# Patient Record
Sex: Female | Born: 1970 | Race: Black or African American | Hispanic: No | Marital: Married
Health system: Northeastern US, Academic
[De-identification: ages and names within clinical notes are randomized; demographics above are authoritative.]

## PROBLEM LIST (undated history)

## (undated) DIAGNOSIS — N3010 Interstitial cystitis (chronic) without hematuria: Secondary | ICD-10-CM

## (undated) DIAGNOSIS — E559 Vitamin D deficiency, unspecified: Secondary | ICD-10-CM

## (undated) DIAGNOSIS — R51 Headache: Secondary | ICD-10-CM

## (undated) DIAGNOSIS — R011 Cardiac murmur, unspecified: Secondary | ICD-10-CM

## (undated) DIAGNOSIS — D573 Sickle-cell trait: Secondary | ICD-10-CM

## (undated) DIAGNOSIS — I10 Essential (primary) hypertension: Secondary | ICD-10-CM

## (undated) HISTORY — PX: BARTHOLIN GLAND CYST EXCISION: SHX565

## (undated) HISTORY — PX: DILATION AND CURETTAGE OF UTERUS: SHX78

## (undated) HISTORY — PX: EYE SURGERY: SHX253

## (undated) HISTORY — PX: ABDOMINAL HYSTERECTOMY: SHX81

## (undated) HISTORY — PX: COLONOSCOPY: SHX174

## (undated) HISTORY — PX: CYSTECTOMY: SUR359

## (undated) HISTORY — PX: UPPER GI ENDOSCOPY: SHX6162

## (undated) HISTORY — PX: INGUINAL HERNIA REPAIR: SUR1180

## (undated) HISTORY — PX: ANKLE SURGERY: SHX546

## (undated) HISTORY — PX: LAPAROSCOPY: SHX197

---

## 1997-09-27 ENCOUNTER — Other Ambulatory Visit: Admission: RE | Admit: 1997-09-27 | Discharge: 1997-09-27 | Payer: Self-pay | Admitting: Obstetrics & Gynecology

## 1997-10-23 ENCOUNTER — Emergency Department (HOSPITAL_COMMUNITY): Admission: EM | Admit: 1997-10-23 | Discharge: 1997-10-23 | Payer: Self-pay

## 1997-11-09 ENCOUNTER — Ambulatory Visit: Admission: RE | Admit: 1997-11-09 | Discharge: 1997-11-09 | Payer: Self-pay

## 1998-01-13 ENCOUNTER — Emergency Department (HOSPITAL_COMMUNITY): Admission: EM | Admit: 1998-01-13 | Discharge: 1998-01-14 | Payer: Self-pay | Admitting: Emergency Medicine

## 1998-05-15 ENCOUNTER — Inpatient Hospital Stay (HOSPITAL_COMMUNITY): Admission: AD | Admit: 1998-05-15 | Discharge: 1998-05-15 | Payer: Self-pay | Admitting: Obstetrics

## 1998-06-06 ENCOUNTER — Ambulatory Visit (HOSPITAL_COMMUNITY): Admission: RE | Admit: 1998-06-06 | Discharge: 1998-06-06 | Payer: Self-pay | Admitting: *Deleted

## 1998-09-18 ENCOUNTER — Other Ambulatory Visit: Admission: RE | Admit: 1998-09-18 | Discharge: 1998-09-18 | Payer: Self-pay | Admitting: Obstetrics and Gynecology

## 1998-09-24 ENCOUNTER — Emergency Department (HOSPITAL_COMMUNITY): Admission: EM | Admit: 1998-09-24 | Discharge: 1998-09-24 | Payer: Self-pay | Admitting: Emergency Medicine

## 1998-10-19 ENCOUNTER — Emergency Department (HOSPITAL_COMMUNITY): Admission: EM | Admit: 1998-10-19 | Discharge: 1998-10-19 | Payer: Self-pay | Admitting: *Deleted

## 1998-11-27 ENCOUNTER — Encounter: Payer: Self-pay | Admitting: Urology

## 1998-11-27 ENCOUNTER — Ambulatory Visit (HOSPITAL_COMMUNITY): Admission: RE | Admit: 1998-11-27 | Discharge: 1998-11-27 | Payer: Self-pay | Admitting: Urology

## 1999-04-23 ENCOUNTER — Emergency Department (HOSPITAL_COMMUNITY): Admission: EM | Admit: 1999-04-23 | Discharge: 1999-04-23 | Payer: Self-pay | Admitting: Emergency Medicine

## 1999-04-23 ENCOUNTER — Encounter: Payer: Self-pay | Admitting: Emergency Medicine

## 1999-06-07 ENCOUNTER — Emergency Department (HOSPITAL_COMMUNITY): Admission: EM | Admit: 1999-06-07 | Discharge: 1999-06-07 | Payer: Self-pay | Admitting: Emergency Medicine

## 1999-07-04 ENCOUNTER — Emergency Department (HOSPITAL_COMMUNITY): Admission: EM | Admit: 1999-07-04 | Discharge: 1999-07-05 | Payer: Self-pay | Admitting: Emergency Medicine

## 1999-07-17 ENCOUNTER — Other Ambulatory Visit: Admission: RE | Admit: 1999-07-17 | Discharge: 1999-07-17 | Payer: Self-pay | Admitting: Obstetrics and Gynecology

## 1999-07-25 ENCOUNTER — Encounter: Payer: Self-pay | Admitting: Obstetrics and Gynecology

## 1999-07-25 ENCOUNTER — Ambulatory Visit (HOSPITAL_COMMUNITY): Admission: RE | Admit: 1999-07-25 | Discharge: 1999-07-25 | Payer: Self-pay | Admitting: Obstetrics and Gynecology

## 1999-11-30 ENCOUNTER — Emergency Department (HOSPITAL_COMMUNITY): Admission: EM | Admit: 1999-11-30 | Discharge: 1999-11-30 | Payer: Self-pay | Admitting: Emergency Medicine

## 1999-11-30 ENCOUNTER — Encounter: Payer: Self-pay | Admitting: Emergency Medicine

## 1999-12-09 ENCOUNTER — Ambulatory Visit (HOSPITAL_COMMUNITY): Admission: RE | Admit: 1999-12-09 | Discharge: 1999-12-09 | Payer: Self-pay | Admitting: Obstetrics and Gynecology

## 1999-12-09 ENCOUNTER — Encounter: Payer: Self-pay | Admitting: Obstetrics and Gynecology

## 1999-12-16 ENCOUNTER — Encounter: Payer: Self-pay | Admitting: Gastroenterology

## 1999-12-16 ENCOUNTER — Ambulatory Visit (HOSPITAL_COMMUNITY): Admission: RE | Admit: 1999-12-16 | Discharge: 1999-12-16 | Payer: Self-pay | Admitting: Gastroenterology

## 2000-05-18 ENCOUNTER — Encounter: Payer: Self-pay | Admitting: Obstetrics and Gynecology

## 2000-05-18 ENCOUNTER — Ambulatory Visit (HOSPITAL_COMMUNITY): Admission: RE | Admit: 2000-05-18 | Discharge: 2000-05-18 | Payer: Self-pay | Admitting: Obstetrics and Gynecology

## 2000-06-11 ENCOUNTER — Encounter: Payer: Self-pay | Admitting: Obstetrics and Gynecology

## 2000-06-11 ENCOUNTER — Ambulatory Visit (HOSPITAL_COMMUNITY): Admission: RE | Admit: 2000-06-11 | Discharge: 2000-06-11 | Payer: Self-pay | Admitting: Obstetrics and Gynecology

## 2000-08-05 ENCOUNTER — Other Ambulatory Visit: Admission: RE | Admit: 2000-08-05 | Discharge: 2000-08-05 | Payer: Self-pay | Admitting: Obstetrics and Gynecology

## 2000-12-05 ENCOUNTER — Emergency Department (HOSPITAL_COMMUNITY): Admission: EM | Admit: 2000-12-05 | Discharge: 2000-12-05 | Payer: Self-pay | Admitting: Emergency Medicine

## 2000-12-08 ENCOUNTER — Ambulatory Visit (HOSPITAL_BASED_OUTPATIENT_CLINIC_OR_DEPARTMENT_OTHER): Admission: RE | Admit: 2000-12-08 | Discharge: 2000-12-08 | Payer: Self-pay | Admitting: General Surgery

## 2000-12-08 ENCOUNTER — Encounter (INDEPENDENT_AMBULATORY_CARE_PROVIDER_SITE_OTHER): Payer: Self-pay | Admitting: *Deleted

## 2001-08-08 ENCOUNTER — Other Ambulatory Visit: Admission: RE | Admit: 2001-08-08 | Discharge: 2001-08-08 | Payer: Self-pay | Admitting: Obstetrics and Gynecology

## 2001-08-12 ENCOUNTER — Encounter: Payer: Self-pay | Admitting: Obstetrics and Gynecology

## 2001-08-12 ENCOUNTER — Ambulatory Visit (HOSPITAL_COMMUNITY): Admission: RE | Admit: 2001-08-12 | Discharge: 2001-08-12 | Payer: Self-pay | Admitting: Obstetrics and Gynecology

## 2001-10-02 ENCOUNTER — Inpatient Hospital Stay (HOSPITAL_COMMUNITY): Admission: AD | Admit: 2001-10-02 | Discharge: 2001-10-02 | Payer: Self-pay | Admitting: Obstetrics and Gynecology

## 2001-10-05 ENCOUNTER — Emergency Department (HOSPITAL_COMMUNITY): Admission: EM | Admit: 2001-10-05 | Discharge: 2001-10-06 | Payer: Self-pay | Admitting: Emergency Medicine

## 2001-11-28 ENCOUNTER — Inpatient Hospital Stay (HOSPITAL_COMMUNITY): Admission: AD | Admit: 2001-11-28 | Discharge: 2001-11-28 | Payer: Self-pay | Admitting: Family Medicine

## 2002-01-06 ENCOUNTER — Emergency Department (HOSPITAL_COMMUNITY): Admission: EM | Admit: 2002-01-06 | Discharge: 2002-01-07 | Payer: Self-pay | Admitting: Emergency Medicine

## 2002-03-27 ENCOUNTER — Inpatient Hospital Stay (HOSPITAL_COMMUNITY): Admission: AD | Admit: 2002-03-27 | Discharge: 2002-03-27 | Payer: Self-pay | Admitting: Obstetrics and Gynecology

## 2002-03-28 ENCOUNTER — Encounter: Payer: Self-pay | Admitting: Obstetrics and Gynecology

## 2002-08-15 ENCOUNTER — Other Ambulatory Visit: Admission: RE | Admit: 2002-08-15 | Discharge: 2002-08-15 | Payer: Self-pay | Admitting: Obstetrics and Gynecology

## 2002-09-07 ENCOUNTER — Inpatient Hospital Stay (HOSPITAL_COMMUNITY): Admission: AD | Admit: 2002-09-07 | Discharge: 2002-09-07 | Payer: Self-pay | Admitting: Obstetrics and Gynecology

## 2002-11-02 ENCOUNTER — Encounter: Payer: Self-pay | Admitting: Emergency Medicine

## 2002-11-02 ENCOUNTER — Emergency Department (HOSPITAL_COMMUNITY): Admission: EM | Admit: 2002-11-02 | Discharge: 2002-11-02 | Payer: Self-pay | Admitting: Emergency Medicine

## 2005-04-03 ENCOUNTER — Emergency Department (HOSPITAL_COMMUNITY): Admission: EM | Admit: 2005-04-03 | Discharge: 2005-04-03 | Payer: Self-pay | Admitting: Emergency Medicine

## 2005-04-30 ENCOUNTER — Emergency Department (HOSPITAL_COMMUNITY): Admission: EM | Admit: 2005-04-30 | Discharge: 2005-04-30 | Payer: Self-pay | Admitting: Emergency Medicine

## 2005-10-08 ENCOUNTER — Ambulatory Visit: Payer: Self-pay | Admitting: Obstetrics and Gynecology

## 2005-11-09 ENCOUNTER — Ambulatory Visit: Payer: Self-pay | Admitting: Obstetrics and Gynecology

## 2005-11-09 ENCOUNTER — Ambulatory Visit (HOSPITAL_COMMUNITY): Admission: RE | Admit: 2005-11-09 | Discharge: 2005-11-09 | Payer: Self-pay | Admitting: Obstetrics and Gynecology

## 2005-11-12 ENCOUNTER — Inpatient Hospital Stay (HOSPITAL_COMMUNITY): Admission: AD | Admit: 2005-11-12 | Discharge: 2005-11-12 | Payer: Self-pay | Admitting: Obstetrics and Gynecology

## 2005-11-26 ENCOUNTER — Ambulatory Visit: Payer: Self-pay | Admitting: Obstetrics and Gynecology

## 2005-12-30 ENCOUNTER — Emergency Department (HOSPITAL_COMMUNITY): Admission: EM | Admit: 2005-12-30 | Discharge: 2005-12-30 | Payer: Self-pay | Admitting: Emergency Medicine

## 2006-01-13 ENCOUNTER — Emergency Department (HOSPITAL_COMMUNITY): Admission: EM | Admit: 2006-01-13 | Discharge: 2006-01-13 | Payer: Self-pay | Admitting: Emergency Medicine

## 2006-01-14 ENCOUNTER — Emergency Department (HOSPITAL_COMMUNITY): Admission: EM | Admit: 2006-01-14 | Discharge: 2006-01-14 | Payer: Self-pay | Admitting: Emergency Medicine

## 2006-05-01 ENCOUNTER — Emergency Department (HOSPITAL_COMMUNITY): Admission: EM | Admit: 2006-05-01 | Discharge: 2006-05-01 | Payer: Self-pay | Admitting: Family Medicine

## 2006-08-09 ENCOUNTER — Emergency Department (HOSPITAL_COMMUNITY): Admission: EM | Admit: 2006-08-09 | Discharge: 2006-08-09 | Payer: Self-pay | Admitting: Emergency Medicine

## 2007-01-08 ENCOUNTER — Inpatient Hospital Stay (HOSPITAL_COMMUNITY): Admission: AD | Admit: 2007-01-08 | Discharge: 2007-01-08 | Payer: Self-pay | Admitting: Obstetrics & Gynecology

## 2007-02-07 ENCOUNTER — Ambulatory Visit (HOSPITAL_BASED_OUTPATIENT_CLINIC_OR_DEPARTMENT_OTHER): Admission: RE | Admit: 2007-02-07 | Discharge: 2007-02-07 | Payer: Self-pay | Admitting: Ophthalmology

## 2007-02-07 ENCOUNTER — Emergency Department (HOSPITAL_COMMUNITY): Admission: EM | Admit: 2007-02-07 | Discharge: 2007-02-07 | Payer: Self-pay | Admitting: Family Medicine

## 2007-03-26 ENCOUNTER — Emergency Department (HOSPITAL_COMMUNITY): Admission: EM | Admit: 2007-03-26 | Discharge: 2007-03-26 | Payer: Self-pay | Admitting: Emergency Medicine

## 2007-08-01 ENCOUNTER — Encounter: Admission: RE | Admit: 2007-08-01 | Discharge: 2007-08-01 | Payer: Self-pay | Admitting: Gastroenterology

## 2007-10-12 ENCOUNTER — Encounter (INDEPENDENT_AMBULATORY_CARE_PROVIDER_SITE_OTHER): Payer: Self-pay | Admitting: Obstetrics and Gynecology

## 2007-10-12 ENCOUNTER — Ambulatory Visit (HOSPITAL_COMMUNITY): Admission: RE | Admit: 2007-10-12 | Discharge: 2007-10-13 | Payer: Self-pay | Admitting: Obstetrics and Gynecology

## 2007-12-20 ENCOUNTER — Emergency Department (HOSPITAL_COMMUNITY): Admission: EM | Admit: 2007-12-20 | Discharge: 2007-12-20 | Payer: Self-pay | Admitting: Family Medicine

## 2008-06-15 ENCOUNTER — Emergency Department (HOSPITAL_COMMUNITY): Admission: EM | Admit: 2008-06-15 | Discharge: 2008-06-15 | Payer: Self-pay | Admitting: Emergency Medicine

## 2008-07-18 ENCOUNTER — Emergency Department (HOSPITAL_COMMUNITY): Admission: EM | Admit: 2008-07-18 | Discharge: 2008-07-18 | Payer: Self-pay | Admitting: Emergency Medicine

## 2008-08-27 ENCOUNTER — Inpatient Hospital Stay (HOSPITAL_COMMUNITY): Admission: AD | Admit: 2008-08-27 | Discharge: 2008-08-27 | Payer: Self-pay | Admitting: Obstetrics & Gynecology

## 2008-09-17 ENCOUNTER — Emergency Department (HOSPITAL_COMMUNITY): Admission: EM | Admit: 2008-09-17 | Discharge: 2008-09-17 | Payer: Self-pay | Admitting: Emergency Medicine

## 2009-01-11 ENCOUNTER — Emergency Department (HOSPITAL_COMMUNITY): Admission: EM | Admit: 2009-01-11 | Discharge: 2009-01-11 | Payer: Self-pay | Admitting: Emergency Medicine

## 2009-05-05 ENCOUNTER — Emergency Department (HOSPITAL_COMMUNITY): Admission: EM | Admit: 2009-05-05 | Discharge: 2009-05-05 | Payer: Self-pay | Admitting: Emergency Medicine

## 2009-08-26 ENCOUNTER — Emergency Department (HOSPITAL_COMMUNITY): Admission: EM | Admit: 2009-08-26 | Discharge: 2009-08-26 | Payer: Self-pay | Admitting: Emergency Medicine

## 2009-09-10 ENCOUNTER — Other Ambulatory Visit: Admission: RE | Admit: 2009-09-10 | Discharge: 2009-09-10 | Payer: Self-pay | Admitting: Family Medicine

## 2009-11-05 ENCOUNTER — Ambulatory Visit: Payer: Self-pay | Admitting: Obstetrics and Gynecology

## 2009-11-05 ENCOUNTER — Inpatient Hospital Stay (HOSPITAL_COMMUNITY): Admission: AD | Admit: 2009-11-05 | Discharge: 2009-11-05 | Payer: Self-pay | Admitting: Obstetrics & Gynecology

## 2009-12-11 ENCOUNTER — Ambulatory Visit: Payer: Self-pay | Admitting: Obstetrics & Gynecology

## 2010-01-07 ENCOUNTER — Emergency Department (HOSPITAL_COMMUNITY): Admission: EM | Admit: 2010-01-07 | Discharge: 2010-01-07 | Payer: Self-pay | Admitting: Emergency Medicine

## 2010-01-30 ENCOUNTER — Encounter (INDEPENDENT_AMBULATORY_CARE_PROVIDER_SITE_OTHER): Payer: Self-pay | Admitting: *Deleted

## 2010-01-30 ENCOUNTER — Ambulatory Visit: Payer: Self-pay | Admitting: Obstetrics & Gynecology

## 2010-01-30 LAB — CONVERTED CEMR LAB: HCV Ab: NEGATIVE

## 2010-01-31 ENCOUNTER — Encounter: Payer: Self-pay | Admitting: Obstetrics & Gynecology

## 2010-01-31 LAB — CONVERTED CEMR LAB
Clue Cells Wet Prep HPF POC: NONE SEEN
Trich, Wet Prep: NONE SEEN

## 2010-06-01 ENCOUNTER — Emergency Department (HOSPITAL_COMMUNITY)
Admission: EM | Admit: 2010-06-01 | Discharge: 2010-06-01 | Disposition: A | Discharge status: Home / Self Care | Payer: BC Managed Care – PPO | Attending: Emergency Medicine | Admitting: Emergency Medicine

## 2010-06-01 DIAGNOSIS — J069 Acute upper respiratory infection, unspecified: Secondary | ICD-10-CM | POA: Insufficient documentation

## 2010-06-01 DIAGNOSIS — J45909 Unspecified asthma, uncomplicated: Secondary | ICD-10-CM | POA: Insufficient documentation

## 2010-06-14 ENCOUNTER — Emergency Department (HOSPITAL_COMMUNITY)
Admission: EM | Admit: 2010-06-14 | Discharge: 2010-06-14 | Disposition: A | Discharge status: Home / Self Care | Payer: BC Managed Care – PPO | Attending: Emergency Medicine | Admitting: Emergency Medicine

## 2010-06-14 ENCOUNTER — Emergency Department (HOSPITAL_COMMUNITY): Payer: BC Managed Care – PPO

## 2010-06-14 DIAGNOSIS — R05 Cough: Secondary | ICD-10-CM | POA: Insufficient documentation

## 2010-06-14 DIAGNOSIS — J069 Acute upper respiratory infection, unspecified: Secondary | ICD-10-CM | POA: Insufficient documentation

## 2010-06-14 DIAGNOSIS — K589 Irritable bowel syndrome without diarrhea: Secondary | ICD-10-CM | POA: Insufficient documentation

## 2010-06-14 DIAGNOSIS — R059 Cough, unspecified: Secondary | ICD-10-CM | POA: Insufficient documentation

## 2010-06-14 DIAGNOSIS — R0602 Shortness of breath: Secondary | ICD-10-CM | POA: Insufficient documentation

## 2010-06-14 DIAGNOSIS — J45909 Unspecified asthma, uncomplicated: Secondary | ICD-10-CM | POA: Insufficient documentation

## 2010-06-14 DIAGNOSIS — R079 Chest pain, unspecified: Secondary | ICD-10-CM | POA: Insufficient documentation

## 2010-06-14 DIAGNOSIS — R002 Palpitations: Secondary | ICD-10-CM | POA: Insufficient documentation

## 2010-06-14 DIAGNOSIS — R0609 Other forms of dyspnea: Secondary | ICD-10-CM | POA: Insufficient documentation

## 2010-06-14 DIAGNOSIS — R0989 Other specified symptoms and signs involving the circulatory and respiratory systems: Secondary | ICD-10-CM | POA: Insufficient documentation

## 2010-06-14 DIAGNOSIS — J3489 Other specified disorders of nose and nasal sinuses: Secondary | ICD-10-CM | POA: Insufficient documentation

## 2010-07-03 LAB — POCT CARDIAC MARKERS: Troponin i, poc: <0.05 ng/mL (ref 0.00–0.09)

## 2010-07-03 LAB — URINALYSIS, ROUTINE W REFLEX MICROSCOPIC
Bilirubin Urine: NEGATIVE
Glucose, UA: NEGATIVE mg/dL
Ketones, ur: NEGATIVE mg/dL
Nitrite: NEGATIVE

## 2010-07-03 LAB — CBC
MCH: 30.3 pg (ref 26.0–34.0)
MCHC: 34.3 g/dL (ref 30.0–36.0)
Platelets: 240 10*3/uL (ref 150–400)
RBC: 4.85 MIL/uL (ref 3.87–5.11)
WBC: 9.9 10*3/uL (ref 4.0–10.5)

## 2010-07-03 LAB — DIFFERENTIAL
Basophils Absolute: 0.1 10*3/uL (ref 0.0–0.1)
Lymphocytes Relative: 34 % (ref 12–46)
Lymphs Abs: 3.4 10*3/uL (ref 0.7–4.0)
Monocytes Absolute: 0.8 10*3/uL (ref 0.1–1.0)
Neutrophils Relative %: 56 % (ref 43–77)

## 2010-07-03 LAB — BASIC METABOLIC PANEL
BUN: 11 mg/dL (ref 6–23)
Chloride: 106 mEq/L (ref 96–112)
GFR calc non Af Amer: >60 mL/min (ref >60)
Glucose, Bld: 93 mg/dL (ref 70–99)
Potassium: 4.1 mEq/L (ref 3.5–5.1)

## 2010-07-05 LAB — WET PREP, GENITAL

## 2010-07-05 LAB — GC/CHLAMYDIA PROBE AMP, GENITAL: Chlamydia, DNA Probe: NEGATIVE

## 2010-07-05 LAB — URINALYSIS, ROUTINE W REFLEX MICROSCOPIC
Bilirubin Urine: NEGATIVE
Ketones, ur: NEGATIVE mg/dL
Protein, ur: NEGATIVE mg/dL
pH: 5.5 (ref 5.0–8.0)

## 2010-07-29 LAB — URINALYSIS, ROUTINE W REFLEX MICROSCOPIC
Glucose, UA: NEGATIVE mg/dL
Ketones, ur: NEGATIVE mg/dL
Protein, ur: NEGATIVE mg/dL
Specific Gravity, Urine: 1.01 (ref 1.005–1.030)
Urobilinogen, UA: 0.2 mg/dL (ref 0.0–1.0)

## 2010-07-29 LAB — DIFFERENTIAL
Basophils Absolute: 0 10*3/uL (ref 0.0–0.1)
Basophils Relative: 1 % (ref 0–1)
Lymphocytes Relative: 38 % (ref 12–46)
Monocytes Absolute: 0.7 10*3/uL (ref 0.1–1.0)
Neutro Abs: 4.4 10*3/uL (ref 1.7–7.7)
Neutrophils Relative %: 52 % (ref 43–77)

## 2010-07-29 LAB — CBC
HCT: 36.7 % (ref 36.0–46.0)
Hemoglobin: 12.8 g/dL (ref 12.0–15.0)
MCV: 89.4 fL (ref 78.0–100.0)
Platelets: 241 10*3/uL (ref 150–400)
RDW: 13.9 % (ref 11.5–15.5)

## 2010-07-29 LAB — COMPREHENSIVE METABOLIC PANEL
Albumin: 3.7 g/dL (ref 3.5–5.2)
Alkaline Phosphatase: 88 U/L (ref 39–117)
BUN: 10 mg/dL (ref 6–23)
Chloride: 107 mEq/L (ref 96–112)
Creatinine, Ser: 0.7 mg/dL (ref 0.4–1.2)
Glucose, Bld: 101 mg/dL — ABNORMAL HIGH (ref 70–99)
Total Bilirubin: 0.7 mg/dL (ref 0.3–1.2)
Total Protein: 6.4 g/dL (ref 6.0–8.3)

## 2010-07-29 LAB — URINE CULTURE: Colony Count: 15000

## 2010-07-29 LAB — WET PREP, GENITAL: Clue Cells Wet Prep HPF POC: NONE SEEN

## 2010-07-29 LAB — GC/CHLAMYDIA PROBE AMP, GENITAL: GC Probe Amp, Genital: NEGATIVE

## 2010-09-02 NOTE — Op Note (Signed)
Dawn Atkinson, Dawn Atkinson               ACCOUNT NO.:  [id number]   MEDICAL RECORD NO.:  [id number]          PATIENT TYPE:  OIB   LOCATION:  9305                          FACILITY:  [facility]   PHYSICIAN:  Zenaida Niece, M.D.DATE OF BIRTH:  October 05, 1970   DATE OF PROCEDURE:  DATE OF DISCHARGE:                               OPERATIVE REPORT   PREOPERATIVE DIAGNOSES:  Recurrent pelvic pain.   POSTOPERATIVE DIAGNOSES:  Recurrent pelvic pain.   PROCEDURES:  Laparoscopic-assisted vaginal hysterectomy and cystoscopy.   SURGEON:  Zenaida Niece, MD   ASSISTANT:  Sherron Monday, MD   ANESTHESIA:  General endotracheal tube.   FINDINGS:  She had a normal upper abdomen and normal appendix.  She had  a small uterus that appeared normal.  She had normal tubes and ovaries  except for some filmy adhesions of the ovary to the pelvic sidewalls.   SPECIMENS:  Uterus sent to pathology.   ESTIMATED BLOOD LOSS:  100 mL.   COMPLICATIONS:  None.   PROCEDURE IN DETAIL:  The patient was taken to the operating room and  placed in the dorsal supine position.  General anesthesia was induced,  and she was placed in mobile stirrups.  Abdomen, perineum, and vagina  were then prepped and draped in the usual sterile fashion, bladder  drained with a red Robinson catheter, and a Hulka tenaculum applied to  the cervix for uterine manipulation.  Infraumbilical skin was then  infiltrated with 0.25% Marcaine, and a three-quarter centimeter  horizontal incision was made in her previous scar.  The Veress needle  was inserted into the peritoneal cavity and placement confirmed by the  water drop test and an opening pressure of 7 mmHg.  CO2 gas was  insufflated to a pressure of 14 mmHg, and the Veress needle was removed.  A 5-mm disposable bladeless trocar was then introduced with direct  visualization with the laparoscope.  Good placement was achieved.  5-mm  ports were then placed bilaterally also with direct  visualization.  Inspection revealed the above-mentioned findings.  Using the Harmonic  scalpel ACE first on the right side, the fallopian tube, utero-ovarian  ligament, round ligament, and broad ligaments were taken down with good  hemostasis.  The anterior leaf of the peritoneum was incised across the  anterior portion of the uterus to start pushing the bladder inferior.  A  similar procedure was then performed on the left side also using the  Harmonic scalpel.  Ovarian adhesions were then freed up bilaterally,  again using the Harmonic scalpel.  This achieved good ovarian mobility  bilaterally.  No further source for her pain was identified.  The uterus  was at this point freed to just above the uterine arteries.  Attention  was turned vaginally.  The laparoscopic instruments were removed, and  she was draped and legs were elevated in the stirrups.  The Hulka  tenaculum was removed.  A weighted speculum was inserted into the  vagina, and Deaver retractors used anteriorly and laterally.  The cervix  was grasped with Christella Hartigan tenaculums.  The cervicovaginal mucosa was  infiltrated with  a dilute solution of Pitressin and then incised  circumferentially with electrocautery.  Sharp dissection was then used  to start pushing the vagina further off the cervix.  The anterior  peritoneum was pushed off, identified, entered sharply, and the Deaver  retractor used to retract the bladder anterior.  Posterior cul-de-sac  was then easily entered and a bonnano speculum placed into the posterior  cul-de-sac.  Uterosacral ligaments were clamped, transected, and ligated  on each side with #1 chromic and tagged for later use.  Uterine arteries  were then clamped, transected, and ligated on each side, and a small  pedicle remaining on the patient's right side was taken down with  another clamp and the uterus was removed.  All pedicles were inspected.  Small amount of bleeding from the patient's right side was  controlled  with two figure-of-eight sutures of #1 chromic.  The remaining pedicles  were hemostatic.  Uterosacral ligaments were then plicated in the  midline with 2-0 silk, and the previously tagged uterosacral pedicles  were also tied in the midline.  The vagina was then closed in a vertical  fashion with running locking 2-0 Vicryl with adequate closure and  adequate hemostasis.   Attention was returned to cystoscopy.  The patient was given indigo  carmine IV.  A 70-degree cystoscope was inserted and 200 mL of sterile  solution was instilled.  The bladder appeared normal.  Both ureteral  orifices were identified, and blue-tinted urine was seen to efflux  freely from each side.  The bladder appeared normal.  The cystoscope was  removed, and a Foley catheter was placed.   Attention was turned back abdominally.  Both Dr. Ellyn Hack and myself and  the scrub tech changed gloves.  The abdomen was reinsufflated with CO2  gas and the legs were lowered.  The pelvis was inspected and copiously  irrigated.  All pedicles were found to be hemostatic.  The lateral ports  were then removed under direct visualization.  The umbilical port was  removed after all gas was allowed to deflate from the abdomen.  Skin  incisions were closed with interrupted subcuticular sutures of 4-0  Vicryl followed by Dermabond.  The patient was then taken down from  stirrups.  She was extubated in the operating room and taken to the  recovery room in stable condition after tolerating the procedure well.  Counts were correct x2, she was given Ancef 1 g IV prior to the  procedure and had PAS hose on throughout the procedure.      Zenaida Niece, M.D.  Electronically Signed     TDM/MEDQ  D:  10/12/2007  T:  10/13/2007  Job:  [id number]

## 2010-09-02 NOTE — H&P (Signed)
Dawn Atkinson, KERCE               ACCOUNT NO.:  [id number]   MEDICAL RECORD NO.:  [id number]          PATIENT TYPE:  AMB   LOCATION:  SDC                           [facility]ILITY:  [facility]   PHYSICIAN:  Zenaida Niece, M.D.DATE OF BIRTH:  11/08/1970   DATE OF ADMISSION:  10/12/2007  DATE OF DISCHARGE:                              HISTORY & PHYSICAL   CHIEF COMPLAINT:  Recurrent pelvic pain.   HISTORY OF PRESENT ILLNESS:  This is a 40 year old female, gravida 3,  para 1-0-2-1, whom I saw for an annual exam in November 2008.  At that  time, she had been on Depo-Provera and had no bleeding for approximately  1 year.  She was having monthly cramping.  She was sexually active, with  occasional discomfort.  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  We discussed that this was  difficult to explain the cramping while she was on Depo-Provera.  We  discussed all nonsurgical and surgical options.  I saw her back in April  2009.  At that time, she was stating she had pelvic pain again for  approximately 1 month on the left greater than the right, and it  radiates to her groin and legs.  She has had some spotting on the Depo-  Provera.  She also at that time had some urinary urgency as well as  discharge and itching for approximately 1 week.  She had had evaluation  by a primary physician and had a barium enema done for abdominal  bloating, and this was normal.  Exam at that time revealed slightly  tender bilateral lower quadrants, on the left greater than the right.  She had an Affirm done for her discharge, and this was positive for  bacterial vaginosis and yeast, which were both treated.  Again, all  options have been discussed with the patient, and she wants to proceed  with definitive surgical therapy and is being admitted for a  hysterectomy at this time.   PAST OB HISTORY:  One vaginal delivery at 8 months, complicated by  preterm labor and  first-trimester bleeding. Baby weighed 5 pounds.  One  spontaneous abortion, and one elective termination.   GYNECOLOGIC HISTORY:  She has a remote history of an abnormal Pap smear  with colposcopy and biopsy, with normal follow-up Pap smears.  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   PAST MEDICAL HISTORY:  1. History of interstitial cystitis.  2. Allergies, which cause asthma.  3. Hemoglobin AS.  4. Right kidney with papillary necrosis.   PAST SURGICAL HISTORY:  1. Significant for the above mentioned laparoscopy.  2. Cystoscopy.  3. Bilateral inguinal hernia repair.  4. Surgery for eye styes.   ALLERGIES:  1. ASPIRIN.  2. SULFA.  3. OXYCONTIN.  4. OXYCODONE.  5. MORPHINE   CURRENT MEDICATIONS:  1. Astelin.  2. Erythromycin eye drops.  3. She also took Diflucan and Flagyl for yeast and bacterial  vaginosis.   SOCIAL HISTORY:  She does smoke a pack of cigarettes a day, and denies  alcohol or drug use.   FAMILY HISTORY:  Sister possibly with ovarian cancer, and possibly a  maternal great aunt with colon cancer.   REVIEW OF SYSTEMS:  Some urinary urgency, and occasional constipation.  No chest pain or shortness of breath.   PHYSICAL EXAMINATION:  GENERAL:  This is a well-developed female, in no  acute distress.  VITAL SIGNS:  Weight is 140 pounds, blood pressure is normal.  NECK:  Supple, without lymphadenopathy or thyromegaly.  LUNGS:  Clear to auscultation.  HEART:  Regular rate and rhythm, without murmur.  ABDOMEN:  Soft, nondistended, without palpable masses.  Again, she is  slightly tender in both lower quadrants, on the left greater than the  right.  EXTREMITIES:  Have no edema and are nontender.  PELVIC:  External genitalia have no lesions.  On speculum exam, the  cervix is normal, and Pap smear performed in November 2008 is normal.  On bimanual  exam, there are no masses, although she is slightly tender  on both sides, again on the left greater than the right.  She has a  small retroverted to midplanar nontender uterus.   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  The patient has been on Depo-  Provera and is having recurrent symptoms.  All nonsurgical and surgical  options have been discussed with the patient.  The patient wishes to  proceed with definitive surgical therapy with hysterectomy.  All risks  of surgery have been discussed, and she understands.   PLAN:  Admit the patient on the day of surgery for laparoscopic-assisted  vaginal hysterectomy, possible bilateral salpingo-oophorectomy, and  cystoscopy.      Zenaida Niece, M.D.  Electronically Signed     TDM/MEDQ  D:  10/11/2007  T:  10/11/2007  Job:  [id number]

## 2010-09-05 NOTE — Op Note (Signed)
[name], [name]               ACCOUNT NO.:  [id number]   MEDICAL RECORD NO.:  [id number]          PATIENT TYPE:  AMB   LOCATION:  SDC                           [facility]   PHYSICIAN:  Phil D. Okey Dupre, M.D.     DATE OF BIRTH:  07-16-1970   DATE OF PROCEDURE:  11/09/2005  DATE OF DISCHARGE:                                 OPERATIVE REPORT   PROCEDURE:  Diagnostic laparoscopy.   PREOPERATIVE DIAGNOSIS:  Chronic pelvic pain, probable endometriosis.   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   SURGEON:  Dr. Okey Dupre.   ANESTHESIA:  General.   SPECIMENS TO PATHOLOGY:  None.   POSTOPERATIVE CONDITION:  Satisfactory.   BLOOD LOSS:  Minimal.   OPERATIVE FINDINGS:  Bimanual pelvic examination revealed the uterus of  normal size, shape, consistency with normal adnexa and a free cul-de-sac.  On the laparoscopic exam, there was marked vascularity on the perineum in  the lower uterine segment and a bulging there was soft in consistency and  probably a small leiomyomata uteri.  Behind the broad ligaments on either  side around the ovaries which by the way were normal, there were large  collections of torturous varicose veins and blanching material, probably  representing pelvic congestion syndrome.  The ovaries as well as the  appendix appeared completely normal, although the ovary on the left side was  involved in some filmy adhesions, probably secondary to her previous ovarian  cystectomy.  Other than that, there were no injury or pathological findings  within the peritoneal cavity.   PROCEDURE NOTE:  The procedure went as follows:  Under satisfactory general  anesthesia with the patient in dorsal semilithotomy position, the perineum,  vagina and abdomen were prepped and draped in usual sterile manner.  Bladder  was emptied.  The anterior lip of the cervix grasped with a single-tooth  tenaculum and an intrauterine  probe attached to tenaculum where it connected  to this anterior portion of cervix for uterine manipulation.  Veress needle  inserted in the peritoneal cavity at the lower pole of the umbilicus.  Approximately 3 liters carbon dioxide was slowly insufflated in the  peritoneal cavity.  Equal tympany occurred over the entire abdominal wall.  The Veress needle was removed.  A 1-cm transverse incision made just below  the umbilicus.  The laparoscope and trocar inserted into the peritoneal  cavity.  Trocar removed.  The sleeve laparoscope inserted.  The findings  were as aforementioned.  Scope was then removed.  As much CO2 as possible  expressed through the sleeve, the sleeve removed.  The incision closed with  a 2-0 Vicryl figure-of-eight suture to close the peritoneum and fascia.  The  skin edges fell together and were closed with epiderm.  Dry sterile dressing  was applied.  Tenaculums were removed from the vagina.  The patient  transferred to the recovery in satisfactory condition, having tolerated the  procedure  with minimal blood loss.  I think the plan for this lady and I have  discussed with the  mother would be probably a laparoscopic-assisted vaginal  hysterectomy with bilateral salpingo-oophorectomy.  I am not sure that she  would get the desired relief by leaving the ovaries, but we will discuss  this in more detail with the patient when seen.           ______________________________  Javier Glazier Okey Dupre, M.D.     PDR/MEDQ  D:  11/09/2005  T:  11/09/2005  Job:  [id number]

## 2010-09-05 NOTE — Op Note (Signed)
[place]  Patient:    MERARI, PION Visit Number: [id number] MRN: [id number]          Service Type: DSU Location: [facility] Attending Physician:  Glenna Fellows [place] Proc. Date: 12/08/00 Adm. Date:  12/08/2000                             Operative Report  PREOPERATIVE DIAGNOSIS:  Hemorrhoids and history of anal fissure.  POSTOPERATIVE DIAGNOSIS:  Hemorrhoids and history of anal fissure.  OPERATION PERFORMED:  Hemorrhoidectomy with lateral internal anal sphincterotomy.  SURGEON:  Lorne Skeens. Hoxworth, M.D.  ANESTHESIA:  General.  INDICATIONS FOR PROCEDURE:  The patient is a 40 year old black female with a history of mild hemorrhoid symptoms in the past, who presented to the emergency room  in the last week with an acute thrombosed hemorrhoid.  Attempt at lancing was performed.  She presented to my office yesterday with severe pain and swelling of a large thrombosed left lateral external hemorrhoid.  She also gives a history of anal fissures in the past.  Hemorrhoidectomy under general anesthesia with internal anal sphincterotomy has been recommended and accepted.  The nature of the procedure, its indications and risks of bleeding and infection were discussed and understood preoperatively.  She is now brought to the operating room for this procedure.  DESCRIPTION OF PROCEDURE:  The patient was brought to the operating room and placed in supine position on the operating table and laryngeal mask general anesthesia was induced.  She was given antibiotics intravenously.  She was carefully positioned in lithotomy position with the perineum sterilely prepped and draped.  Examination of the anorectal area revealed a larger thrombosed external hemorrhoid in the left lateral position.  There were some minimal hemorrhoids otherwise.  There was no active fissure at this time.  Initially, Marcaine with epinephrine was injected at the apex of the  hemorrhoid in the left lateral position and a small stab incision made here and the intersphincteric groove easily palpated.  A hemostat was placed in the groove and the internal sphincter elevated up into the small incision and divided with cautery.  Following this, a 3-0 chromic suture was placed at this area t the apex of the hemorrhoid and the large thrombosed hemorrhoid was elliptically excised down to but carefully protecting the sphincters.  The defect was then closed with the chromic suture in a running locking fashion. There was no further significant disease remaining at this point.  The perianal area was infiltrated with Marcaine.  Sponge, needle and instrument counts were correct.  Gauze dressing was applied.  The patient was taken to the recovery room in good condition. Attending Physician:  Delsa Bern DD:  12/08/00 TD:  12/08/00 Job: [id number]

## 2010-09-05 NOTE — Op Note (Signed)
NAMEWYNDI, NORTHRUP               ACCOUNT NO.:  [id number]   MEDICAL RECORD NO.:  [id number]          PATIENT TYPE:  EMS   LOCATION:  URG                          FACILITY:  [facility]   PHYSICIAN:  Salley Scarlet., M.D.DATE OF BIRTH:  [birth year]   DATE OF PROCEDURE:  02/12/2007  DATE OF DISCHARGE:  02/07/2007                               OPERATIVE REPORT   PREOPERATIVE DIAGNOSES:  1. Multiple chalazia, upper lid, left eye.  2. Chalazion, lower lid, left eye.   POSTOPERATIVE DIAGNOSES:  1. Multiple chalazia, upper lid, left eye.  2. Chalazion, lower lid, left eye.   OPERATION/PROCEDURE:  1. Excision multiple chalazia, upper lid, left eye.  2. Excision chalazion, lower lid, left eye.   ANESTHESIA:  Local Xylocaine 1%.   PROCEDURE:  The upper lid of the left eye was inspected and there was  found to be 3 individually loculated lesions along the medial aspect of  the upper lid of the left side.  Then there was a larger lesion located  along the lower lid of the left eye also medially.  The upper and lower  lids were infiltrated 7 mL Xylocaine.  The lid was then prepped and  draped in the usual manner.  Chalazion clamp was applied over the lesion  lower lid.  A cruciate incision was made in the tarsus over the lesion.  The lesion was curetted using chalasia curette.  The site was excised in  total using sharp and blunt dissection.  Then the chalazion clamp was  applied along the larger lesion of the upper lid of the left eye, was  located medially.  A cruciate incision made in the tarsal of the lesion  and the lesion was curetted using combination curette.  This site was  excised in total using sharp and blunt dissection.  The same procedure  was presented for each of the two smaller lesions.  At the end of the  procedure, Polysporin ophthalmic ointment and a pressure patch were  applied.  The patient tolerated the procedure well and discharged to the  post anesthesia recovery  room in satisfactory condition.  She is  instructed to take Vicodin every 4 hours as needed for pain and to see  me in office in 1 week for further evaluation.   DISCHARGE DIAGNOSES:  1  Multiple chalazia, upper lid, left eye.  1. Chalazion, lower lid, left eye.      Salley Scarlet., M.D.  Electronically Signed     TB/MEDQ  D:  02/12/2007  T:  02/13/2007  Job:  [id number]

## 2010-09-05 NOTE — Group Therapy Note (Signed)
Dawn Atkinson, SCRIVNER NO.:  [id number]   MEDICAL RECORD NO.:  [id number]          PATIENT TYPE:  WOC   LOCATION:  [facility]                   FACILITY:  [facility]   PHYSICIAN:  Argentina Donovan, MD        DATE OF BIRTH:  06-Apr-1971   DATE OF SERVICE:  10/08/2005                                    CLINIC NOTE   The patient is a 40 year old African-American female gravida 4, para 0-1-3-  38, with a 25 year old child who has had a history of endometriosis diagnosed  when she had an ovarian cyst removed.  Also had been treated for  interstitial cystitis after she had some complications to the kidneys  secondary to NSAIDS and has been referred by the health department because  of chronic pelvic pain.  She said that she does not take any medication  anymore for interstitial cystitis and has not been bothered by that, but she  began having exacerbation of the chronic pelvic pain about 6 months ago that  has gotten worse.  She was placed on Depo-Provera without help and we  discussed the possibility of hysterectomy as well as conservative therapy  and since this is a very unusual story for endometriosis, I thought it would  be before we plan anything radical, that we do a diagnostic laparoscopy.  She apparently has had normal ultrasounds in the past.  We are going to  schedule her for that.  She has an allergy to aspirin and sulfa.  She is on  vitamins mainly and has a history of surgery on her foot as well as  hemorrhoidectomy and the diagnosis is chronic pelvic pain, history of  endometriosis, and interstitial cystitis.           ______________________________  Argentina Donovan, MD     PR/MEDQ  D:  10/08/2005  T:  10/08/2005  Job:  [id number]

## 2010-10-13 ENCOUNTER — Ambulatory Visit: Payer: BC Managed Care – PPO

## 2010-10-13 ENCOUNTER — Other Ambulatory Visit: Payer: Self-pay | Admitting: Family Medicine

## 2010-10-13 ENCOUNTER — Ambulatory Visit (INDEPENDENT_AMBULATORY_CARE_PROVIDER_SITE_OTHER): Payer: Self-pay

## 2010-10-13 ENCOUNTER — Ambulatory Visit (HOSPITAL_COMMUNITY)
Admission: RE | Admit: 2010-10-13 | Discharge: 2010-10-13 | Disposition: A | Discharge status: Home / Self Care | Payer: Self-pay | Source: Ambulatory Visit | Attending: Family Medicine | Admitting: Family Medicine

## 2010-10-13 DIAGNOSIS — N949 Unspecified condition associated with female genital organs and menstrual cycle: Secondary | ICD-10-CM | POA: Insufficient documentation

## 2010-10-13 DIAGNOSIS — R102 Pelvic and perineal pain: Secondary | ICD-10-CM

## 2010-10-13 LAB — POCT URINALYSIS DIP (DEVICE)
Glucose, UA: NEGATIVE mg/dL
Hgb urine dipstick: NEGATIVE
Nitrite: NEGATIVE
Protein, ur: NEGATIVE mg/dL
Specific Gravity, Urine: >=1.03 (ref 1.005–1.030)
Urobilinogen, UA: 1 mg/dL (ref 0.0–1.0)
pH: 5.5 (ref 5.0–8.0)

## 2010-10-14 NOTE — Group Therapy Note (Signed)
NAMETELENA, PEYSER               ACCOUNT NO.:  [id number]  MEDICAL RECORD NO.:  [id number]           PATIENT TYPE:  O  LOCATION:  [facility]                    [facility]  PHYSICIAN:  Tinnie Gens, MD        DATE OF BIRTH:  May 14, 1970  DATE OF SERVICE:  10/13/2010                                 CLINIC NOTE  CHIEF COMPLAINT:  Lower abdominal and back pain.  HISTORY OF PRESENT ILLNESS:  The patient is a 40 year old gravida 1, para 1 who is status post hysterectomy for pelvic pain.  She also has a history of ovarian cyst and interstitial cystitis.  She reports several- day history of increasing lower abdominal pain, pressure, and back pain. She notes some stinging when she urinates and some vaginal discharge. It is unclear if the two are related.  She denies fever or chills.  She reports achiness.  Her pain is 7/10.  She tried ibuprofen and Aleve which have not really helped her period.  PHYSICAL EXAMINATION:  VITAL SIGNS:  On exam, her vitals are as noted in the chart. GENERAL:  She is a well-developed, well-nourished, female in no acute distress. ABDOMEN:  Soft and mildly tender.  She has no CVA tenderness. GU:  Normal external female genitalia.  BUS is normal.  Vagina is pink and rugated with adherent white discharge.  The uterus and cervix are absent.  She is tender of the left adnexa, and mass could not really be appreciated over there, but it was somewhat limited due to patient guarding.  IMPRESSION: 1. Vaginal discharge. 2. Fairly significant pelvic pain.  PLAN: 1. Ultrasound today. 2. Ultram 50 mg p.o. q.6 hours p.r.n. given allergy to hydrocodone or     oxycodone. 3. We will give her a prescription for Diflucan 150 mg p.o. x1 and     repeat in 24 hours as needed #2, two refills.  4.  We will have the     patient return to this office in approximately 2 weeks.          ______________________________ Tinnie Gens, MD    TP/MEDQ  D:  10/13/2010  T:   10/14/2010  Job:  [id number]

## 2010-10-18 DIAGNOSIS — J45909 Unspecified asthma, uncomplicated: Secondary | ICD-10-CM | POA: Insufficient documentation

## 2010-10-18 DIAGNOSIS — K5900 Constipation, unspecified: Secondary | ICD-10-CM | POA: Insufficient documentation

## 2010-10-18 DIAGNOSIS — N3010 Interstitial cystitis (chronic) without hematuria: Secondary | ICD-10-CM

## 2010-10-18 DIAGNOSIS — Z90710 Acquired absence of both cervix and uterus: Secondary | ICD-10-CM

## 2010-11-06 ENCOUNTER — Ambulatory Visit: Payer: Self-pay | Admitting: Obstetrics and Gynecology

## 2010-12-07 ENCOUNTER — Emergency Department (HOSPITAL_COMMUNITY)
Admission: EM | Admit: 2010-12-07 | Discharge: 2010-12-07 | Disposition: A | Discharge status: Home / Self Care | Payer: BC Managed Care – PPO | Attending: Emergency Medicine | Admitting: Emergency Medicine

## 2010-12-07 DIAGNOSIS — J3489 Other specified disorders of nose and nasal sinuses: Secondary | ICD-10-CM | POA: Insufficient documentation

## 2010-12-07 DIAGNOSIS — R51 Headache: Secondary | ICD-10-CM | POA: Insufficient documentation

## 2010-12-07 DIAGNOSIS — D573 Sickle-cell trait: Secondary | ICD-10-CM | POA: Insufficient documentation

## 2011-01-15 LAB — CBC
HCT: 38.1
MCHC: 34.6
MCV: 88.1
Platelets: 268
Platelets: 272
RBC: 4.54
RDW: 13.6
RDW: 13.9
WBC: 13.5 — ABNORMAL HIGH

## 2011-01-15 LAB — PREGNANCY, URINE: Preg Test, Ur: NEGATIVE

## 2011-01-16 ENCOUNTER — Inpatient Hospital Stay (HOSPITAL_COMMUNITY)
Admission: AD | Admit: 2011-01-16 | Discharge: 2011-01-16 | Disposition: A | Discharge status: Home / Self Care | Payer: BC Managed Care – PPO | Source: Ambulatory Visit | Attending: Obstetrics & Gynecology | Admitting: Obstetrics & Gynecology

## 2011-01-16 ENCOUNTER — Encounter (HOSPITAL_COMMUNITY): Payer: Self-pay | Admitting: *Deleted

## 2011-01-16 DIAGNOSIS — R300 Dysuria: Secondary | ICD-10-CM

## 2011-01-16 HISTORY — DX: Interstitial cystitis (chronic) without hematuria: N30.10

## 2011-01-16 HISTORY — DX: Sickle-cell trait: D57.3

## 2011-01-16 HISTORY — DX: Cardiac murmur, unspecified: R01.1

## 2011-01-16 HISTORY — DX: Headache: R51

## 2011-01-16 HISTORY — DX: Vitamin D deficiency, unspecified: E55.9

## 2011-01-16 LAB — URINALYSIS, ROUTINE W REFLEX MICROSCOPIC
Bilirubin Urine: NEGATIVE
Leukocytes, UA: NEGATIVE
Nitrite: NEGATIVE
Specific Gravity, Urine: 1.02 (ref 1.005–1.030)
Urobilinogen, UA: 0.2 mg/dL (ref 0.0–1.0)
pH: 6 (ref 5.0–8.0)

## 2011-01-16 MED ORDER — PHENAZOPYRIDINE HCL 100 MG PO TABS
200.0000 mg | ORAL_TABLET | Freq: Once | ORAL | Status: AC
  Administered 2011-01-16: 200 mg via ORAL
  Filled 2011-01-16: qty 2

## 2011-01-16 MED ORDER — FLUCONAZOLE 150 MG PO TABS: 150.0000 mg | ORAL_TABLET | Freq: Once | ORAL | Status: AC

## 2011-01-16 MED ORDER — CEPHALEXIN 500 MG PO CAPS: 500.0000 mg | ORAL_CAPSULE | Freq: Four times a day (QID) | ORAL | Status: AC

## 2011-01-16 MED ORDER — PROMETHAZINE HCL 25 MG PO TABS
25.0000 mg | ORAL_TABLET | Freq: Once | ORAL | Status: AC
  Administered 2011-01-16: 25 mg via ORAL
  Filled 2011-01-16: qty 1

## 2011-01-16 NOTE — Progress Notes (Signed)
Pt states, " I am going to the bathroom to pee about five times in and hour for the past three days and I'm having pain in my low abdomen and low back. I've had nausea and a headache. I have a small clear discharge with an odor too."

## 2011-01-16 NOTE — ED Provider Notes (Signed)
History   Pt presents today c/o dysuria, urinary frequency, urgency, and hesitancy for the past 1wk. She states "it feels like all my other UTIs." She has a hx of interstitial cystitis and frequent UTIs. She denies fever, vag dc, bleeding, or any other sx.   Chief Complaint  Patient presents with  . Back Pain  . Leg Swelling  . Nausea   HPI  OB History    Grav Para Term Preterm Abortions TAB SAB Ect Mult Living   1 1        1       Past Medical History  Diagnosis Date  . Sickle cell trait   . Interstitial cystitis   . Vitamin D insufficiency   . Headache   . Endometriosis   . Heart murmur     Past Surgical History  Procedure Date  . Abdominal hysterectomy   . Eye surgery   . Ankle surgery   . Cystectomy   . Bartholin gland cyst excision   . Inguinal hernia repair   . Dilation and curettage of uterus   . Laparoscopy     No family history on file.  History  Substance Use Topics  . Smoking status: Current Some Day Smoker -- 0.2 packs/day  . Smokeless tobacco: Not on file  . Alcohol Use: No    Allergies:  Allergies  Allergen Reactions  . Aspirin   . Augmentin   . Erythromycin   . Oxycodone   . Sulfa Antibiotics     Prescriptions prior to admission  Medication Sig Dispense Refill  . fluticasone (FLONASE) 50 MCG/ACT nasal spray Place 2 sprays into the nose 2 (two) times a week.        Marland Kitchen olopatadine (PATANOL) 0.1 % ophthalmic solution 1 drop 2 (two) times a week.          Review of Systems  Constitutional: Negative for fever and chills.  Gastrointestinal: Positive for nausea. Negative for vomiting, abdominal pain, diarrhea and constipation.  Genitourinary: Positive for dysuria, urgency and frequency. Negative for hematuria and flank pain.  Neurological: Negative for dizziness.  Psychiatric/Behavioral: Negative for depression and suicidal ideas.   Physical Exam   Blood pressure 130/91, pulse 81, temperature 98.9 F (37.2 C), temperature source Oral,  resp. rate 18, height 5' 5.75" (1.67 m), weight 175 lb (79.379 kg).  Physical Exam  Constitutional: She is oriented to person, place, and time. She appears well-developed and well-nourished. No distress.  HENT:  Head: Normocephalic and atraumatic.  Eyes: EOM are normal. Pupils are equal, round, and reactive to light.  GI: Soft. She exhibits no distension and no mass. There is no tenderness. There is no rebound and no guarding.  Genitourinary: Vagina normal. No bleeding around the vagina. No vaginal discharge found.       Uterus is absent. No adnexal masses. Pt is tender over bladder trigone.  Neurological: She is alert and oriented to person, place, and time.  Skin: Skin is warm and dry. She is not diaphoretic.  Psychiatric: She has a normal mood and affect. Her behavior is normal. Judgment and thought content normal.    MAU Course  Procedures  Results for orders placed during the hospital encounter of 01/16/11 (from the past 24 hour(s))  URINALYSIS, ROUTINE W REFLEX MICROSCOPIC     Status: Normal   Collection Time   01/16/11  8:00 PM      Component Value Range   Color, Urine YELLOW  YELLOW    Appearance CLEAR  CLEAR    Specific Gravity, Urine 1.020  1.005 - 1.030    pH 6.0  5.0 - 8.0    Glucose, UA NEGATIVE  NEGATIVE (mg/dL)   Hgb urine dipstick NEGATIVE  NEGATIVE    Bilirubin Urine NEGATIVE  NEGATIVE    Ketones, ur NEGATIVE  NEGATIVE (mg/dL)   Protein, ur NEGATIVE  NEGATIVE (mg/dL)   Urobilinogen, UA 0.2  0.0 - 1.0 (mg/dL)   Nitrite NEGATIVE  NEGATIVE    Leukocytes, UA NEGATIVE  NEGATIVE      Assessment and Plan  Dysuria: discussed with pt at length. Will tx prophylactically with keflex. Will send urine for culture. Discussed diet, activity, risks, and precautions.  Clinton Gallant. Redmond Whittley III, DrHSc, MPAS, PA-C  01/16/2011, 8:39 PM   Henrietta Hoover, PA 01/16/11 2043

## 2011-01-18 LAB — URINE CULTURE

## 2011-01-18 NOTE — ED Provider Notes (Signed)
Agree with above note.  Dawn Atkinson H. 01/18/2011 3:06 PM

## 2011-01-26 LAB — URINALYSIS, ROUTINE W REFLEX MICROSCOPIC
Bilirubin Urine: NEGATIVE
Glucose, UA: NEGATIVE
Nitrite: NEGATIVE
Specific Gravity, Urine: 1.019
pH: 5.5

## 2011-01-26 LAB — DIFFERENTIAL
Eosinophils Absolute: 0.3
Eosinophils Relative: 4
Lymphocytes Relative: 38
Lymphs Abs: 3.4
Monocytes Absolute: 0.7

## 2011-01-26 LAB — COMPREHENSIVE METABOLIC PANEL
ALT: 10
AST: 17
Albumin: 3.9
CO2: 25
Calcium: 9.5
Chloride: 105
Creatinine, Ser: 0.75
GFR calc Af Amer: >60
GFR calc non Af Amer: >60
Sodium: 136

## 2011-01-26 LAB — CBC
Platelets: 262
RBC: 4.47
WBC: 9.1

## 2011-01-26 LAB — URINE MICROSCOPIC-ADD ON

## 2011-01-26 LAB — LIPASE, BLOOD: Lipase: 31

## 2011-01-26 LAB — URINE CULTURE

## 2011-01-29 LAB — URINALYSIS, ROUTINE W REFLEX MICROSCOPIC
Glucose, UA: NEGATIVE
Hgb urine dipstick: NEGATIVE
Specific Gravity, Urine: 1.02
pH: 6

## 2011-01-29 LAB — GC/CHLAMYDIA PROBE AMP, GENITAL: GC Probe Amp, Genital: NEGATIVE

## 2011-01-29 LAB — WET PREP, GENITAL
Clue Cells Wet Prep HPF POC: NONE SEEN
Trich, Wet Prep: NONE SEEN
Yeast Wet Prep HPF POC: NONE SEEN

## 2011-02-05 ENCOUNTER — Other Ambulatory Visit: Payer: Self-pay | Admitting: Family Medicine

## 2011-02-05 ENCOUNTER — Ambulatory Visit
Admission: RE | Admit: 2011-02-05 | Discharge: 2011-02-05 | Disposition: A | Discharge status: Home / Self Care | Payer: BC Managed Care – PPO | Source: Ambulatory Visit | Attending: Family Medicine | Admitting: Family Medicine

## 2011-02-05 DIAGNOSIS — M542 Cervicalgia: Secondary | ICD-10-CM

## 2011-02-05 DIAGNOSIS — M549 Dorsalgia, unspecified: Secondary | ICD-10-CM

## 2011-05-28 ENCOUNTER — Emergency Department (HOSPITAL_COMMUNITY)
Admission: EM | Admit: 2011-05-28 | Discharge: 2011-05-29 | Disposition: A | Discharge status: Home / Self Care | Payer: BC Managed Care – PPO | Attending: Emergency Medicine | Admitting: Emergency Medicine

## 2011-05-28 DIAGNOSIS — Z79899 Other long term (current) drug therapy: Secondary | ICD-10-CM | POA: Insufficient documentation

## 2011-05-28 DIAGNOSIS — R51 Headache: Secondary | ICD-10-CM | POA: Insufficient documentation

## 2011-05-28 DIAGNOSIS — R0789 Other chest pain: Secondary | ICD-10-CM

## 2011-05-28 DIAGNOSIS — J45909 Unspecified asthma, uncomplicated: Secondary | ICD-10-CM | POA: Insufficient documentation

## 2011-05-28 DIAGNOSIS — J3489 Other specified disorders of nose and nasal sinuses: Secondary | ICD-10-CM | POA: Insufficient documentation

## 2011-05-29 ENCOUNTER — Encounter (HOSPITAL_COMMUNITY): Payer: Self-pay | Admitting: Emergency Medicine

## 2011-05-29 ENCOUNTER — Other Ambulatory Visit: Payer: Self-pay

## 2011-05-29 LAB — DIFFERENTIAL
Basophils Relative: 1 % (ref 0–1)
Eosinophils Absolute: 0.4 10*3/uL (ref 0.0–0.7)
Eosinophils Relative: 4 % (ref 0–5)
Lymphs Abs: 4 10*3/uL (ref 0.7–4.0)
Neutrophils Relative %: 46 % (ref 43–77)

## 2011-05-29 LAB — CBC
MCH: 29.3 pg (ref 26.0–34.0)
MCHC: 35.3 g/dL (ref 30.0–36.0)
MCV: 82.9 fL (ref 78.0–100.0)
Platelets: 290 10*3/uL (ref 150–400)
RBC: 4.68 MIL/uL (ref 3.87–5.11)
RDW: 14 % (ref 11.5–15.5)

## 2011-05-29 LAB — URINALYSIS, ROUTINE W REFLEX MICROSCOPIC
Glucose, UA: NEGATIVE mg/dL
Hgb urine dipstick: NEGATIVE
Ketones, ur: NEGATIVE mg/dL
Leukocytes, UA: NEGATIVE
pH: 6 (ref 5.0–8.0)

## 2011-05-29 LAB — CARDIAC PANEL(CRET KIN+CKTOT+MB+TROPI): Total CK: 108 U/L (ref 7–177)

## 2011-05-29 LAB — POCT I-STAT, CHEM 8
BUN: 9 mg/dL (ref 6–23)
Calcium, Ion: 1.13 mmol/L (ref 1.12–1.32)
Chloride: 107 mEq/L (ref 96–112)
Creatinine, Ser: 0.6 mg/dL (ref 0.50–1.10)
Sodium: 143 mEq/L (ref 135–145)

## 2011-05-29 MED ORDER — ACETAMINOPHEN 325 MG PO TABS
650.0000 mg | ORAL_TABLET | Freq: Once | ORAL | Status: AC
  Administered 2011-05-29: 650 mg via ORAL
  Filled 2011-05-29: qty 2

## 2011-05-29 NOTE — ED Provider Notes (Signed)
History     CSN: [id number]  Arrival date & time 05/28/11  2342   First MD Initiated Contact with Patient 05/29/11 0032      Chief Complaint  Patient presents with  . Headache  . Chest Pain    (Consider location/radiation/quality/duration/timing/severity/associated sxs/prior treatment) HPI Comments: Patient developed headache yesterday occur intermittently throughout the day.  She took no over-the-counter products as she says nothing works.  Today.  Headache returned about 5 PM again she took nothing for pain relief and noticed that her chest was hurting her in the central substernal area radiating to her left axilla, which is worse with deep breath or movement of her arm.  She states her blood pressure is elevated, although she takes no medication for blood pressure control.  She states her doctor told her to come to the emergency room whenever she had symptoms like this  Patient is a 41 y.o. female presenting with headaches and chest pain. The history is provided by the patient.  Headache  This is a new problem. The current episode started yesterday. The problem occurs constantly. The problem has not changed since onset.The headache is associated with activity. The pain is located in the bilateral region. The pain is moderate. The pain does not radiate. Associated symptoms include chest pressure. Pertinent negatives include no fever, no near-syncope, no palpitations, no shortness of breath and no nausea. She has tried nothing for the symptoms.  Chest Pain Pertinent negatives for primary symptoms include no fever, no shortness of breath, no palpitations, no nausea and no dizziness.  Pertinent negatives for associated symptoms include no near-syncope, no numbness and no weakness.     Past Medical History  Diagnosis Date  . Sickle cell trait   . Interstitial cystitis   . Vitamin D insufficiency   . Headache   . Endometriosis   . Heart murmur   . Asthma     Past Surgical History    Procedure Date  . Abdominal hysterectomy   . Eye surgery   . Ankle surgery   . Cystectomy   . Bartholin gland cyst excision   . Inguinal hernia repair   . Dilation and curettage of uterus   . Laparoscopy     History reviewed. No pertinent family history.  History  Substance Use Topics  . Smoking status: Current Some Day Smoker -- 0.2 packs/day  . Smokeless tobacco: Not on file  . Alcohol Use: No    OB History    Grav Para Term Preterm Abortions TAB SAB Ect Mult Living   1 1        1       Review of Systems  Constitutional: Negative for fever and chills.  HENT: Positive for rhinorrhea and sinus pressure.   Respiratory: Negative for shortness of breath.   Cardiovascular: Positive for chest pain. Negative for palpitations and near-syncope.  Gastrointestinal: Negative for nausea.  Genitourinary: Negative for dysuria and frequency.  Musculoskeletal: Negative for back pain and arthralgias.  Neurological: Positive for headaches. Negative for dizziness, weakness and numbness.    Allergies  Avelox; Aspirin; Augmentin; Erythromycin; Gabapentin; Oxycodone; and Sulfa antibiotics  Home Medications   Current Outpatient Rx  Name Route Sig Dispense Refill  . ALBUTEROL SULFATE HFA 108 (90 BASE) MCG/ACT IN AERS Inhalation Inhale 2 puffs into the lungs every 6 (six) hours as needed. Allergies    . AZELASTINE HCL 137 MCG/SPRAY NA SOLN Nasal Place 1 spray into the nose 2 (two) times daily. Use in each  nostril as directed    . CETIRIZINE HCL 10 MG PO TABS Oral Take 10 mg by mouth daily.      Marland Kitchen VITAMIN B-12 IJ Injection Inject 1,000 Units/mL as directed every 14 (fourteen) days.    . IBUPROFEN 200 MG PO TABS Oral Take 400 mg by mouth every 6 (six) hours as needed. For pain.    . MOMETASONE FUROATE 220 MCG/INH IN AEPB Inhalation Inhale 2 puffs into the lungs daily.    Marland Kitchen NAPROXEN SODIUM 220 MG PO TABS Oral Take 220 mg by mouth 2 (two) times daily with a meal. For pain.     Marland Kitchen TRAMADOL HCL  50 MG PO TABS Oral Take 50 mg by mouth every 6 (six) hours as needed. Pain    . VITAMIN D (ERGOCALCIFEROL) 50000 UNITS PO CAPS Oral Take 50,000 Units by mouth every 30 (thirty) days.      BP 121/76  Pulse 66  Temp 98.8 F (37.1 C)  Resp 18  SpO2 100%  Physical Exam  Constitutional: She is oriented to person, place, and time. She appears well-developed and well-nourished.  HENT:  Head: Normocephalic.  Eyes: Pupils are equal, round, and reactive to light.  Neck: Normal range of motion.  Cardiovascular: Normal rate, regular rhythm and normal heart sounds.   Pulmonary/Chest: Effort normal. No respiratory distress. She has no wheezes.  Abdominal: Soft.  Musculoskeletal: Normal range of motion.  Neurological: She is alert and oriented to person, place, and time.  Skin: Skin is warm and dry.    ED Course  Procedures (including critical care time)  Labs Reviewed  POCT I-STAT, CHEM 8 - Abnormal; Notable for the following:    Glucose, Bld 101 (*)    All other components within normal limits  CBC  DIFFERENTIAL  CARDIAC PANEL(CRET KIN+CKTOT+MB+TROPI)  URINALYSIS, ROUTINE W REFLEX MICROSCOPIC   No results found.   1. Headache   2. Chest pain, musculoskeletal     ED ECG REPORT   Date: 05/29/2011  EKG Time: 3:44 AM  Rate:71  Rhythm: normal sinus rhythm,  normal EKG, normal sinus rhythm, unchanged from previous tracings  Axis: normal  Intervals:early repol  ST&T Change:none  Narrative Interpretation: normal EKG            MDM  We'll check cardiac labs, EKG, and chest x-ray treat headache pain, hydrate        Arman Filter, NP 05/29/11 0147  Arman Filter, NP 05/29/11 0344  Arman Filter, NP 05/29/11 [phone]  Medical screening examination/treatment/procedure(s) were performed by non-physician practitioner and as supervising physician I was immediately available for consultation/collaboration.   Sunnie Nielsen, MD 05/29/11 [phone]

## 2012-03-21 ENCOUNTER — Emergency Department (HOSPITAL_COMMUNITY)
Admission: EM | Admit: 2012-03-21 | Discharge: 2012-03-21 | Disposition: A | Discharge status: Home / Self Care | Payer: BC Managed Care – PPO | Attending: Emergency Medicine | Admitting: Emergency Medicine

## 2012-03-21 ENCOUNTER — Encounter (HOSPITAL_COMMUNITY): Payer: Self-pay

## 2012-03-21 DIAGNOSIS — K5900 Constipation, unspecified: Secondary | ICD-10-CM

## 2012-03-21 DIAGNOSIS — K6289 Other specified diseases of anus and rectum: Secondary | ICD-10-CM | POA: Insufficient documentation

## 2012-03-21 DIAGNOSIS — D573 Sickle-cell trait: Secondary | ICD-10-CM | POA: Insufficient documentation

## 2012-03-21 DIAGNOSIS — Z8719 Personal history of other diseases of the digestive system: Secondary | ICD-10-CM | POA: Insufficient documentation

## 2012-03-21 DIAGNOSIS — J45909 Unspecified asthma, uncomplicated: Secondary | ICD-10-CM | POA: Insufficient documentation

## 2012-03-21 DIAGNOSIS — Z8679 Personal history of other diseases of the circulatory system: Secondary | ICD-10-CM | POA: Insufficient documentation

## 2012-03-21 DIAGNOSIS — F17200 Nicotine dependence, unspecified, uncomplicated: Secondary | ICD-10-CM | POA: Insufficient documentation

## 2012-03-21 DIAGNOSIS — R112 Nausea with vomiting, unspecified: Secondary | ICD-10-CM | POA: Insufficient documentation

## 2012-03-21 DIAGNOSIS — R109 Unspecified abdominal pain: Secondary | ICD-10-CM | POA: Insufficient documentation

## 2012-03-21 MED ORDER — POLYETHYLENE GLYCOL 3350 17 G PO PACK: 17.0000 g | PACK | Freq: Every day | ORAL | Status: DC

## 2012-03-21 MED ORDER — ONDANSETRON 8 MG PO TBDP
8.0000 mg | ORAL_TABLET | Freq: Once | ORAL | Status: AC
  Administered 2012-03-21: 8 mg via ORAL
  Filled 2012-03-21: qty 1

## 2012-03-21 NOTE — ED Notes (Signed)
Pt states last BM was on Thursday. Pt takes stool softener every day. Pt has also taken mag citrate and has vomited since. Pt c/o tightness to stomach and pain down legs. Pt states when she tries to push to have a BM, she feels like she is about to pass out. Pt states she is passing gas, and only "pooping small green pellets."

## 2012-03-21 NOTE — ED Notes (Signed)
MD at bedside. 

## 2012-03-21 NOTE — ED Provider Notes (Signed)
History     CSN: [id number]  Arrival date & time 03/21/12  1631   First MD Initiated Contact with Patient 03/21/12 2116      Chief Complaint  Patient presents with  . Constipation     Patient is a 41 y.o. female presenting with constipation. The history is provided by the patient.  Constipation  The current episode started 3 to 5 days ago. The onset was gradual. The problem occurs continuously. The problem has been gradually worsening. The pain is moderate. The stool is described as hard. There was no prior successful therapy. Associated symptoms include abdominal pain, nausea, rectal pain and vomiting. Pertinent negatives include no anorexia, no fever, no diarrhea, no hematemesis, no hemorrhoids, no hematuria, no vaginal bleeding, no chest pain and no difficulty breathing.  PT REPORTS NO BM FOR PAST 4 DAYS SHE REPORTS SHE HAS VOMITED DUE TO CONSTIPATION SHE REPORTS ABDOMINAL PAIN AND RECTAL PAIN NO RECTAL TRAUMA NO FEVER NO DYSURIA SHE IS PASSING FLATUS SHE REPORTS PASSING SMALL AMT OF STOOL SHE HAS TRIED MAG CITRATE WITHOUT EFFECT SHE REPORTS SHE "STRAINED SO HARD" SHE FELT WEAK AND BRIEFLY PASSED OUT WHILE ON TOILET (NO FALLS, NO HEAD INJURY) NO CP/SOB REPORTED   Past Medical History  Diagnosis Date  . Sickle cell trait   . Interstitial cystitis   . Vitamin D insufficiency   . Headache   . Endometriosis   . Heart murmur   . Asthma     Past Surgical History  Procedure Date  . Abdominal hysterectomy   . Eye surgery   . Ankle surgery   . Cystectomy   . Bartholin gland cyst excision   . Inguinal hernia repair   . Dilation and curettage of uterus   . Laparoscopy     History reviewed. No pertinent family history.  History  Substance Use Topics  . Smoking status: Current Some Day Smoker -- 0.2 packs/day  . Smokeless tobacco: Not on file  . Alcohol Use: No    OB History    Grav Para Term Preterm Abortions TAB SAB Ect Mult Living   1 1        1        Review of Systems  Constitutional: Negative for fever.  Respiratory: Negative for shortness of breath.   Cardiovascular: Negative for chest pain.  Gastrointestinal: Positive for nausea, vomiting, abdominal pain, constipation and rectal pain. Negative for diarrhea, blood in stool, anorexia, hematemesis and hemorrhoids.  Genitourinary: Negative for dysuria, hematuria and vaginal bleeding.  Neurological: Negative for weakness.    Allergies  Avelox; Amoxicillin-pot clavulanate; Aspirin; Erythromycin; Gabapentin; Oxycodone; and Sulfa antibiotics  Home Medications   Current Outpatient Rx  Name  Route  Sig  Dispense  Refill  . CETIRIZINE HCL 10 MG PO TABS   Oral   Take 10 mg by mouth daily as needed. For allergies         . MOMETASONE FUROATE 220 MCG/INH IN AEPB   Inhalation   Inhale 2 puffs into the lungs daily.         Marland Kitchen NAPROXEN SODIUM 220 MG PO TABS   Oral   Take 220 mg by mouth 2 (two) times daily with a meal. For pain.         Marland Kitchen NEBIVOLOL HCL 5 MG PO TABS   Oral   Take 5 mg by mouth daily.         Marland Kitchen TIZANIDINE HCL 4 MG PO TABS   Oral   Take  4 mg by mouth every 6 (six) hours as needed. For spasms.         . ALBUTEROL SULFATE HFA 108 (90 BASE) MCG/ACT IN AERS   Inhalation   Inhale 2 puffs into the lungs every 6 (six) hours as needed. Allergies         . POLYETHYLENE GLYCOL 3350 PO PACK   Oral   Take 17 g by mouth daily.   14 each   0     BP 133/79  Pulse 67  Resp 17  SpO2 98%  Physical Exam CONSTITUTIONAL: Well developed/well nourished HEAD AND FACE: Normocephalic/atraumatic EYES: EOMI/PERRL ENMT: Mucous membranes moist NECK: supple no meningeal signs SPINE:entire spine nontender CV: S1/S2 noted, no murmurs/rubs/gallops noted LUNGS: Lungs are clear to auscultation bilaterally, no apparent distress ABDOMEN: soft, nontender, no rebound or guarding, +BS in all quadrants GU:no cva tenderness Rectal - no fecal impaction noted.  No rectal  tenderness/abscess/mass noted.  Chaperone present NEURO: Pt is awake/alert, moves all extremitiesx4 EXTREMITIES: pulses normal, full ROM SKIN: warm, color normal PSYCH: no abnormalities of mood noted  ED Course  Procedures  1. Constipation    Pt without vomiting here.  Her abdomen is soft and no tenderness noted.  Suspect simple constipation.  Doubt acute abdomen/bowel obstruction at this time  Will try miralax at home.  Discussed strict return precautions including worsened pain/vomiting while taking this medication   MDM  Nursing notes including past medical history and social history reviewed and considered in documentation         Joya Gaskins, MD 03/21/12 2254

## 2012-04-07 ENCOUNTER — Emergency Department (HOSPITAL_COMMUNITY)
Admission: EM | Admit: 2012-04-07 | Discharge: 2012-04-07 | Disposition: A | Discharge status: Home / Self Care | Payer: BC Managed Care – PPO | Attending: Emergency Medicine | Admitting: Emergency Medicine

## 2012-04-07 ENCOUNTER — Encounter (HOSPITAL_COMMUNITY): Payer: Self-pay | Admitting: *Deleted

## 2012-04-07 DIAGNOSIS — R109 Unspecified abdominal pain: Secondary | ICD-10-CM | POA: Insufficient documentation

## 2012-04-07 DIAGNOSIS — Z8639 Personal history of other endocrine, nutritional and metabolic disease: Secondary | ICD-10-CM | POA: Insufficient documentation

## 2012-04-07 DIAGNOSIS — N3010 Interstitial cystitis (chronic) without hematuria: Secondary | ICD-10-CM | POA: Insufficient documentation

## 2012-04-07 DIAGNOSIS — R011 Cardiac murmur, unspecified: Secondary | ICD-10-CM | POA: Insufficient documentation

## 2012-04-07 DIAGNOSIS — F17200 Nicotine dependence, unspecified, uncomplicated: Secondary | ICD-10-CM | POA: Insufficient documentation

## 2012-04-07 DIAGNOSIS — Z8742 Personal history of other diseases of the female genital tract: Secondary | ICD-10-CM | POA: Insufficient documentation

## 2012-04-07 DIAGNOSIS — R112 Nausea with vomiting, unspecified: Secondary | ICD-10-CM | POA: Insufficient documentation

## 2012-04-07 DIAGNOSIS — J45909 Unspecified asthma, uncomplicated: Secondary | ICD-10-CM | POA: Insufficient documentation

## 2012-04-07 DIAGNOSIS — Z79899 Other long term (current) drug therapy: Secondary | ICD-10-CM | POA: Insufficient documentation

## 2012-04-07 DIAGNOSIS — D573 Sickle-cell trait: Secondary | ICD-10-CM | POA: Insufficient documentation

## 2012-04-07 LAB — CBC WITH DIFFERENTIAL/PLATELET
Basophils Absolute: 0 10*3/uL (ref 0.0–0.1)
Basophils Relative: 0 % (ref 0–1)
Eosinophils Absolute: 0.1 10*3/uL (ref 0.0–0.7)
Eosinophils Relative: 1 % (ref 0–5)
MCH: 29.4 pg (ref 26.0–34.0)
MCV: 82.5 fL (ref 78.0–100.0)
Neutrophils Relative %: 86 % — ABNORMAL HIGH (ref 43–77)
Platelets: 268 10*3/uL (ref 150–400)
RDW: 13.8 % (ref 11.5–15.5)

## 2012-04-07 LAB — URINALYSIS, ROUTINE W REFLEX MICROSCOPIC
Bilirubin Urine: NEGATIVE
Glucose, UA: NEGATIVE mg/dL
Hgb urine dipstick: NEGATIVE
Ketones, ur: NEGATIVE mg/dL
Nitrite: NEGATIVE
Protein, ur: NEGATIVE mg/dL
Specific Gravity, Urine: 1.019 (ref 1.005–1.030)
Urobilinogen, UA: 1 mg/dL (ref 0.0–1.0)
pH: 8.5 — ABNORMAL HIGH (ref 5.0–8.0)

## 2012-04-07 LAB — URINE MICROSCOPIC-ADD ON

## 2012-04-07 LAB — COMPREHENSIVE METABOLIC PANEL
ALT: 12 U/L (ref 0–35)
AST: 22 U/L (ref 0–37)
Albumin: 3.8 g/dL (ref 3.5–5.2)
Calcium: 9.2 mg/dL (ref 8.4–10.5)
GFR calc Af Amer: >90 mL/min (ref >90)
Sodium: 136 mEq/L (ref 135–145)
Total Protein: 6.8 g/dL (ref 6.0–8.3)

## 2012-04-07 MED ORDER — ONDANSETRON 8 MG PO TBDP
ORAL_TABLET | ORAL | Status: AC
  Administered 2012-04-07: 8 mg
  Filled 2012-04-07: qty 1

## 2012-04-07 MED ORDER — ONDANSETRON HCL 4 MG/2ML IJ SOLN
4.0000 mg | Freq: Once | INTRAMUSCULAR | Status: AC
  Administered 2012-04-07: 4 mg via INTRAVENOUS
  Filled 2012-04-07: qty 2

## 2012-04-07 MED ORDER — PROMETHAZINE HCL 25 MG RE SUPP: 25.0000 mg | Freq: Four times a day (QID) | RECTAL | Status: DC | PRN

## 2012-04-07 MED ORDER — SODIUM CHLORIDE 0.9 % IV BOLUS (SEPSIS)
1000.0000 mL | Freq: Once | INTRAVENOUS | Status: AC
  Administered 2012-04-07: 1000 mL via INTRAVENOUS

## 2012-04-07 MED ORDER — MORPHINE SULFATE 4 MG/ML IJ SOLN
4.0000 mg | Freq: Once | INTRAMUSCULAR | Status: AC
  Administered 2012-04-07: 4 mg via INTRAVENOUS
  Filled 2012-04-07: qty 1

## 2012-04-07 NOTE — ED Provider Notes (Signed)
Medical screening examination/treatment/procedure(s) were performed by non-physician practitioner and as supervising physician I was immediately available for consultation/collaboration.  Flint Melter, MD 04/07/12 1534

## 2012-04-07 NOTE — ED Provider Notes (Signed)
History     CSN: [id number]  Arrival date & time 04/07/12  4098   First MD Initiated Contact with Patient 04/07/12 0809      Chief Complaint  Patient presents with  . Nausea  . Emesis  . Abdominal Cramping    (Consider location/radiation/quality/duration/timing/severity/associated sxs/prior treatment) Patient is a 41 y.o. female presenting with vomiting and cramps. The history is provided by the patient.  Emesis  This is a new problem. The current episode started yesterday. The problem occurs more than 10 times per day. The problem has not changed since onset.There has been no fever. Associated symptoms include abdominal pain. Pertinent negatives include no chills, no diarrhea and no fever. Associated symptoms comments: She started having nausea and vomiting last night shortly after tuna. No known fever. She started having abdominal cramping after onset of vomiting. No diarrhea. No hematemesis..  Abdominal Cramping The primary symptoms of the illness include abdominal pain and vomiting. The primary symptoms of the illness do not include fever or diarrhea.  Symptoms associated with the illness do not include chills.    Past Medical History  Diagnosis Date  . Sickle cell trait   . Interstitial cystitis   . Vitamin D insufficiency   . Headache   . Endometriosis   . Heart murmur   . Asthma     Past Surgical History  Procedure Date  . Abdominal hysterectomy   . Eye surgery   . Ankle surgery   . Cystectomy   . Bartholin gland cyst excision   . Inguinal hernia repair   . Dilation and curettage of uterus   . Laparoscopy     No family history on file.  History  Substance Use Topics  . Smoking status: Current Some Day Smoker -- 0.2 packs/day  . Smokeless tobacco: Not on file  . Alcohol Use: No    OB History    Grav Para Term Preterm Abortions TAB SAB Ect Mult Living   1 1        1       Review of Systems  Constitutional: Negative for fever and chills.  HENT:  Negative.   Respiratory: Negative.   Cardiovascular: Negative.   Gastrointestinal: Positive for vomiting and abdominal pain. Negative for diarrhea.  Genitourinary: Negative.   Musculoskeletal: Negative.   Skin: Negative.   Neurological: Negative.     Allergies  Avelox; Amoxicillin-pot clavulanate; Aspirin; Erythromycin; Gabapentin; Oxycodone; and Sulfa antibiotics  Home Medications   Current Outpatient Rx  Name  Route  Sig  Dispense  Refill  . ALBUTEROL SULFATE HFA 108 (90 BASE) MCG/ACT IN AERS   Inhalation   Inhale 2 puffs into the lungs every 6 (six) hours as needed. Allergies         . CETIRIZINE HCL 10 MG PO TABS   Oral   Take 10 mg by mouth daily as needed. For allergies         . MOMETASONE FUROATE 220 MCG/INH IN AEPB   Inhalation   Inhale 2 puffs into the lungs daily.         Marland Kitchen NAPROXEN SODIUM 220 MG PO TABS   Oral   Take 220 mg by mouth 2 (two) times daily with a meal. For pain.         Marland Kitchen NEBIVOLOL HCL 5 MG PO TABS   Oral   Take 5 mg by mouth daily.         Marland Kitchen POLYETHYLENE GLYCOL 3350 PO PACK   Oral  Take 17 g by mouth daily.   14 each   0   . TIZANIDINE HCL 4 MG PO TABS   Oral   Take 4 mg by mouth every 6 (six) hours as needed. For spasms.           BP 117/55  Pulse 78  Temp 98.6 F (37 C) (Oral)  Resp 22  SpO2 98%  Physical Exam  Constitutional: She appears well-developed and well-nourished.  HENT:  Head: Normocephalic.  Neck: Normal range of motion. Neck supple.  Cardiovascular: Normal rate and regular rhythm.   No murmur heard. Pulmonary/Chest: Effort normal and breath sounds normal. She has no wheezes. She has no rales.  Abdominal: Soft. Bowel sounds are normal. There is no tenderness. There is no rebound and no guarding.  Musculoskeletal: Normal range of motion.  Neurological: She is alert. No cranial nerve deficit.  Skin: Skin is warm and dry. No rash noted.  Psychiatric: She has a normal mood and affect.    ED Course   Procedures (including critical care time)   Labs Reviewed  CBC WITH DIFFERENTIAL  COMPREHENSIVE METABOLIC PANEL  LIPASE, BLOOD  URINALYSIS, ROUTINE W REFLEX MICROSCOPIC   Results for orders placed during the hospital encounter of 04/07/12  CBC WITH DIFFERENTIAL      Component Value Range   WBC 9.9  4.0 - 10.5 K/uL   RBC 4.80  3.87 - 5.11 MIL/uL   Hemoglobin 14.1  12.0 - 15.0 g/dL   HCT 16.1  09.6 - 04.5 %   MCV 82.5  78.0 - 100.0 fL   MCH 29.4  26.0 - 34.0 pg   MCHC 35.6  30.0 - 36.0 g/dL   RDW 40.9  81.1 - 91.4 %   Platelets 268  150 - 400 K/uL   Neutrophils Relative 86 (*) 43 - 77 %   Neutro Abs 8.5 (*) 1.7 - 7.7 K/uL   Lymphocytes Relative 7 (*) 12 - 46 %   Lymphs Abs 0.7  0.7 - 4.0 K/uL   Monocytes Relative 6  3 - 12 %   Monocytes Absolute 0.6  0.1 - 1.0 K/uL   Eosinophils Relative 1  0 - 5 %   Eosinophils Absolute 0.1  0.0 - 0.7 K/uL   Basophils Relative 0  0 - 1 %   Basophils Absolute 0.0  0.0 - 0.1 K/uL  COMPREHENSIVE METABOLIC PANEL      Component Value Range   Sodium 136  135 - 145 mEq/L   Potassium 3.6  3.5 - 5.1 mEq/L   Chloride 103  96 - 112 mEq/L   CO2 24  19 - 32 mEq/L   Glucose, Bld 113 (*) 70 - 99 mg/dL   BUN 10  6 - 23 mg/dL   Creatinine, Ser 7.82  0.50 - 1.10 mg/dL   Calcium 9.2  8.4 - 95.6 mg/dL   Total Protein 6.8  6.0 - 8.3 g/dL   Albumin 3.8  3.5 - 5.2 g/dL   AST 22  0 - 37 U/L   ALT 12  0 - 35 U/L   Alkaline Phosphatase 73  39 - 117 U/L   Total Bilirubin 0.6  0.3 - 1.2 mg/dL   GFR calc non Af Amer >90  >90 mL/min   GFR calc Af Amer >90  >90 mL/min  LIPASE, BLOOD      Component Value Range   Lipase 22  11 - 59 U/L  URINALYSIS, ROUTINE W REFLEX MICROSCOPIC  Component Value Range   Color, Urine YELLOW  YELLOW   APPearance CLOUDY (*) CLEAR   Specific Gravity, Urine 1.019  1.005 - 1.030   pH 8.5 (*) 5.0 - 8.0   Glucose, UA NEGATIVE  NEGATIVE mg/dL   Hgb urine dipstick NEGATIVE  NEGATIVE   Bilirubin Urine NEGATIVE  NEGATIVE    Ketones, ur NEGATIVE  NEGATIVE mg/dL   Protein, ur NEGATIVE  NEGATIVE mg/dL   Urobilinogen, UA 1.0  0.0 - 1.0 mg/dL   Nitrite NEGATIVE  NEGATIVE   Leukocytes, UA TRACE (*) NEGATIVE  URINE MICROSCOPIC-ADD ON      Component Value Range   Squamous Epithelial / LPF MANY (*) RARE   WBC, UA 0-2  <3 WBC/hpf   Bacteria, UA RARE  RARE    No results found.   No diagnosis found.  1. Nausea and vomiting  MDM  She feels much better after IV fluids and medication for pain an dnausea. Tolerating PO fluids. Abdomen is non-tender on re-examination. VSS. Stable for discharge.        Rodena Medin, PA-C 04/07/12 1100

## 2012-04-07 NOTE — ED Notes (Signed)
Pt. Unable to give a urine specimen at this time.  notifed the nurse.

## 2012-04-07 NOTE — ED Notes (Signed)
Pt reports n/v since last night right after eating tuna for dinner around 2030.  Pt reports vomiting this am.  Pt also reports abd cramping and bila flank pain.  States "feels like someone is squeezing the shit out of them."  Pt reports she is unable to eat or drink d/t nausea.

## 2012-04-07 NOTE — ED Notes (Signed)
Pt vomited after administering zofran ODT 8mg .

## 2012-07-13 ENCOUNTER — Encounter (HOSPITAL_COMMUNITY): Payer: Self-pay | Admitting: Emergency Medicine

## 2012-07-13 ENCOUNTER — Emergency Department (HOSPITAL_COMMUNITY)
Admission: EM | Admit: 2012-07-13 | Discharge: 2012-07-13 | Disposition: A | Discharge status: Home / Self Care | Payer: Self-pay | Attending: Emergency Medicine | Admitting: Emergency Medicine

## 2012-07-13 ENCOUNTER — Emergency Department (HOSPITAL_COMMUNITY): Payer: Self-pay

## 2012-07-13 DIAGNOSIS — Z87448 Personal history of other diseases of urinary system: Secondary | ICD-10-CM | POA: Insufficient documentation

## 2012-07-13 DIAGNOSIS — J45909 Unspecified asthma, uncomplicated: Secondary | ICD-10-CM | POA: Insufficient documentation

## 2012-07-13 DIAGNOSIS — Z8639 Personal history of other endocrine, nutritional and metabolic disease: Secondary | ICD-10-CM | POA: Insufficient documentation

## 2012-07-13 DIAGNOSIS — R51 Headache: Secondary | ICD-10-CM | POA: Insufficient documentation

## 2012-07-13 DIAGNOSIS — Z862 Personal history of diseases of the blood and blood-forming organs and certain disorders involving the immune mechanism: Secondary | ICD-10-CM | POA: Insufficient documentation

## 2012-07-13 DIAGNOSIS — Z8742 Personal history of other diseases of the female genital tract: Secondary | ICD-10-CM | POA: Insufficient documentation

## 2012-07-13 DIAGNOSIS — R220 Localized swelling, mass and lump, head: Secondary | ICD-10-CM | POA: Insufficient documentation

## 2012-07-13 DIAGNOSIS — R059 Cough, unspecified: Secondary | ICD-10-CM | POA: Insufficient documentation

## 2012-07-13 DIAGNOSIS — Z79899 Other long term (current) drug therapy: Secondary | ICD-10-CM | POA: Insufficient documentation

## 2012-07-13 DIAGNOSIS — R509 Fever, unspecified: Secondary | ICD-10-CM | POA: Insufficient documentation

## 2012-07-13 DIAGNOSIS — R05 Cough: Secondary | ICD-10-CM | POA: Insufficient documentation

## 2012-07-13 DIAGNOSIS — J069 Acute upper respiratory infection, unspecified: Secondary | ICD-10-CM | POA: Insufficient documentation

## 2012-07-13 DIAGNOSIS — F17200 Nicotine dependence, unspecified, uncomplicated: Secondary | ICD-10-CM | POA: Insufficient documentation

## 2012-07-13 DIAGNOSIS — R011 Cardiac murmur, unspecified: Secondary | ICD-10-CM | POA: Insufficient documentation

## 2012-07-13 DIAGNOSIS — J3489 Other specified disorders of nose and nasal sinuses: Secondary | ICD-10-CM | POA: Insufficient documentation

## 2012-07-13 MED ORDER — ALBUTEROL SULFATE (5 MG/ML) 0.5% IN NEBU
2.5000 mg | INHALATION_SOLUTION | RESPIRATORY_TRACT | Status: DC | PRN
  Administered 2012-07-13: 2.5 mg via RESPIRATORY_TRACT
  Filled 2012-07-13: qty 0.5

## 2012-07-13 MED ORDER — SODIUM CHLORIDE 0.65 % NA SOLN: 1.0000 | NASAL | Status: DC | PRN

## 2012-07-13 MED ORDER — DEXAMETHASONE SODIUM PHOSPHATE 10 MG/ML IJ SOLN
10.0000 mg | Freq: Once | INTRAMUSCULAR | Status: AC
  Administered 2012-07-13: 10 mg via INTRAVENOUS
  Filled 2012-07-13: qty 1

## 2012-07-13 MED ORDER — DIPHENHYDRAMINE HCL 25 MG PO CAPS
25.0000 mg | ORAL_CAPSULE | Freq: Once | ORAL | Status: AC
  Administered 2012-07-13: 25 mg via ORAL
  Filled 2012-07-13: qty 1

## 2012-07-13 NOTE — ED Provider Notes (Signed)
History     CSN: [id number]  Arrival date & time 07/13/12  1031   First MD Initiated Contact with Patient 07/13/12 1032      Chief Complaint  Patient presents with  . Sore Throat    itching throat x 3 days  . Facial Swelling    (Consider location/radiation/quality/duration/timing/severity/associated sxs/prior treatment) HPI   Patient is a 42 yo F presenting with 3 day history of worsening nasal congestion, sinus pressure, and itchy throat. Associated non-productive cough, rhinorrhea, sore throat, one day history of fever 102F (PO), headache x 1 episode on Sunday, and chills. Denies shortness of breath, difficulty breathing, chest tightness, chest pain, abdominal pain, facial swelling, sick contacts. Tried OTC antihistamines including Claritin and Benadryl without relief. Tried home Albuterol inhaler without relief. No aggravating factors.   Past Medical History  Diagnosis Date  . Sickle cell trait   . Interstitial cystitis   . Vitamin D insufficiency   . Headache   . Endometriosis   . Heart murmur   . Asthma     Past Surgical History  Procedure Laterality Date  . Abdominal hysterectomy    . Eye surgery    . Ankle surgery    . Cystectomy    . Bartholin gland cyst excision    . Inguinal hernia repair    . Dilation and curettage of uterus    . Laparoscopy      History reviewed. No pertinent family history.  History  Substance Use Topics  . Smoking status: Current Some Day Smoker -- 0.25 packs/day  . Smokeless tobacco: Not on file  . Alcohol Use: No    OB History   Grav Para Term Preterm Abortions TAB SAB Ect Mult Living   1 1        1       Review of Systems  Constitutional: Positive for fever and chills.  HENT: Positive for congestion, sore throat and sinus pressure. Negative for voice change.   Eyes: Positive for itching.  Respiratory: Positive for cough. Negative for shortness of breath.   Gastrointestinal: Negative for nausea, vomiting and abdominal  pain.  Genitourinary: Negative for difficulty urinating.  Musculoskeletal: Negative for back pain.  Skin: Negative for rash.  Neurological: Negative for light-headedness.    Allergies  Avelox; Amoxicillin-pot clavulanate; Aspirin; Erythromycin; Gabapentin; Oxycodone; Sulfa antibiotics; and Tessalon  Home Medications   Current Outpatient Rx  Name  Route  Sig  Dispense  Refill  . albuterol (PROVENTIL HFA;VENTOLIN HFA) 108 (90 BASE) MCG/ACT inhaler   Inhalation   Inhale 2 puffs into the lungs every 6 (six) hours as needed. Allergies         . cetirizine (ZYRTEC) 10 MG tablet   Oral   Take 10 mg by mouth daily as needed. For allergies         . mometasone (ASMANEX) 220 MCG/INH inhaler   Inhalation   Inhale 2 puffs into the lungs daily.         . naproxen sodium (ANAPROX) 220 MG tablet   Oral   Take 220 mg by mouth 2 (two) times daily with a meal. For pain.         . nebivolol (BYSTOLIC) 5 MG tablet   Oral   Take 5 mg by mouth daily.           BP 162/103  Pulse 71  Temp(Src) 98.5 F (36.9 C) (Oral)  Resp 20  SpO2 97%  Physical Exam  Constitutional: She is oriented to  person, place, and time. She appears well-developed and well-nourished.  HENT:  Head: Normocephalic and atraumatic. No trismus in the jaw.  Right Ear: Tympanic membrane and external ear normal.  Left Ear: Tympanic membrane and external ear normal.  Nose: No rhinorrhea or nasal deformity. No epistaxis. Right sinus exhibits no maxillary sinus tenderness and no frontal sinus tenderness. Left sinus exhibits no maxillary sinus tenderness and no frontal sinus tenderness.  Mouth/Throat: Uvula is midline, oropharynx is clear and moist and mucous membranes are normal. No oral lesions. Normal dentition. No edematous or lacerations. No oropharyngeal exudate, posterior oropharyngeal edema, posterior oropharyngeal erythema or tonsillar abscesses.  Eyes: Conjunctivae are normal. Pupils are equal, round, and  reactive to light.  Neck: Trachea normal. Neck supple.  Cardiovascular: Normal rate, regular rhythm and normal heart sounds.   Pulmonary/Chest: No accessory muscle usage. No respiratory distress. She has decreased breath sounds in the right lower field and the left lower field.  Abdominal: Soft. Bowel sounds are normal. There is no tenderness.  Neurological: She is alert and oriented to person, place, and time.  Skin: Skin is warm and dry. No rash noted.    ED Course  Procedures (including critical care time)  Patient noncompliant with BP medication regimen prescribed by her PCP (Bystolic 5mg  daily).   Patient received nebulizer treatment for asthma. Lung fields improved after nebulizer treatment.   Labs Reviewed  RAPID STREP SCREEN   Dg Chest 2 View  07/13/2012  *RADIOLOGY REPORT*  Clinical Data: Sore throat, facial swelling, chest pain, cough and congestion.  CHEST - 2 VIEW  Comparison: 06/14/2010.  Findings: Trachea is midline.  Heart size normal.  Lungs are clear. No pleural fluid.  IMPRESSION: No acute findings.   Original Report Authenticated By: Leanna Battles, M.D.      1. Viral upper respiratory illness       MDM   Dawn Atkinson is a 42 y.o. female who complains of congestion, sore throat, nasal blockage, dry cough, itching in eyes, fever, chills and bilateral ear itching for 3 days. She denies a history of chest pain, dizziness, nausea, rash, shortness of breath, vomiting, weakness and wheezing and has a history of asthma. Patient does smoke cigarettes. Vital signs are as noted. Ears normal.  Throat and pharynx normal. Neck supple. No adenopathy in the neck. Nose is congested. Sinuses non tender. The chest is clear, without wheezes or rales. Rapid strep test is negative. CXR is negative. Patient most likely has viral upper respiratory illness. Symptomatic therapy suggested: push fluids, use saline nasal sprays, use inhaler for reactive airway exacerbations, rest and return  office visit prn if symptoms persist or worsen. Lack of antibiotic effectiveness discussed with her. Patient agreeable to symptomatic care and treatments. Advised to continue to take her blood pressure medication as prescribed by her PCP for better control and to discuss smoking cessation further with PCP. Return precautions given. Call or return to clinic prn if these symptoms worsen or fail to improve as anticipated. Patient is stable at time of discharge          Jeannetta Ellis, PA-C 07/13/12 1713

## 2012-07-13 NOTE — ED Notes (Signed)
Patient transported to X-ray 

## 2012-07-13 NOTE — ED Notes (Addendum)
Pt reports nasal congestion, sinus pressure, itching throat x 3 days. Sx unresponsive to OTC meds and inhaler Decreased breath sounds posterior noted. No wheezing

## 2012-07-15 NOTE — ED Provider Notes (Signed)
Medical screening examination/treatment/procedure(s) were performed by non-physician practitioner and as supervising physician I was immediately available for consultation/collaboration.   Augustin Bun M Lautaro Koral, DO 07/15/12 1858 

## 2012-10-19 ENCOUNTER — Emergency Department (HOSPITAL_COMMUNITY)
Admission: EM | Admit: 2012-10-19 | Discharge: 2012-10-19 | Disposition: A | Discharge status: Other Acute Inpatient Hospital | Payer: Self-pay | Source: Home / Self Care | Attending: Emergency Medicine | Admitting: Emergency Medicine

## 2012-10-19 ENCOUNTER — Emergency Department (HOSPITAL_COMMUNITY): Payer: Self-pay

## 2012-10-19 ENCOUNTER — Encounter (HOSPITAL_COMMUNITY): Payer: Self-pay

## 2012-10-19 ENCOUNTER — Encounter (HOSPITAL_COMMUNITY): Payer: Self-pay | Admitting: *Deleted

## 2012-10-19 ENCOUNTER — Emergency Department (HOSPITAL_COMMUNITY)
Admission: EM | Admit: 2012-10-19 | Discharge: 2012-10-19 | Disposition: A | Discharge status: Home / Self Care | Payer: Self-pay | Attending: Emergency Medicine | Admitting: Emergency Medicine

## 2012-10-19 DIAGNOSIS — Z8679 Personal history of other diseases of the circulatory system: Secondary | ICD-10-CM | POA: Insufficient documentation

## 2012-10-19 DIAGNOSIS — E559 Vitamin D deficiency, unspecified: Secondary | ICD-10-CM | POA: Insufficient documentation

## 2012-10-19 DIAGNOSIS — J45909 Unspecified asthma, uncomplicated: Secondary | ICD-10-CM | POA: Insufficient documentation

## 2012-10-19 DIAGNOSIS — F17200 Nicotine dependence, unspecified, uncomplicated: Secondary | ICD-10-CM | POA: Insufficient documentation

## 2012-10-19 DIAGNOSIS — Z91199 Patient's noncompliance with other medical treatment and regimen due to unspecified reason: Secondary | ICD-10-CM | POA: Insufficient documentation

## 2012-10-19 DIAGNOSIS — R072 Precordial pain: Secondary | ICD-10-CM | POA: Insufficient documentation

## 2012-10-19 DIAGNOSIS — R011 Cardiac murmur, unspecified: Secondary | ICD-10-CM | POA: Insufficient documentation

## 2012-10-19 DIAGNOSIS — Z8742 Personal history of other diseases of the female genital tract: Secondary | ICD-10-CM | POA: Insufficient documentation

## 2012-10-19 DIAGNOSIS — R609 Edema, unspecified: Secondary | ICD-10-CM

## 2012-10-19 DIAGNOSIS — R079 Chest pain, unspecified: Secondary | ICD-10-CM

## 2012-10-19 DIAGNOSIS — I10 Essential (primary) hypertension: Secondary | ICD-10-CM

## 2012-10-19 DIAGNOSIS — M7989 Other specified soft tissue disorders: Secondary | ICD-10-CM | POA: Insufficient documentation

## 2012-10-19 DIAGNOSIS — Z79899 Other long term (current) drug therapy: Secondary | ICD-10-CM | POA: Insufficient documentation

## 2012-10-19 DIAGNOSIS — Z862 Personal history of diseases of the blood and blood-forming organs and certain disorders involving the immune mechanism: Secondary | ICD-10-CM | POA: Insufficient documentation

## 2012-10-19 DIAGNOSIS — Z9119 Patient's noncompliance with other medical treatment and regimen: Secondary | ICD-10-CM | POA: Insufficient documentation

## 2012-10-19 DIAGNOSIS — Z8619 Personal history of other infectious and parasitic diseases: Secondary | ICD-10-CM | POA: Insufficient documentation

## 2012-10-19 LAB — POCT I-STAT TROPONIN I
Troponin i, poc: 0 ng/mL (ref 0.00–0.08)
Troponin i, poc: 0.01 ng/mL (ref 0.00–0.08)

## 2012-10-19 LAB — BASIC METABOLIC PANEL
CO2: 25 mEq/L (ref 19–32)
Calcium: 9.1 mg/dL (ref 8.4–10.5)
GFR calc Af Amer: >90 mL/min (ref >90)
GFR calc non Af Amer: >90 mL/min (ref >90)
Sodium: 138 mEq/L (ref 135–145)

## 2012-10-19 LAB — CBC
MCH: 29.6 pg (ref 26.0–34.0)
MCHC: 35 g/dL (ref 30.0–36.0)
Platelets: 283 10*3/uL (ref 150–400)
RBC: 4.5 MIL/uL (ref 3.87–5.11)

## 2012-10-19 LAB — POCT URINALYSIS DIP (DEVICE)
Glucose, UA: NEGATIVE mg/dL
Hgb urine dipstick: NEGATIVE
Nitrite: NEGATIVE
Urobilinogen, UA: 0.2 mg/dL (ref 0.0–1.0)

## 2012-10-19 LAB — LIPASE, BLOOD: Lipase: 26 U/L (ref 11–59)

## 2012-10-19 LAB — HEPATIC FUNCTION PANEL
AST: 19 U/L (ref 0–37)
Alkaline Phosphatase: 76 U/L (ref 39–117)
Bilirubin, Direct: <0.1 mg/dL (ref 0.0–0.3)
Total Bilirubin: 0.2 mg/dL — ABNORMAL LOW (ref 0.3–1.2)

## 2012-10-19 MED ORDER — KETOROLAC TROMETHAMINE 60 MG/2ML IM SOLN
60.0000 mg | Freq: Once | INTRAMUSCULAR | Status: AC
  Administered 2012-10-19: 60 mg via INTRAMUSCULAR
  Filled 2012-10-19: qty 2

## 2012-10-19 MED ORDER — NEBIVOLOL HCL 5 MG PO TABS
5.0000 mg | ORAL_TABLET | Freq: Once | ORAL | Status: AC
  Administered 2012-10-19: 5 mg via ORAL

## 2012-10-19 MED ORDER — NEBIVOLOL HCL 5 MG PO TABS: 5.0000 mg | ORAL_TABLET | Freq: Every day | ORAL | Status: DC

## 2012-10-19 MED ORDER — NITROGLYCERIN 0.4 MG SL SUBL
0.4000 mg | SUBLINGUAL_TABLET | SUBLINGUAL | Status: DC | PRN
  Administered 2012-10-19: 0.4 mg via SUBLINGUAL
  Filled 2012-10-19: qty 75

## 2012-10-19 MED ORDER — LISINOPRIL 20 MG PO TABS
20.0000 mg | ORAL_TABLET | Freq: Once | ORAL | Status: AC
  Administered 2012-10-19: 20 mg via ORAL
  Filled 2012-10-19: qty 1

## 2012-10-19 MED ORDER — NEBIVOLOL HCL 5 MG PO TABS
5.0000 mg | ORAL_TABLET | Freq: Every day | ORAL | Status: DC
  Filled 2012-10-19: qty 1

## 2012-10-19 MED ORDER — LISINOPRIL 20 MG PO TABS: 20.0000 mg | ORAL_TABLET | Freq: Every day | ORAL | Status: DC

## 2012-10-19 NOTE — ED Notes (Signed)
States she has history of hypertension last week off and on, swollen ankles last week. some pain off and on in chest; NAD at present, rates pain as "5'

## 2012-10-19 NOTE — ED Provider Notes (Addendum)
Chief Complaint:   Chief Complaint  Patient presents with  . Hypertension    History of Present Illness:   Dawn Atkinson is a 42 year old female with a history of hypertension and cigarette smoking who presents today with chest pain which began around 2 PM today. This stabbing pain rated 5/10 in intensity. It's localized to the left pectoral area with radiation to the back. It is nonpleuritic. It is associated with nausea but no diaphoresis or shortness of breath. She also has been concerned about her blood pressure. She has had high blood pressure for a year and has been on metoprolol and Bystolic, but she's not taking either of these. This morning she had her blood pressure checked at work it was 177/100 and she's had some headaches and some nausea. She also notes puffiness of her hands today and swelling of her ankles off and on since May. Her hands and feet tingle at times. She also has had a history since this morning of pain in her right eye. She denies any redness, tearing, or discharge. Her vision has been normal. She denies any other neurological symptoms.  Review of Systems:  Other than noted above, the patient denies any of the following symptoms. Systemic:  No fever, chills, sweats, or fatigue. ENT:  No nasal congestion, rhinorrhea, or sore throat. Pulmonary:  No cough, wheezing, shortness of breath, sputum production, hemoptysis. Cardiac:  No palpitations, rapid heartbeat, dizziness, presyncope or syncope. GI:  No abdominal pain, heartburn, nausea, or vomiting. Ext:  No leg pain or swelling.  PMFSH:  Past medical history, family history, social history, meds, and allergies were reviewed and updated as needed. She is allergic to aspirin, sulfa, Avelox, and oxycodone.  Physical Exam:   Vital signs:  BP 187/117  Pulse 64  Temp(Src) 98.6 F (37 C) (Oral)  Resp 16  SpO2 99% Gen:  Alert, oriented, in no distress, skin warm and dry. Eye:  PERRL, lids and conjunctivas normal.  Sclera  non-icteric. ENT:  Mucous membranes moist, pharynx clear. Neck:  Supple, no adenopathy or tenderness.  No JVD. Lungs:  Clear to auscultation, no wheezes, rales or rhonchi.  No respiratory distress. Heart:  Regular rhythm.  No gallops, murmers, clicks or rubs. Chest:  Minimal chest wall tenderness in the left pectoral area. Abdomen:  Soft, nontender, no organomegaly or mass.  Bowel sounds normal.  No pulsatile abdominal mass or bruit. Ext:  No edema.  No calf tenderness and Homann's sign negative.  Pulses full and equal. Skin:  Warm and dry.  No rash.  Labs:   Results for orders placed during the hospital encounter of 10/19/12  POCT URINALYSIS DIP (DEVICE)      Result Value Range   Glucose, UA NEGATIVE  NEGATIVE mg/dL   Bilirubin Urine NEGATIVE  NEGATIVE   Ketones, ur NEGATIVE  NEGATIVE mg/dL   Specific Gravity, Urine 1.020  1.005 - 1.030   Hgb urine dipstick NEGATIVE  NEGATIVE   pH 7.0  5.0 - 8.0   Protein, ur NEGATIVE  NEGATIVE mg/dL   Urobilinogen, UA 0.2  0.0 - 1.0 mg/dL   Nitrite NEGATIVE  NEGATIVE   Leukocytes, UA NEGATIVE  NEGATIVE     EKG:   Date: 10/19/2012  Rate: 59  Rhythm: sinus bradycardia  QRS Axis: normal  Intervals: normal  ST/T Wave abnormalities: normal  Conduction Disutrbances:none  Narrative Interpretation: Sinus bradycardia, otherwise normal EKG.  Old EKG Reviewed: none available  Course in Urgent Care Center:   Patient was  not given aspirin since she is allergic to it. She adamantly refuses to be transported via [facility], therefore we cannot start an IV, and cannot give her nitroglycerin. She did consent to be transported by shuttle, and will be sending her right over to the emergency department. She did sign the against medical device form.  Assessment:  The primary encounter diagnosis was Chest pain. Diagnoses of Hypertension and Edema were also pertinent to this visit.  Her blood pressure is elevated and she'll need treatment for that. She needs to be  ruled out for ischemic heart disease and pulmonary embolus. She has adamantly refused to be transported via [facility]. Therefore, will have to transport her by shuttle. She did agree to that. Since she refused [facility] transport, we had her sign an AMA form.   Plan:   1.  The following meds were prescribed:   New Prescriptions   No medications on file   2.  The patient was transported to the emergency room by shuttle, since she refused transport by [facility] after signing the AMA form.   Medical Decision Making:   The patient is a 42 year old female with untreated hypertension and a history of cigarette smoking who presents with a history of left pectoral chest pain since 2 PM today. Her EKG was normal. Her blood pressure was markedly elevated. She refuses to be transported by [facility], therefore she will need to be transported via shuttle.    Reuben Likes, MD 10/19/12 1734  Reuben Likes, MD 10/19/12 [phone]

## 2012-10-19 NOTE — ED Notes (Signed)
Pt re-assessed for pain, pt states "its going down but i don't want another one of those nitro pills. Its nothing like it was before". MD made aware and at bedside for pt evaluation. Plan of care updated by MD and will continue to monitor pt.

## 2012-10-19 NOTE — ED Provider Notes (Signed)
History    CSN: [id number] Arrival date & time 10/19/12  1740  First MD Initiated Contact with Patient 10/19/12 1931     Chief Complaint  Patient presents with  . Chest Pain  . Hypertension   (Consider location/radiation/quality/duration/timing/severity/associated sxs/prior Treatment) The history is provided by the patient.  Dawn Atkinson is a 42 y.o. 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 She has intermittent leg and hand swelling for the last few months. Today she has episode substernal chest pain worse with movement. No shortness of breath and no diaphoresis. She is suppose to take metoprolol and bystolic but hasn't been taking them. She denies history of CAD or stents. Sent from urgent care for evaluation.   Past Medical History  Diagnosis Date  . Sickle cell trait   . Interstitial cystitis   . Vitamin D insufficiency   . Headache(784.0)   . Endometriosis   . Heart murmur   . Asthma    Past Surgical History  Procedure Laterality Date  . Abdominal hysterectomy    . Eye surgery    . Ankle surgery    . Cystectomy    . Bartholin gland cyst excision    . Inguinal hernia repair    . Dilation and curettage of uterus    . Laparoscopy     History reviewed. No pertinent family history. History  Substance Use Topics  . Smoking status: Current Some Day Smoker -- 0.25 packs/day  . Smokeless tobacco: Not on file  . Alcohol Use: No   OB History   Grav Para Term Preterm Abortions TAB SAB Ect Mult Living   1 1        1      Review of Systems  Cardiovascular: Positive for chest pain.  All other systems reviewed and are negative.    Allergies  Avelox; Amoxicillin-pot clavulanate; Aspirin; Erythromycin; Gabapentin; Oxycodone; Sulfa antibiotics; and Tessalon  Home Medications   Current Outpatient Rx  Name  Route   Sig  Dispense  Refill  . albuterol (PROVENTIL HFA;VENTOLIN HFA) 108 (90 BASE) MCG/ACT inhaler   Inhalation   Inhale 2 puffs into the lungs every 6 (six) hours as needed for wheezing or shortness of breath.          . BEE POLLEN PO   Oral   Take 1 capsule by mouth daily.         . cetirizine (ZYRTEC) 10 MG tablet   Oral   Take 10 mg by mouth daily as needed. For allergies         . Cod Liver Oil CAPS   Oral   Take 1 capsule by mouth daily.         Marland Kitchen CRANBERRY PO   Oral   Take 1 capsule by mouth daily.         Marland Kitchen GARCINIA CAMBOGIA-CHROMIUM PO   Oral   Take 1 tablet by mouth daily.         Marland Kitchen HYDROcodone-acetaminophen (NORCO/VICODIN) 5-325 MG per tablet   Oral   Take 2 tablets by mouth every 6 (six) hours as needed for pain.         Marland Kitchen MILK THISTLE PO   Oral   Take 1 capsule by mouth daily.         . Multiple Vitamin (MULTIVITAMIN WITH MINERALS) TABS   Oral  Take 1 tablet by mouth daily.         . naproxen sodium (ANAPROX) 220 MG tablet   Oral   Take 220 mg by mouth 2 (two) times daily as needed (pain).          Marland Kitchen VITAMIN E PO   Oral   Take 1 capsule by mouth every other day.          BP 125/89  Pulse 59  Temp(Src) 98.5 F (36.9 C) (Oral)  Resp 22  SpO2 100% Physical Exam  Nursing note and vitals reviewed. Constitutional: She is oriented to person, place, and time. She appears well-developed and well-nourished.  HENT:  Head: Normocephalic.  Mouth/Throat: Oropharynx is clear and moist.  Eyes: Conjunctivae are normal. Pupils are equal, round, and reactive to light.  Neck: Normal range of motion. Neck supple.  Cardiovascular: Normal rate, regular rhythm and normal heart sounds.   Pulmonary/Chest: Effort normal and breath sounds normal. No respiratory distress. She has no wheezes. She has no rales.  ? Tenderness on palpation of sternum   Abdominal: Soft.  Mild epigastric tenderness   Musculoskeletal: Normal range of motion.  Neurological:  She is alert and oriented to person, place, and time.  Skin: Skin is warm and dry.  Psychiatric: She has a normal mood and affect. Her behavior is normal. Judgment and thought content normal.    ED Course  Procedures (including critical care time) Labs Reviewed  HEPATIC FUNCTION PANEL - Abnormal; Notable for the following:    Total Bilirubin 0.2 (*)    All other components within normal limits  CBC  BASIC METABOLIC PANEL  LIPASE, BLOOD  POCT I-STAT TROPONIN I  POCT I-STAT TROPONIN I   Dg Chest 2 View  10/19/2012   *RADIOLOGY REPORT*  Clinical Data: Left chest pain radiating to back, shortness of breath  CHEST - 2 VIEW  Comparison: 07/13/2012  Findings: Lungs are clear. No pleural effusion or pneumothorax.  The heart is normal in size.  Visualized osseous structures are within normal limits.  IMPRESSION: No evidence of acute cardiopulmonary disease.   Original Report Authenticated By: Charline Bills, M.D.   No diagnosis found.   Date: 10/19/2012  Rate: 59  Rhythm: sinus bradycardia  QRS Axis: normal  Intervals: normal  ST/T Wave abnormalities: nonspecific ST changes  Conduction Disutrbances:none  Narrative Interpretation:   Old EKG Reviewed: unchanged    MDM  Dawn Atkinson is a 42 y.o. female here with chest pain, hypertension. Will get trop x 2 as she is low risk. Likely MSK though so will give toradol. Will give PO BP meds and I doubt that this is hypertensive urgency. Will reassess.   10:45 PM Trop neg x 2. Labs unremarkable. CXR nl. BP dec to 160s with lisinopril and bystolic. Will d/c home with same.    Richardean Canal, MD 10/19/12 [phone]

## 2012-10-19 NOTE — ED Notes (Signed)
Pt sent here from [facility] for eval of cp. Reports left side chest pains started yesterday. Having nausea and swelling to hands. bp 185/98 at triage.

## 2012-10-19 NOTE — ED Notes (Signed)
Pt denies any pain or questions upon discharge. 

## 2013-01-06 ENCOUNTER — Emergency Department (HOSPITAL_COMMUNITY)
Admission: EM | Admit: 2013-01-06 | Discharge: 2013-01-06 | Disposition: A | Discharge status: Home / Self Care | Payer: PRIVATE HEALTH INSURANCE | Attending: Emergency Medicine | Admitting: Emergency Medicine

## 2013-01-06 ENCOUNTER — Emergency Department (HOSPITAL_COMMUNITY): Payer: PRIVATE HEALTH INSURANCE

## 2013-01-06 ENCOUNTER — Encounter (HOSPITAL_COMMUNITY): Payer: Self-pay | Admitting: *Deleted

## 2013-01-06 DIAGNOSIS — W06XXXA Fall from bed, initial encounter: Secondary | ICD-10-CM | POA: Insufficient documentation

## 2013-01-06 DIAGNOSIS — Y9389 Activity, other specified: Secondary | ICD-10-CM | POA: Insufficient documentation

## 2013-01-06 DIAGNOSIS — Y9289 Other specified places as the place of occurrence of the external cause: Secondary | ICD-10-CM | POA: Insufficient documentation

## 2013-01-06 DIAGNOSIS — J45909 Unspecified asthma, uncomplicated: Secondary | ICD-10-CM | POA: Insufficient documentation

## 2013-01-06 DIAGNOSIS — M25562 Pain in left knee: Secondary | ICD-10-CM

## 2013-01-06 DIAGNOSIS — Z8639 Personal history of other endocrine, nutritional and metabolic disease: Secondary | ICD-10-CM | POA: Insufficient documentation

## 2013-01-06 DIAGNOSIS — F17200 Nicotine dependence, unspecified, uncomplicated: Secondary | ICD-10-CM | POA: Insufficient documentation

## 2013-01-06 DIAGNOSIS — S8990XA Unspecified injury of unspecified lower leg, initial encounter: Secondary | ICD-10-CM | POA: Insufficient documentation

## 2013-01-06 DIAGNOSIS — R011 Cardiac murmur, unspecified: Secondary | ICD-10-CM | POA: Insufficient documentation

## 2013-01-06 DIAGNOSIS — Z79899 Other long term (current) drug therapy: Secondary | ICD-10-CM | POA: Insufficient documentation

## 2013-01-06 DIAGNOSIS — Z880 Allergy status to penicillin: Secondary | ICD-10-CM | POA: Insufficient documentation

## 2013-01-06 DIAGNOSIS — D573 Sickle-cell trait: Secondary | ICD-10-CM | POA: Insufficient documentation

## 2013-01-06 DIAGNOSIS — Z8742 Personal history of other diseases of the female genital tract: Secondary | ICD-10-CM | POA: Insufficient documentation

## 2013-01-06 DIAGNOSIS — S8992XA Unspecified injury of left lower leg, initial encounter: Secondary | ICD-10-CM

## 2013-01-06 MED ORDER — HYDROCODONE-ACETAMINOPHEN 5-325 MG PO TABS
1.0000 | ORAL_TABLET | Freq: Once | ORAL | Status: AC
  Administered 2013-01-06: 1 via ORAL
  Filled 2013-01-06: qty 1

## 2013-01-06 MED ORDER — IBUPROFEN 800 MG PO TABS: 800.0000 mg | ORAL_TABLET | Freq: Three times a day (TID) | ORAL | Status: DC

## 2013-01-06 MED ORDER — ACETAMINOPHEN 325 MG PO TABS
650.0000 mg | ORAL_TABLET | Freq: Once | ORAL | Status: AC
  Administered 2013-01-06: 650 mg via ORAL
  Filled 2013-01-06: qty 2

## 2013-01-06 MED ORDER — HYDROCODONE-ACETAMINOPHEN 5-325 MG PO TABS: 1.0000 | ORAL_TABLET | ORAL | Status: DC | PRN

## 2013-01-06 NOTE — ED Provider Notes (Signed)
CSN: [id number]     Arrival date & time 01/06/13  0324 History   First MD Initiated Contact with Patient 01/06/13 [phone]     Chief Complaint  Patient presents with  . Knee Pain   (Consider location/radiation/quality/duration/timing/severity/associated sxs/prior Treatment) Patient is a 42 y.o. female presenting with knee pain. The history is provided by the patient and medical records. No language interpreter was used.  Knee Pain Associated symptoms: no back pain, no fever and no neck pain     Dawn Atkinson is a 42 y.o. female  with a hx of sickle cell trait, IC, asthma presents to the Emergency Department complaining of acute, persistent, left knee pain after falling onto her L knee onset 2:30am after jumping out of bed. Associated symptoms include pain with ambulation.  nothing makes it better and movement, walking makes it worse.  Pt denies fever, chills, headache, neck pain, chest pain, SOB, back pain, abd pain, N/V/D, weakness, numbness, tingling, dysuria.  Pt did not attempt to take any medication for the pain.      Past Medical History  Diagnosis Date  . Sickle cell trait   . Interstitial cystitis   . Vitamin D insufficiency   . Headache(784.0)   . Endometriosis   . Heart murmur   . Asthma    Past Surgical History  Procedure Laterality Date  . Abdominal hysterectomy    . Eye surgery    . Ankle surgery    . Cystectomy    . Bartholin gland cyst excision    . Inguinal hernia repair    . Dilation and curettage of uterus    . Laparoscopy     No family history on file. History  Substance Use Topics  . Smoking status: Current Some Day Smoker -- 0.25 packs/day  . Smokeless tobacco: Not on file  . Alcohol Use: No   OB History   Grav Para Term Preterm Abortions TAB SAB Ect Mult Living   1 1        1      Review of Systems  Constitutional: Negative for fever.  HENT: Negative for neck pain and neck stiffness.   Respiratory: Negative for shortness of breath and wheezing.    Cardiovascular: Negative for chest pain.  Gastrointestinal: Negative for nausea, vomiting and abdominal pain.  Musculoskeletal: Positive for arthralgias and gait problem. Negative for back pain and joint swelling.  Skin: Negative for color change and rash.  Allergic/Immunologic: Negative for immunocompromised state.  Neurological: Negative for syncope.  Hematological: Does not bruise/bleed easily.  Psychiatric/Behavioral: Negative for sleep disturbance. The patient is not nervous/anxious.     Allergies  Avelox; Amoxicillin-pot clavulanate; Aspirin; Erythromycin; Gabapentin; Oxycodone; Sulfa antibiotics; and Tessalon  Home Medications   Current Outpatient Rx  Name  Route  Sig  Dispense  Refill  . albuterol (PROVENTIL HFA;VENTOLIN HFA) 108 (90 BASE) MCG/ACT inhaler   Inhalation   Inhale 2 puffs into the lungs every 6 (six) hours as needed for wheezing or shortness of breath.          . BEE POLLEN PO   Oral   Take 1 capsule by mouth daily.         . cetirizine (ZYRTEC) 10 MG tablet   Oral   Take 10 mg by mouth daily as needed. For allergies         . Cod Liver Oil CAPS   Oral   Take 1 capsule by mouth daily.         Marland Kitchen  CRANBERRY PO   Oral   Take 1 capsule by mouth daily.         Marland Kitchen GARCINIA CAMBOGIA-CHROMIUM PO   Oral   Take 1 tablet by mouth daily.         Marland Kitchen MILK THISTLE PO   Oral   Take 1 capsule by mouth daily.         . Multiple Vitamin (MULTIVITAMIN WITH MINERALS) TABS   Oral   Take 1 tablet by mouth daily.         . naproxen sodium (ANAPROX) 220 MG tablet   Oral   Take 220 mg by mouth 2 (two) times daily as needed (pain).          . nebivolol (BYSTOLIC) 5 MG tablet   Oral   Take 1 tablet (5 mg total) by mouth daily.   30 tablet   0   . VITAMIN E PO   Oral   Take 1 capsule by mouth every other day.         Marland Kitchen HYDROcodone-acetaminophen (NORCO/VICODIN) 5-325 MG per tablet   Oral   Take 1 tablet by mouth every 4 (four) hours as needed  for pain.   11 tablet   0   . ibuprofen (ADVIL,MOTRIN) 800 MG tablet   Oral   Take 1 tablet (800 mg total) by mouth 3 (three) times daily.   21 tablet   0    BP 148/89  Pulse 88  Temp(Src) 99 F (37.2 C)  Resp 20  SpO2 98% Physical Exam  Nursing note and vitals reviewed. Constitutional: She appears well-developed and well-nourished. No distress.  HENT:  Head: Normocephalic and atraumatic.  Mouth/Throat: Oropharynx is clear and moist.  Eyes: Conjunctivae are normal. Pupils are equal, round, and reactive to light.  Neck: Normal range of motion.  Cardiovascular: Normal rate, regular rhythm, normal heart sounds and intact distal pulses.   Capillary refill < 3 sec  Pulmonary/Chest: Effort normal and breath sounds normal. No respiratory distress. She has no wheezes.  Musculoskeletal: She exhibits tenderness. She exhibits no edema.  ROM: full ROM to bilateral knees with with pain to the left knee  Neurological: She is alert. Coordination normal.  Sensation intact to dull and sharp Strength 5/5 in the bilateral lower extremities Pt ambulates with pain  Skin: Skin is warm and dry. No rash noted. She is not diaphoretic. No erythema.  No tenting of the skin  Psychiatric: She has a normal mood and affect.    ED Course  Procedures (including critical care time) Labs Review Labs Reviewed - No data to display Imaging Review Dg Knee Complete 4 Views Left  01/06/2013   CLINICAL DATA:  Trauma with pain.  EXAM: LEFT KNEE - COMPLETE 4+ VIEW  COMPARISON:  None.  FINDINGS: There is no evidence of fracture, dislocation, or joint effusion. There is no evidence of arthropathy or other focal bone abnormality. Soft tissues are unremarkable.  IMPRESSION: Negative.   Electronically Signed   By: Tiburcio Pea   On: 01/06/2013 04:19    MDM   1. Left knee pain   2. Left knee injury, initial encounter      Dawn Atkinson presents with L knee pain after a fall.  Patient X-Ray negative for  obvious fracture or dislocation. I personally reviewed the imaging tests through PACS system.  I reviewed available ER/hospitalization records through the EMR.  Pain managed in ED. Pt advised to follow up with orthopedics if symptoms persist  for possibility of missed fracture diagnosis. Patient given brace while in ED, conservative therapy recommended and discussed. Patient will be dc home & is agreeable with above plan.  It has been determined that no acute conditions requiring further emergency intervention are present at this time. The patient/guardian have been advised of the diagnosis and plan. We have discussed signs and symptoms that warrant return to the ED, such as changes or worsening in symptoms.   Vital signs are stable at discharge.   BP 148/89  Pulse 88  Temp(Src) 99 F (37.2 C)  Resp 20  SpO2 98%  Patient/guardian has voiced understanding and agreed to follow-up with the PCP or specialist.         Dierdre Forth, PA-C 01/06/13 [phone]

## 2013-01-06 NOTE — ED Provider Notes (Signed)
Medical screening examination/treatment/procedure(s) were performed by non-physician practitioner and as supervising physician I was immediately available for consultation/collaboration.  Olivia Mackie, MD 01/06/13 [phone]

## 2013-01-06 NOTE — ED Notes (Signed)
Pt jumped out of bed quickly; twisted left knee; pain with ambulation

## 2013-07-12 ENCOUNTER — Emergency Department (HOSPITAL_COMMUNITY): Payer: PRIVATE HEALTH INSURANCE

## 2013-07-12 ENCOUNTER — Emergency Department (HOSPITAL_COMMUNITY)
Admission: EM | Admit: 2013-07-12 | Discharge: 2013-07-12 | Disposition: A | Discharge status: Home / Self Care | Payer: PRIVATE HEALTH INSURANCE | Attending: Emergency Medicine | Admitting: Emergency Medicine

## 2013-07-12 ENCOUNTER — Encounter (HOSPITAL_COMMUNITY): Payer: Self-pay | Admitting: Emergency Medicine

## 2013-07-12 DIAGNOSIS — Z8742 Personal history of other diseases of the female genital tract: Secondary | ICD-10-CM | POA: Insufficient documentation

## 2013-07-12 DIAGNOSIS — Z79899 Other long term (current) drug therapy: Secondary | ICD-10-CM | POA: Insufficient documentation

## 2013-07-12 DIAGNOSIS — Z90710 Acquired absence of both cervix and uterus: Secondary | ICD-10-CM | POA: Insufficient documentation

## 2013-07-12 DIAGNOSIS — R079 Chest pain, unspecified: Secondary | ICD-10-CM

## 2013-07-12 DIAGNOSIS — IMO0002 Reserved for concepts with insufficient information to code with codable children: Secondary | ICD-10-CM | POA: Insufficient documentation

## 2013-07-12 DIAGNOSIS — Z862 Personal history of diseases of the blood and blood-forming organs and certain disorders involving the immune mechanism: Secondary | ICD-10-CM | POA: Insufficient documentation

## 2013-07-12 DIAGNOSIS — Z8639 Personal history of other endocrine, nutritional and metabolic disease: Secondary | ICD-10-CM | POA: Insufficient documentation

## 2013-07-12 DIAGNOSIS — Z880 Allergy status to penicillin: Secondary | ICD-10-CM | POA: Insufficient documentation

## 2013-07-12 DIAGNOSIS — Z87448 Personal history of other diseases of urinary system: Secondary | ICD-10-CM | POA: Insufficient documentation

## 2013-07-12 DIAGNOSIS — R6884 Jaw pain: Secondary | ICD-10-CM | POA: Insufficient documentation

## 2013-07-12 DIAGNOSIS — R011 Cardiac murmur, unspecified: Secondary | ICD-10-CM | POA: Insufficient documentation

## 2013-07-12 DIAGNOSIS — J45909 Unspecified asthma, uncomplicated: Secondary | ICD-10-CM | POA: Insufficient documentation

## 2013-07-12 DIAGNOSIS — Z9889 Other specified postprocedural states: Secondary | ICD-10-CM | POA: Insufficient documentation

## 2013-07-12 DIAGNOSIS — M542 Cervicalgia: Secondary | ICD-10-CM

## 2013-07-12 DIAGNOSIS — F17200 Nicotine dependence, unspecified, uncomplicated: Secondary | ICD-10-CM | POA: Insufficient documentation

## 2013-07-12 DIAGNOSIS — R110 Nausea: Secondary | ICD-10-CM | POA: Insufficient documentation

## 2013-07-12 DIAGNOSIS — R071 Chest pain on breathing: Secondary | ICD-10-CM | POA: Insufficient documentation

## 2013-07-12 DIAGNOSIS — R1011 Right upper quadrant pain: Secondary | ICD-10-CM

## 2013-07-12 LAB — URINALYSIS, ROUTINE W REFLEX MICROSCOPIC
BILIRUBIN URINE: NEGATIVE
Glucose, UA: NEGATIVE mg/dL
Hgb urine dipstick: NEGATIVE
Ketones, ur: NEGATIVE mg/dL
LEUKOCYTES UA: NEGATIVE
NITRITE: NEGATIVE
Protein, ur: NEGATIVE mg/dL
SPECIFIC GRAVITY, URINE: 1.01 (ref 1.005–1.030)
UROBILINOGEN UA: 0.2 mg/dL (ref 0.0–1.0)
pH: 7 (ref 5.0–8.0)

## 2013-07-12 LAB — I-STAT TROPONIN, ED: Troponin i, poc: 0 ng/mL (ref 0.00–0.08)

## 2013-07-12 LAB — HEPATIC FUNCTION PANEL
ALBUMIN: 4.1 g/dL (ref 3.5–5.2)
ALT: 11 U/L (ref 0–35)
AST: 18 U/L (ref 0–37)
Alkaline Phosphatase: 86 U/L (ref 39–117)
Bilirubin, Direct: <0.2 mg/dL (ref 0.0–0.3)
TOTAL PROTEIN: 7.8 g/dL (ref 6.0–8.3)
Total Bilirubin: 0.3 mg/dL (ref 0.3–1.2)

## 2013-07-12 LAB — CBC
HEMATOCRIT: 40.7 % (ref 36.0–46.0)
Hemoglobin: 14 g/dL (ref 12.0–15.0)
MCH: 29.1 pg (ref 26.0–34.0)
MCHC: 34.4 g/dL (ref 30.0–36.0)
MCV: 84.6 fL (ref 78.0–100.0)
PLATELETS: 283 10*3/uL (ref 150–400)
RBC: 4.81 MIL/uL (ref 3.87–5.11)
RDW: 13.7 % (ref 11.5–15.5)
WBC: 14.3 10*3/uL — AB (ref 4.0–10.5)

## 2013-07-12 LAB — BASIC METABOLIC PANEL
BUN: 15 mg/dL (ref 6–23)
CALCIUM: 10 mg/dL (ref 8.4–10.5)
CO2: 25 meq/L (ref 19–32)
Chloride: 100 mEq/L (ref 96–112)
Creatinine, Ser: 0.77 mg/dL (ref 0.50–1.10)
GFR calc Af Amer: >90 mL/min (ref >90)
Glucose, Bld: 111 mg/dL — ABNORMAL HIGH (ref 70–99)
Potassium: 3.7 mEq/L (ref 3.7–5.3)
SODIUM: 139 meq/L (ref 137–147)

## 2013-07-12 LAB — PRO B NATRIURETIC PEPTIDE: PRO B NATRI PEPTIDE: 44.8 pg/mL (ref 0–125)

## 2013-07-12 LAB — LIPASE, BLOOD: LIPASE: 24 U/L (ref 11–59)

## 2013-07-12 MED ORDER — DIPHENHYDRAMINE HCL 25 MG PO CAPS
25.0000 mg | ORAL_CAPSULE | Freq: Once | ORAL | Status: AC
  Administered 2013-07-12: 25 mg via ORAL
  Filled 2013-07-12: qty 1

## 2013-07-12 MED ORDER — HYDROMORPHONE HCL PF 1 MG/ML IJ SOLN
1.0000 mg | Freq: Once | INTRAMUSCULAR | Status: AC
  Administered 2013-07-12: 1 mg via INTRAVENOUS
  Filled 2013-07-12: qty 1

## 2013-07-12 MED ORDER — ONDANSETRON HCL 4 MG/2ML IJ SOLN
4.0000 mg | Freq: Once | INTRAMUSCULAR | Status: AC
  Administered 2013-07-12: 4 mg via INTRAVENOUS
  Filled 2013-07-12: qty 2

## 2013-07-12 NOTE — ED Notes (Signed)
Pt c/o right sided pain that radiates to arm and neck. States it hurts when she takes a deep breath. Pt states she feel fatigue.

## 2013-07-12 NOTE — ED Notes (Signed)
Pt alert and oriented x4. Respirations even and unlabored, bilateral symmetrical rise and fall of chest. Skin warm and dry. In no acute distress. Denies needs.   

## 2013-07-12 NOTE — ED Provider Notes (Signed)
CSN: [id number]     Arrival date & time 07/12/13  1609 History   First MD Initiated Contact with Patient 07/12/13 1746     Chief Complaint  Patient presents with  . Chest Pain     (Consider location/radiation/quality/duration/timing/severity/associated sxs/prior Treatment) Patient is a 43 y.o. female presenting with chest pain.  Chest Pain Pain location:  R chest (under right breast) Pain quality: sharp   Pain radiates to:  Does not radiate Pain severity:  Severe Onset quality:  Gradual Duration:  1 day Timing:  Constant Progression:  Unchanged Chronicity:  New Relieved by:  Nothing Worsened by:  Nothing tried Associated symptoms: nausea   Associated symptoms: no abdominal pain, no cough, no fever, no shortness of breath and not vomiting   Associated symptoms comment:  Right jaw pain   Past Medical History  Diagnosis Date  . Sickle cell trait   . Interstitial cystitis   . Vitamin D insufficiency   . Headache(784.0)   . Endometriosis   . Heart murmur   . Asthma    Past Surgical History  Procedure Laterality Date  . Abdominal hysterectomy    . Eye surgery    . Ankle surgery    . Cystectomy    . Bartholin gland cyst excision    . Inguinal hernia repair    . Dilation and curettage of uterus    . Laparoscopy     No family history on file. History  Substance Use Topics  . Smoking status: Current Some Day Smoker -- 0.25 packs/day  . Smokeless tobacco: Not on file  . Alcohol Use: No   OB History   Grav Para Term Preterm Abortions TAB SAB Ect Mult Living   1 1        1      Review of Systems  Constitutional: Negative for fever.  Respiratory: Negative for cough and shortness of breath.   Cardiovascular: Positive for chest pain.  Gastrointestinal: Positive for nausea. Negative for vomiting, abdominal pain and diarrhea.  All other systems reviewed and are negative.      Allergies  Avelox; Amoxicillin-pot clavulanate; Aspirin; Erythromycin; Gabapentin;  Lisinopril; Oxycodone; Sulfa antibiotics; and Tessalon  Home Medications   Current Outpatient Rx  Name  Route  Sig  Dispense  Refill  . albuterol (PROVENTIL HFA;VENTOLIN HFA) 108 (90 BASE) MCG/ACT inhaler   Inhalation   Inhale 2 puffs into the lungs every 6 (six) hours as needed for wheezing or shortness of breath.          . beclomethasone (QVAR) 40 MCG/ACT inhaler   Inhalation   Inhale 2 puffs into the lungs 3 (three) times a week.         Marland Kitchen BEE POLLEN PO   Oral   Take 1 capsule by mouth daily.         . cetirizine (ZYRTEC) 10 MG tablet   Oral   Take 10 mg by mouth daily as needed. For allergies         . Cod Liver Oil CAPS   Oral   Take 1 capsule by mouth daily.         Marland Kitchen CRANBERRY PO   Oral   Take 1 capsule by mouth daily.         Marland Kitchen GARCINIA CAMBOGIA-CHROMIUM PO   Oral   Take 1 tablet by mouth daily.         Marland Kitchen ibuprofen (ADVIL,MOTRIN) 200 MG tablet   Oral   Take 400 mg by  mouth every 8 (eight) hours as needed for mild pain.         Marland Kitchen ibuprofen (ADVIL,MOTRIN) 800 MG tablet   Oral   Take 800 mg by mouth daily as needed.         . Multiple Vitamin (MULTIVITAMIN WITH MINERALS) TABS   Oral   Take 1 tablet by mouth daily.         Marland Kitchen VITAMIN E PO   Oral   Take 1 capsule by mouth every other day.          BP 146/88  Pulse 94  Temp(Src) 98.8 F (37.1 C) (Oral)  Resp 21  SpO2 100% Physical Exam  Nursing note and vitals reviewed. Constitutional: She is oriented to person, place, and time. She appears well-developed and well-nourished. No distress.  HENT:  Head: Normocephalic and atraumatic.  Right Ear: Hearing, tympanic membrane, external ear and ear canal normal.  Left Ear: Hearing, tympanic membrane, external ear and ear canal normal.  Mouth/Throat: Oropharynx is clear and moist. No trismus in the jaw.  No dental tenderness  Eyes: Conjunctivae are normal. Pupils are equal, round, and reactive to light. No scleral icterus.  Neck:  Normal range of motion. Neck supple. Muscular tenderness (right lateral neck) present. No rigidity. No edema present.  No redness, no swelling. No LAD.  Cardiovascular: Normal rate, regular rhythm, normal heart sounds and intact distal pulses.   No murmur heard. Pulmonary/Chest: Effort normal and breath sounds normal. No stridor. No respiratory distress. She has no decreased breath sounds. She has no wheezes. She has no rales. She exhibits tenderness (right low anterior chest wall). She exhibits no crepitus, no edema, no deformity and no swelling.  No rash  Abdominal: Soft. Bowel sounds are normal. She exhibits no distension. There is tenderness in the right upper quadrant. There is positive Murphy's sign. There is no rigidity and no rebound.  Musculoskeletal: Normal range of motion.  Neurological: She is alert and oriented to person, place, and time.  Skin: Skin is warm and dry. No rash noted.  Psychiatric: She has a normal mood and affect. Her behavior is normal.    ED Course  Procedures (including critical care time) Labs Review Labs Reviewed  CBC - Abnormal; Notable for the following:    WBC 14.3 (*)    All other components within normal limits  BASIC METABOLIC PANEL - Abnormal; Notable for the following:    Glucose, Bld 111 (*)    All other components within normal limits  PRO B NATRIURETIC PEPTIDE  HEPATIC FUNCTION PANEL  LIPASE, BLOOD  URINALYSIS, ROUTINE W REFLEX MICROSCOPIC  I-STAT TROPOININ, ED   Imaging Review Dg Chest 2 View  07/12/2013   CLINICAL DATA:  Right-sided chest pain  EXAM: CHEST  2 VIEW  COMPARISON:  10/19/2012  FINDINGS: Cardiac shadow is within normal limits. Lungs are well aerated bilaterally. No focal infiltrate or sizable effusion is seen. No acute bony abnormality is noted.  IMPRESSION: No active cardiopulmonary disease.   Electronically Signed   By: Alcide Clever M.D.   On: 07/12/2013 16:54  Plain film radiology studies independently viewed by me.       EKG Interpretation   Date/Time:  Wednesday July 12 2013 16:33:55 EDT Ventricular Rate:  88 PR Interval:  157 QRS Duration: 85 QT Interval:  358 QTC Calculation: 433 R Axis:   58 Text Interpretation:  Sinus rhythm Probable left atrial enlargement No  significant change was found Confirmed by [facility]  MD,  TREY ([id number]) on  07/12/2013 6:29:26 PM      MDM   Final diagnoses:  RUQ abdominal pain  Chest pain  Neck pain    43 yo female with right low chest pain with associated right neck/jaw pain.  On exam, seems more tender in RUQ of abdomen.  Neck pain could possibly be referred, but she is also tender without local identifiable cause.  Doubt ACS.  Doubt PE and PERC negative.  Plan RUQ US.  If negative, chest/right upper quadrant abdominal pain is likely musculoskeletal in nature.  Remainder of workup is negative. Patient feels better at this time. Plan DC home with return precautions.  Candyce ChurnJohn David Baylon Santelli III, MD 07/12/13 2322

## 2013-07-12 NOTE — Discharge Instructions (Signed)
Abdominal Pain, Women °Abdominal (stomach, pelvic, or belly) pain can be caused by many things. It is important to tell your doctor: °· The location of the pain. °· Does it come and go or is it present all the time? °· Are there things that start the pain (eating certain foods, exercise)? °· Are there other symptoms associated with the pain (fever, nausea, vomiting, diarrhea)? °All of this is helpful to know when trying to find the cause of the pain. °CAUSES  °· Stomach: virus or bacteria infection, or ulcer. °· Intestine: appendicitis (inflamed appendix), regional ileitis (Crohn's disease), ulcerative colitis (inflamed colon), irritable bowel syndrome, diverticulitis (inflamed diverticulum of the colon), or cancer of the stomach or intestine. °· Gallbladder disease or stones in the gallbladder. °· Kidney disease, kidney stones, or infection. °· Pancreas infection or cancer. °· Fibromyalgia (pain disorder). °· Diseases of the female organs: °· Uterus: fibroid (non-cancerous) tumors or infection. °· Fallopian tubes: infection or tubal pregnancy. °· Ovary: cysts or tumors. °· Pelvic adhesions (scar tissue). °· Endometriosis (uterus lining tissue growing in the pelvis and on the pelvic organs). °· Pelvic congestion syndrome (female organs filling up with blood just before the menstrual period). °· Pain with the menstrual period. °· Pain with ovulation (producing an egg). °· Pain with an IUD (intrauterine device, birth control) in the uterus. °· Cancer of the female organs. °· Functional pain (pain not caused by a disease, may improve without treatment). °· Psychological pain. °· Depression. °DIAGNOSIS  °Your doctor will decide the seriousness of your pain by doing an examination. °· Blood tests. °· X-rays. °· Ultrasound. °· CT scan (computed tomography, special type of X-ray). °· MRI (magnetic resonance imaging). °· Cultures, for infection. °· Barium enema (dye inserted in the large intestine, to better view it with  X-rays). °· Colonoscopy (looking in intestine with a lighted tube). °· Laparoscopy (minor surgery, looking in abdomen with a lighted tube). °· Major abdominal exploratory surgery (looking in abdomen with a large incision). °TREATMENT  °The treatment will depend on the cause of the pain.  °· Many cases can be observed and treated at home. °· Over-the-counter medicines recommended by your caregiver. °· Prescription medicine. °· Antibiotics, for infection. °· Birth control pills, for painful periods or for ovulation pain. °· Hormone treatment, for endometriosis. °· Nerve blocking injections. °· Physical therapy. °· Antidepressants. °· Counseling with a psychologist or psychiatrist. °· Minor or major surgery. °HOME CARE INSTRUCTIONS  °· Do not take laxatives, unless directed by your caregiver. °· Take over-the-counter pain medicine only if ordered by your caregiver. Do not take aspirin because it can cause an upset stomach or bleeding. °· Try a clear liquid diet (broth or water) as ordered by your caregiver. Slowly move to a bland diet, as tolerated, if the pain is related to the stomach or intestine. °· Have a thermometer and take your temperature several times a day, and record it. °· Bed rest and sleep, if it helps the pain. °· Avoid sexual intercourse, if it causes pain. °· Avoid stressful situations. °· Keep your follow-up appointments and tests, as your caregiver orders. °· If the pain does not go away with medicine or surgery, you may try: °· Acupuncture. °· Relaxation exercises (yoga, meditation). °· Group therapy. °· Counseling. °SEEK MEDICAL CARE IF:  °· You notice certain foods cause stomach pain. °· Your home care treatment is not helping your pain. °· You need stronger pain medicine. °· You want your IUD removed. °· You feel faint or   lightheaded.  You develop nausea and vomiting.  You develop a rash.  You are having side effects or an allergy to your medicine. SEEK IMMEDIATE MEDICAL CARE IF:   Your  pain does not go away or gets worse.  You have a fever.  Your pain is felt only in portions of the abdomen. The right side could possibly be appendicitis. The left lower portion of the abdomen could be colitis or diverticulitis.  You are passing blood in your stools (bright red or black tarry stools, with or without vomiting).  You have blood in your urine.  You develop chills, with or without a fever.  You pass out. MAKE SURE YOU:   Understand these instructions.  Will watch your condition.  Will get help right away if you are not doing well or get worse. Document Released: 02/01/2007 Document Revised: 06/29/2011 Document Reviewed: 02/21/2009 [facility] Patient Information 2014 [place], [place].  Chest Pain (Nonspecific) It is often hard to give a specific diagnosis for the cause of chest pain. There is always a chance that your pain could be related to something serious, such as a heart attack or a blood clot in the lungs. You need to follow up with your caregiver for further evaluation. CAUSES   Heartburn.  Pneumonia or bronchitis.  Anxiety or stress.  Inflammation around your heart (pericarditis) or lung (pleuritis or pleurisy).  A blood clot in the lung.  A collapsed lung (pneumothorax). It can develop suddenly on its own (spontaneous pneumothorax) or from injury (trauma) to the chest.  Shingles infection (herpes zoster virus). The chest wall is composed of bones, muscles, and cartilage. Any of these can be the source of the pain.  The bones can be bruised by injury.  The muscles or cartilage can be strained by coughing or overwork.  The cartilage can be affected by inflammation and become sore (costochondritis). DIAGNOSIS  Lab tests or other studies, such as X-rays, electrocardiography, stress testing, or cardiac imaging, may be needed to find the cause of your pain.  TREATMENT   Treatment depends on what may be causing your chest pain. Treatment may  include:  Acid blockers for heartburn.  Anti-inflammatory medicine.  Pain medicine for inflammatory conditions.  Antibiotics if an infection is present.  You may be advised to change lifestyle habits. This includes stopping smoking and avoiding alcohol, caffeine, and chocolate.  You may be advised to keep your head raised (elevated) when sleeping. This reduces the chance of acid going backward from your stomach into your esophagus.  Most of the time, nonspecific chest pain will improve within 2 to 3 days with rest and mild pain medicine. HOME CARE INSTRUCTIONS   If antibiotics were prescribed, take your antibiotics as directed. Finish them even if you start to feel better.  For the next few days, avoid physical activities that bring on chest pain. Continue physical activities as directed.  Do not smoke.  Avoid drinking alcohol.  Only take over-the-counter or prescription medicine for pain, discomfort, or fever as directed by your caregiver.  Follow your caregiver's suggestions for further testing if your chest pain does not go away.  Keep any follow-up appointments you made. If you do not go to an appointment, you could develop lasting (chronic) problems with pain. If there is any problem keeping an appointment, you must call to reschedule. SEEK MEDICAL CARE IF:   You think you are having problems from the medicine you are taking. Read your medicine instructions carefully.  Your chest pain  does not go away, even after treatment.  You develop a rash with blisters on your chest. SEEK IMMEDIATE MEDICAL CARE IF:   You have increased chest pain or pain that spreads to your arm, neck, jaw, back, or abdomen.  You develop shortness of breath, an increasing cough, or you are coughing up blood.  You have severe back or abdominal pain, feel nauseous, or vomit.  You develop severe weakness, fainting, or chills.  You have a fever. THIS IS AN EMERGENCY. Do not wait to see if the  pain will go away. Get medical help at once. Call your local emergency services (911 in [place] Do not drive yourself to the hospital. MAKE SURE YOU:   Understand these instructions.  Will watch your condition.  Will get help right away if you are not doing well or get worse. Document Released: 01/14/2005 Document Revised: 06/29/2011 Document Reviewed: 11/10/2007 [facility] Patient Information 2014 [place]

## 2014-01-15 ENCOUNTER — Emergency Department (INDEPENDENT_AMBULATORY_CARE_PROVIDER_SITE_OTHER)
Admission: EM | Admit: 2014-01-15 | Discharge: 2014-01-15 | Disposition: A | Discharge status: Home / Self Care | Payer: PRIVATE HEALTH INSURANCE | Source: Home / Self Care | Attending: Family Medicine | Admitting: Family Medicine

## 2014-01-15 ENCOUNTER — Encounter (HOSPITAL_COMMUNITY): Payer: Self-pay | Admitting: Emergency Medicine

## 2014-01-15 DIAGNOSIS — G4733 Obstructive sleep apnea (adult) (pediatric): Secondary | ICD-10-CM

## 2014-01-15 DIAGNOSIS — G4730 Sleep apnea, unspecified: Secondary | ICD-10-CM

## 2014-01-15 DIAGNOSIS — J309 Allergic rhinitis, unspecified: Secondary | ICD-10-CM

## 2014-01-15 DIAGNOSIS — J302 Other seasonal allergic rhinitis: Secondary | ICD-10-CM

## 2014-01-15 MED ORDER — TRIAMCINOLONE ACETONIDE 40 MG/ML IJ SUSP
INTRAMUSCULAR | Status: AC
  Filled 2014-01-15: qty 1

## 2014-01-15 MED ORDER — TRIAMCINOLONE ACETONIDE 40 MG/ML IJ SUSP
40.0000 mg | Freq: Once | INTRAMUSCULAR | Status: AC
  Administered 2014-01-15: 40 mg via INTRAMUSCULAR

## 2014-01-15 MED ORDER — IPRATROPIUM BROMIDE 0.06 % NA SOLN: 2.0000 | Freq: Four times a day (QID) | NASAL | Status: DC

## 2014-01-15 MED ORDER — METHYLPREDNISOLONE ACETATE 40 MG/ML IJ SUSP
80.0000 mg | Freq: Once | INTRAMUSCULAR | Status: AC
  Administered 2014-01-15: 80 mg via INTRAMUSCULAR

## 2014-01-15 MED ORDER — METHYLPREDNISOLONE ACETATE 80 MG/ML IJ SUSP
INTRAMUSCULAR | Status: AC
  Filled 2014-01-15: qty 1

## 2014-01-15 NOTE — ED Provider Notes (Signed)
CSN: [id number]     Arrival date & time 01/15/14  1804 History   First MD Initiated Contact with Patient 01/15/14 1822     Chief Complaint  Patient presents with  . Nasal Congestion   (Consider location/radiation/quality/duration/timing/severity/associated sxs/prior Treatment) Patient is a 43 y.o. female presenting with URI. The history is provided by the patient.  URI Presenting symptoms: congestion, cough, ear pain, rhinorrhea and sore throat   Presenting symptoms: no fever   Severity:  Mild Onset quality:  Gradual Duration:  2 weeks Progression:  Unchanged Chronicity:  New Associated symptoms: no wheezing   Risk factors comment:  Smoker, seasonal allergies -using zyrtec and nasocort.   Past Medical History  Diagnosis Date  . Sickle cell trait   . Interstitial cystitis   . Vitamin D insufficiency   . Headache(784.0)   . Endometriosis   . Heart murmur   . Asthma    Past Surgical History  Procedure Laterality Date  . Abdominal hysterectomy    . Eye surgery    . Ankle surgery    . Cystectomy    . Bartholin gland cyst excision    . Inguinal hernia repair    . Dilation and curettage of uterus    . Laparoscopy     History reviewed. No pertinent family history. History  Substance Use Topics  . Smoking status: Current Some Day Smoker -- 0.25 packs/day  . Smokeless tobacco: Not on file  . Alcohol Use: No   OB History   Grav Para Term Preterm Abortions TAB SAB Ect Mult Living   Review of Systems  Constitutional: Negative.  Negative for fever.  HENT: Positive for congestion, ear pain, rhinorrhea and sore throat.   Respiratory: Positive for cough. Negative for shortness of breath and wheezing.   Cardiovascular: Negative.   Gastrointestinal: Negative.     Allergies  Avelox; Amoxicillin-pot clavulanate; Aspirin; Erythromycin; Gabapentin; Lisinopril; Oxycodone; Sulfa antibiotics; and Tessalon  Home Medications   Prior to Admission medications    Medication Sig Start Date End Date Taking? Authorizing Provider  albuterol (PROVENTIL HFA;VENTOLIN HFA) 108 (90 BASE) MCG/ACT inhaler Inhale 2 puffs into the lungs every 6 (six) hours as needed for wheezing or shortness of breath.     Historical Provider, MD  beclomethasone (QVAR) 40 MCG/ACT inhaler Inhale 2 puffs into the lungs 3 (three) times a week.    Historical Provider, MD  BEE POLLEN PO Take 1 capsule by mouth daily.    Historical Provider, MD  cetirizine (ZYRTEC) 10 MG tablet Take 10 mg by mouth daily as needed. For allergies    Historical Provider, MD  [facility] Liver Oil CAPS Take 1 capsule by mouth daily.    Historical Provider, MD  CRANBERRY PO Take 1 capsule by mouth daily.    Historical Provider, MD  GARCINIA CAMBOGIA-CHROMIUM PO Take 1 tablet by mouth daily.    Historical Provider, MD  ibuprofen (ADVIL,MOTRIN) 200 MG tablet Take 400 mg by mouth every 8 (eight) hours as needed for mild pain.    Historical Provider, MD  ipratropium (ATROVENT) 0.06 % nasal spray Place 2 sprays into both nostrils 4 (four) times daily. 01/15/14   Linna Hoff, MD  Multiple Vitamin (MULTIVITAMIN WITH MINERALS) TABS Take 1 tablet by mouth daily.    Historical Provider, MD  VITAMIN E PO Take 1 capsule by mouth every other day.    Historical Provider, MD   BP  148/98  Pulse 76  Temp(Src) 98.3 F (36.8 C) (Oral)  Resp 16  SpO2 99% Physical Exam  Nursing note and vitals reviewed. Constitutional: She is oriented to person, place, and time. She appears well-developed and well-nourished. No distress.  HENT:  Head: Normocephalic.  Right Ear: External ear normal.  Left Ear: External ear normal.  Nose: Mucosal edema and rhinorrhea present.  Mouth/Throat: Oropharynx is clear and moist.  Neck: Normal range of motion. Neck supple.  Cardiovascular: Normal heart sounds.   Pulmonary/Chest: Effort normal and breath sounds normal.  Lymphadenopathy:    She has no cervical adenopathy.  Neurological: She is alert and  oriented to person, place, and time.  Skin: Skin is warm and dry.    ED Course  Procedures (including critical care time) Labs Review Labs Reviewed - No data to display  Imaging Review No results found.   MDM   1. Seasonal allergic reaction   2. Sleep apnea in adult        Linna Hoff, MD 01/15/14 [id number]

## 2014-01-15 NOTE — ED Notes (Signed)
C/o 2 week duration of URI type syx, ST, pressure in ears

## 2014-02-19 ENCOUNTER — Encounter (HOSPITAL_COMMUNITY): Payer: Self-pay | Admitting: Emergency Medicine

## 2014-04-20 ENCOUNTER — Encounter (HOSPITAL_COMMUNITY): Payer: Self-pay | Admitting: Emergency Medicine

## 2014-04-20 ENCOUNTER — Emergency Department (HOSPITAL_COMMUNITY)
Admission: EM | Admit: 2014-04-20 | Discharge: 2014-04-20 | Disposition: A | Discharge status: Home / Self Care | Payer: PRIVATE HEALTH INSURANCE | Attending: Emergency Medicine | Admitting: Emergency Medicine

## 2014-04-20 DIAGNOSIS — Z79899 Other long term (current) drug therapy: Secondary | ICD-10-CM | POA: Insufficient documentation

## 2014-04-20 DIAGNOSIS — Z720 Tobacco use: Secondary | ICD-10-CM | POA: Diagnosis not present

## 2014-04-20 DIAGNOSIS — J45909 Unspecified asthma, uncomplicated: Secondary | ICD-10-CM | POA: Insufficient documentation

## 2014-04-20 DIAGNOSIS — M79609 Pain in unspecified limb: Secondary | ICD-10-CM

## 2014-04-20 DIAGNOSIS — R011 Cardiac murmur, unspecified: Secondary | ICD-10-CM | POA: Insufficient documentation

## 2014-04-20 DIAGNOSIS — Z87448 Personal history of other diseases of urinary system: Secondary | ICD-10-CM | POA: Insufficient documentation

## 2014-04-20 DIAGNOSIS — E559 Vitamin D deficiency, unspecified: Secondary | ICD-10-CM | POA: Insufficient documentation

## 2014-04-20 DIAGNOSIS — Z862 Personal history of diseases of the blood and blood-forming organs and certain disorders involving the immune mechanism: Secondary | ICD-10-CM | POA: Insufficient documentation

## 2014-04-20 DIAGNOSIS — Z880 Allergy status to penicillin: Secondary | ICD-10-CM | POA: Diagnosis not present

## 2014-04-20 DIAGNOSIS — M79605 Pain in left leg: Secondary | ICD-10-CM | POA: Diagnosis not present

## 2014-04-20 MED ORDER — HYDROCODONE-ACETAMINOPHEN 5-325 MG PO TABS
2.0000 | ORAL_TABLET | Freq: Once | ORAL | Status: AC
Start: 2014-04-20 — End: 2014-04-20
  Administered 2014-04-20: 2 via ORAL
  Filled 2014-04-20: qty 2

## 2014-04-20 MED ORDER — HYDROCODONE-ACETAMINOPHEN 5-325 MG PO TABS: 1.0000 | ORAL_TABLET | ORAL | Status: DC | PRN

## 2014-04-20 NOTE — ED Provider Notes (Signed)
CSN: [id number]     Arrival date & time 04/20/14  1315 History   First MD Initiated Contact with Patient 04/20/14 1627     Chief Complaint  Patient presents with  . Leg Pain    left     (Consider location/radiation/quality/duration/timing/severity/associated sxs/prior Treatment) HPI Comments: The patient is a 44 year old female presenting from urgent care with a chief complaint of left posterior knee pain since last night with associated swelling. Urgent care and evaluated with questional Baker cyst rule out DVT. Patient reports bilateral leg swelling, to the proximal tibia, no distal pretibial edema. She reports no injury to the knee, she noticed swelling and pain after working last night. She reports associated left-sided calf tenderness.  No recent travel, family history or personal history of DVT/PE, smoking, cancer, or exogenous estrogen.   Patient is a 44 y.o. female presenting with leg pain. The history is provided by the patient. No language interpreter was used.  Leg Pain Associated symptoms: no fever     Past Medical History  Diagnosis Date  . Sickle cell trait   . Interstitial cystitis   . Vitamin D insufficiency   . Headache(784.0)   . Endometriosis   . Heart murmur   . Asthma    Past Surgical History  Procedure Laterality Date  . Abdominal hysterectomy    . Eye surgery    . Ankle surgery    . Cystectomy    . Bartholin gland cyst excision    . Inguinal hernia repair    . Dilation and curettage of uterus    . Laparoscopy     History reviewed. No pertinent family history. History  Substance Use Topics  . Smoking status: Current Some Day Smoker -- 0.25 packs/day  . Smokeless tobacco: Not on file  . Alcohol Use: No   OB History    Gravida Para Term Preterm AB TAB SAB Ectopic Multiple Living   Review of Systems  Constitutional: Negative for fever and chills.  Respiratory: Negative for shortness of breath.   Cardiovascular: Negative for  chest pain.  Musculoskeletal: Positive for joint swelling and arthralgias.  Skin: Negative for color change and wound.      Allergies  Avelox; Amoxicillin-pot clavulanate; Aspirin; Erythromycin; Gabapentin; Lisinopril; Oxycodone; Sulfa antibiotics; and Tessalon  Home Medications   Prior to Admission medications   Medication Sig Start Date End Date Taking? Authorizing Provider  BEE POLLEN PO Take 1 capsule by mouth daily.   Yes Historical Provider, MD  cetirizine (ZYRTEC) 10 MG tablet Take 10 mg by mouth daily as needed for allergies (allergies). For allergies   Yes Historical Provider, MD  [facility] Liver Oil CAPS Take 1 capsule by mouth daily.   Yes Historical Provider, MD  CRANBERRY PO Take 1 capsule by mouth daily.   Yes Historical Provider, MD  ibuprofen (ADVIL,MOTRIN) 200 MG tablet Take 200 mg by mouth every 8 (eight) hours as needed for mild pain or moderate pain (pain).    Yes Historical Provider, MD  Multiple Vitamin (MULTIVITAMIN WITH MINERALS) TABS Take 1 tablet by mouth daily.   Yes Historical Provider, MD  tiZANidine (ZANAFLEX) 4 MG tablet Take 2 mg by mouth every 6 (six) hours as needed for muscle spasms (muscle spasms).   Yes Historical Provider, MD  VITAMIN E PO Take 1 capsule by mouth every other day.   Yes Historical Provider, MD  albuterol (PROVENTIL HFA;VENTOLIN HFA) 108 (90  BASE) MCG/ACT inhaler Inhale 2 puffs into the lungs every 6 (six) hours as needed for wheezing or shortness of breath.     Historical Provider, MD  beclomethasone (QVAR) 40 MCG/ACT inhaler Inhale 2 puffs into the lungs 3 (three) times a week.    Historical Provider, MD  GARCINIA CAMBOGIA-CHROMIUM PO Take 1 tablet by mouth daily.    Historical Provider, MD  ipratropium (ATROVENT) 0.06 % nasal spray Place 2 sprays into both nostrils 4 (four) times daily. Patient taking differently: Place 2 sprays into both nostrils 2 (two) times daily as needed.  01/15/14   Linna Hoff, MD   BP 140/82 mmHg  Pulse 81   Temp(Src) 97.4 F (36.3 C) (Oral)  Resp 16  SpO2 100% Physical Exam  Constitutional: She is oriented to person, place, and time. She appears well-developed and well-nourished. No distress.  HENT:  Head: Normocephalic and atraumatic.  Neck: Neck supple.  Cardiovascular: Normal rate and regular rhythm.   Pulses:      Dorsalis pedis pulses are 2+ on the right side, and 2+ on the left side.  Left knee, mild swelling and tenderness with palpation.  Left calf tenderness with palpation. No pretibial edema.  Pulmonary/Chest: Effort normal. No respiratory distress.  Neurological: She is alert and oriented to person, place, and time.  Skin: Skin is warm and dry. She is not diaphoretic.  Psychiatric: She has a normal mood and affect. Her behavior is normal.  Nursing note and vitals reviewed.   ED Course  Procedures (including critical care time) Labs Review Labs Reviewed - No data to display  Imaging Review No results found. Left lower extremity venous duplex completed. Left: No evidence of DVT, superficial thrombosis, or Baker's cyst. Right: Negative for DVT in the common femoral vein.  EKG Interpretation None      MDM   Final diagnoses:  Left leg pain   Patient with likely Baker cyst, will order ultrasound to rule out DVT. Equal pulses, no lower extremity edema, left calf tenderness with palpation. SPO2 100% room air pulse 81. Patient in no acute distress. No risks factors for PE/DVT. Very low clinical suspicion for DVT. Plan to treat for pain awaiting ultrasound reports. Negative ultrasound. Plan to treat for muscle skeletal pain, Discussed imaging results, and treatment plan with the patient. Return precautions given. Reports understanding and no other concerns at this time.  Patient is stable for discharge at this time. Meds given in ED:  Medications  HYDROcodone-acetaminophen (NORCO/VICODIN) 5-325 MG per tablet 2 tablet (2 tablets Oral Given 04/20/14 1739)    Discharge  Medication List as of 04/20/2014  5:43 PM    START taking these medications   Details  HYDROcodone-acetaminophen (NORCO/VICODIN) 5-325 MG per tablet Take 1 tablet by mouth every 4 (four) hours as needed for moderate pain or severe pain., Starting 04/20/2014, Until Discontinued, Print             Mellody Drown, PA-C 04/21/14 1610  Suzi Roots, MD 04/21/14 [phone]

## 2014-04-20 NOTE — ED Notes (Signed)
Vascular present

## 2014-04-20 NOTE — Discharge Instructions (Signed)
Your knee pain is likely due to a musculoskeletal injury. The ultrasound did not show an evidence of a DVT or baker's cyst. Call an orthopedic specialist for further evaluation of your knee pain.  Call for a follow up appointment with a Family or Primary Care Provider.  Return if Symptoms worsen.   Take medication as prescribed.  Ice your knee 3-4 times a day.

## 2014-04-20 NOTE — ED Notes (Signed)
Patient was seen at urgent care today. Was told she had Baker's cysts behind both knees but was told the left leg could possibly have DVT. Was sent here to have ultrasound. No other issues noted. Not on blood thinners. Denies recent long trips. RR even/unlabored. No SOB noted. Hx asthma.

## 2014-04-20 NOTE — Progress Notes (Signed)
Left lower extremity venous duplex completed.  Left:  No evidence of DVT, superficial thrombosis, or Baker's cyst.  Right:  Negative for DVT in the common femoral vein.  

## 2014-06-27 ENCOUNTER — Emergency Department (HOSPITAL_COMMUNITY): Payer: PRIVATE HEALTH INSURANCE

## 2014-06-27 ENCOUNTER — Emergency Department (HOSPITAL_COMMUNITY)
Admission: EM | Admit: 2014-06-27 | Discharge: 2014-06-27 | Disposition: A | Discharge status: Home / Self Care | Payer: PRIVATE HEALTH INSURANCE | Attending: Emergency Medicine | Admitting: Emergency Medicine

## 2014-06-27 ENCOUNTER — Encounter (HOSPITAL_COMMUNITY): Payer: Self-pay | Admitting: Emergency Medicine

## 2014-06-27 DIAGNOSIS — Z720 Tobacco use: Secondary | ICD-10-CM | POA: Insufficient documentation

## 2014-06-27 DIAGNOSIS — R011 Cardiac murmur, unspecified: Secondary | ICD-10-CM | POA: Diagnosis not present

## 2014-06-27 DIAGNOSIS — Z7951 Long term (current) use of inhaled steroids: Secondary | ICD-10-CM | POA: Diagnosis not present

## 2014-06-27 DIAGNOSIS — R200 Anesthesia of skin: Secondary | ICD-10-CM | POA: Insufficient documentation

## 2014-06-27 DIAGNOSIS — J45909 Unspecified asthma, uncomplicated: Secondary | ICD-10-CM | POA: Insufficient documentation

## 2014-06-27 DIAGNOSIS — R519 Headache, unspecified: Secondary | ICD-10-CM

## 2014-06-27 DIAGNOSIS — Z87448 Personal history of other diseases of urinary system: Secondary | ICD-10-CM | POA: Diagnosis not present

## 2014-06-27 DIAGNOSIS — R51 Headache: Secondary | ICD-10-CM | POA: Insufficient documentation

## 2014-06-27 DIAGNOSIS — Z862 Personal history of diseases of the blood and blood-forming organs and certain disorders involving the immune mechanism: Secondary | ICD-10-CM | POA: Insufficient documentation

## 2014-06-27 DIAGNOSIS — Z880 Allergy status to penicillin: Secondary | ICD-10-CM | POA: Diagnosis not present

## 2014-06-27 DIAGNOSIS — Z79899 Other long term (current) drug therapy: Secondary | ICD-10-CM | POA: Diagnosis not present

## 2014-06-27 DIAGNOSIS — Z8639 Personal history of other endocrine, nutritional and metabolic disease: Secondary | ICD-10-CM | POA: Insufficient documentation

## 2014-06-27 DIAGNOSIS — R202 Paresthesia of skin: Secondary | ICD-10-CM

## 2014-06-27 LAB — COMPREHENSIVE METABOLIC PANEL
ALBUMIN: 3.9 g/dL (ref 3.5–5.2)
ALT: 15 U/L (ref 0–35)
AST: 23 U/L (ref 0–37)
Alkaline Phosphatase: 69 U/L (ref 39–117)
Anion gap: 7 (ref 5–15)
BUN: 10 mg/dL (ref 6–23)
CHLORIDE: 107 mmol/L (ref 96–112)
CO2: 27 mmol/L (ref 19–32)
CREATININE: 0.72 mg/dL (ref 0.50–1.10)
Calcium: 9.3 mg/dL (ref 8.4–10.5)
GFR calc Af Amer: >90 mL/min (ref >90)
Glucose, Bld: 104 mg/dL — ABNORMAL HIGH (ref 70–99)
POTASSIUM: 3.7 mmol/L (ref 3.5–5.1)
SODIUM: 141 mmol/L (ref 135–145)
Total Bilirubin: 0.6 mg/dL (ref 0.3–1.2)
Total Protein: 7 g/dL (ref 6.0–8.3)

## 2014-06-27 LAB — CBC
HCT: 40.2 % (ref 36.0–46.0)
HEMOGLOBIN: 14.1 g/dL (ref 12.0–15.0)
MCH: 30.1 pg (ref 26.0–34.0)
MCHC: 35.1 g/dL (ref 30.0–36.0)
MCV: 85.7 fL (ref 78.0–100.0)
PLATELETS: 293 10*3/uL (ref 150–400)
RBC: 4.69 MIL/uL (ref 3.87–5.11)
RDW: 13.9 % (ref 11.5–15.5)
WBC: 11.1 10*3/uL — ABNORMAL HIGH (ref 4.0–10.5)

## 2014-06-27 MED ORDER — METOCLOPRAMIDE HCL 5 MG/ML IJ SOLN
10.0000 mg | Freq: Once | INTRAMUSCULAR | Status: AC
  Administered 2014-06-27: 10 mg via INTRAMUSCULAR
  Filled 2014-06-27: qty 2

## 2014-06-27 NOTE — ED Notes (Signed)
Pt presents with a headache and numbness to the center of her head for the past week.  Pt also admits to a tingling sensation in extremities.  Pt alert and oriented X 4 at present, neuro exam normal.

## 2014-06-27 NOTE — ED Provider Notes (Signed)
CSN: [id number]     Arrival date & time 06/27/14  0005 History   First MD Initiated Contact with Patient 06/27/14 0201     Chief Complaint  Patient presents with  . Numbness    (Consider location/radiation/quality/duration/timing/severity/associated sxs/prior Treatment) HPI Comments: Patient is a 44 year old female with a hx of sickle cell trait, interstitial cystitis, headache, and HTN (per patient) who presents to the ED for further evaluation of multiple symptoms. Patient states that she has been experiencing a pain in her posterior parietal region over the past week which is constant. She states that she has experienced similar pain in the past year intermittently. She states that she thought her symptoms were secondary to high blood pressure because any other time she experienced similar pain and saw her primary doctor, her blood pressure was elevated. She has intermittently been experiencing paresthesias in her extremities. She states that paresthesias migrate between her upper and lower extremities. She has had paresthesias on both her left and right side, but never bilateral paresthesias simultaneously. She also notes paresthesias in her cheeks and sensitivity to smell. Patient takes ibuprofen daily and states this has not been improving her symptoms. She reports that her coworkers have been telling her she "tends to walk to the right rahter than striaight". She doesn't know if this may be related to her symptoms today. She denies fever, vision changes, vision loss, tinnitus, difficulty speaking or swallowing, photophobia or phonophobia, vomiting, diarrhea, extremity weakness, or syncope. No hx of head trauma.  The history is provided by the patient. No language interpreter was used.    Past Medical History  Diagnosis Date  . Sickle cell trait   . Interstitial cystitis   . Vitamin D insufficiency   . Headache(784.0)   . Endometriosis   . Heart murmur   . Asthma    Past Surgical History   Procedure Laterality Date  . Abdominal hysterectomy    . Eye surgery    . Ankle surgery    . Cystectomy    . Bartholin gland cyst excision    . Inguinal hernia repair    . Dilation and curettage of uterus    . Laparoscopy     No family history on file. History  Substance Use Topics  . Smoking status: Current Some Day Smoker -- 0.25 packs/day  . Smokeless tobacco: Not on file  . Alcohol Use: No   OB History    Gravida Para Term Preterm AB TAB SAB Ectopic Multiple Living   1 1        1       Review of Systems  Constitutional: Negative for fever.  Neurological: Positive for headaches. Negative for syncope, weakness and numbness.       +paresthesias  All other systems reviewed and are negative.   Allergies  Avelox; Amoxicillin-pot clavulanate; Aspirin; Erythromycin; Gabapentin; Lisinopril; Oxycodone; Sulfa antibiotics; and Tessalon  Home Medications   Prior to Admission medications   Medication Sig Start Date End Date Taking? Authorizing Provider  albuterol (PROVENTIL HFA;VENTOLIN HFA) 108 (90 BASE) MCG/ACT inhaler Inhale 2 puffs into the lungs every 6 (six) hours as needed for wheezing or shortness of breath.     Historical Provider, MD  beclomethasone (QVAR) 40 MCG/ACT inhaler Inhale 2 puffs into the lungs 3 (three) times a week.    Historical Provider, MD  BEE POLLEN PO Take 1 capsule by mouth daily.    Historical Provider, MD  cetirizine (ZYRTEC) 10 MG tablet Take 10 mg by mouth  daily as needed for allergies (allergies). For allergies    Historical Provider, MD  [facility] Liver Oil CAPS Take 1 capsule by mouth daily.    Historical Provider, MD  CRANBERRY PO Take 1 capsule by mouth daily.    Historical Provider, MD  GARCINIA CAMBOGIA-CHROMIUM PO Take 1 tablet by mouth daily.    Historical Provider, MD  HYDROcodone-acetaminophen (NORCO/VICODIN) 5-325 MG per tablet Take 1 tablet by mouth every 4 (four) hours as needed for moderate pain or severe pain. 04/20/14   Mellody Drown, PA-C   ibuprofen (ADVIL,MOTRIN) 200 MG tablet Take 200 mg by mouth every 8 (eight) hours as needed for mild pain or moderate pain (pain).     Historical Provider, MD  ipratropium (ATROVENT) 0.06 % nasal spray Place 2 sprays into both nostrils 4 (four) times daily. Patient taking differently: Place 2 sprays into both nostrils 2 (two) times daily as needed.  01/15/14   Linna Hoff, MD  Multiple Vitamin (MULTIVITAMIN WITH MINERALS) TABS Take 1 tablet by mouth daily.    Historical Provider, MD  tiZANidine (ZANAFLEX) 4 MG tablet Take 2 mg by mouth every 6 (six) hours as needed for muscle spasms (muscle spasms).    Historical Provider, MD  VITAMIN E PO Take 1 capsule by mouth every other day.    Historical Provider, MD   BP 121/82 mmHg  Pulse 60  Temp(Src) 97.6 F (36.4 C) (Oral)  Resp 16  SpO2 99%   Physical Exam  Constitutional: She is oriented to person, place, and time. She appears well-developed and well-nourished. No distress.  Nontoxic/nonseptic appearing  HENT:  Head: Normocephalic and atraumatic.  Mouth/Throat: Oropharynx is clear and moist. No oropharyngeal exudate.  No hemotympanum bilaterally.  Eyes: Conjunctivae and EOM are normal. Pupils are equal, round, and reactive to light. No scleral icterus.  Neck: Normal range of motion.  No nuchal rigidity or meningismus  Cardiovascular: Normal rate, regular rhythm and normal heart sounds.   Pulmonary/Chest: Effort normal and breath sounds normal. No respiratory distress. She has no wheezes. She has no rales.  Respirations even and unlabored  Musculoskeletal: Normal range of motion.  Neurological: She is alert and oriented to person, place, and time. No cranial nerve deficit. She exhibits normal muscle tone. Coordination normal.  GCS 15. Speech is goal oriented. Patient answers questions appropriately and follows simple commands. No cranial nerve deficits appreciated; symmetric eyebrow raise, no facial drooping, tongue midline. Patient has  equal grip strength and 5/5 strength against resistance in all major muscle groups bilaterally. Sensation to light touch intact. Patient moves extremities without ataxia. She ambulates with normal, steady gait.  Skin: Skin is warm and dry. No rash noted. She is not diaphoretic. No erythema. No pallor.  Psychiatric: She has a normal mood and affect. Her behavior is normal.  Nursing note and vitals reviewed.   ED Course  Procedures (including critical care time) Labs Review Labs Reviewed  CBC - Abnormal; Notable for the following:    WBC 11.1 (*)    All other components within normal limits  COMPREHENSIVE METABOLIC PANEL - Abnormal; Notable for the following:    Glucose, Bld 104 (*)    All other components within normal limits    Imaging Review Ct Head Wo Contrast  06/27/2014   CLINICAL DATA:  Headache and numbness for 1 week, vertex pain and generalized body numbness.  EXAM: CT HEAD WITHOUT CONTRAST  TECHNIQUE: Contiguous axial images were obtained from the base of the skull through the vertex  without intravenous contrast.  COMPARISON:  CT of the head January 07, 2010  FINDINGS: The ventricles and sulci are normal. No intraparenchymal hemorrhage, mass effect nor midline shift. No acute large vascular territory infarcts.  No abnormal extra-axial fluid collections. Basal cisterns are patent.  No skull fracture. The included ocular globes and orbital contents are non-suspicious. The mastoid aircells and included paranasal sinuses are well-aerated.  IMPRESSION: Normal noncontrast CT of the head.   Electronically Signed   By: Awilda Metro   On: 06/27/2014 03:17     EKG Interpretation None      MDM   Final diagnoses:  Scalp pain  Paresthesias    44 y/o nontoxic appearing female presenting to the ED for vague symptoms of scalp pain/headache with migratory paresthesias. Nonfocal neurologic exam today. Afebrile without nuchal rigidity or meningismus. CT head negative. Also no evidence  of mass/lesion on imaging. Patient denies hx of trauma/injury inciting symptoms. Patient normotensive over ED course. Symptoms improved with Reglan. Suspect complicated migraine. Will d/c and refer to neurology for further evaluation of symptoms. Return precautions given. Patient agreeable to plan with no unaddressed concerns. Patient discharged in good condition.   Filed Vitals:   06/27/14 0230 06/27/14 0315 06/27/14 0330 06/27/14 0452  BP: 136/78  121/81 122/69  Pulse: 57  73 60  Temp:  97.6 F (36.4 C)  97.6 F (36.4 C)  TempSrc:  Oral  Oral  Resp:  18  20  SpO2: 100%  98% 99%     Antony Madura, PA-C 07/01/14 0805  Devoria Albe, MD 07/02/14 [phone]

## 2014-06-27 NOTE — Discharge Instructions (Signed)
The CT of your head was negative and your blood work was normal. Follow-up with a neurologist for further evaluation of your symptoms. Also recommend that you follow-up with your primary care doctor for further evaluation of your symptoms and recheck. Return to the emergency department as needed if symptoms worsen.  Paresthesia Paresthesia is an abnormal burning or prickling sensation. This sensation is generally felt in the hands, arms, legs, or feet. However, it may occur in any part of the body. It is usually not painful. The feeling may be described as:  Tingling or numbness.  "Pins and needles."  Skin crawling.  Buzzing.  Limbs "falling asleep."  Itching. Most people experience temporary (transient) paresthesia at some time in their lives. CAUSES  Paresthesia may occur when you breathe too quickly (hyperventilation). It can also occur without any apparent cause. Commonly, paresthesia occurs when pressure is placed on a nerve. The feeling quickly goes away once the pressure is removed. For some people, however, paresthesia is a long-lasting (chronic) condition caused by an underlying disorder. The underlying disorder may be:  A traumatic, direct injury to nerves. Examples include a:  Broken (fractured) neck.  Fractured skull.  A disorder affecting the brain and spinal cord (central nervous system). Examples include:  Transverse myelitis.  Encephalitis.  Transient ischemic attack.  Multiple sclerosis.  Stroke.  Tumor or blood vessel problems, such as an arteriovenous malformation pressing against the brain or spinal cord.  A condition that damages the peripheral nerves (peripheral neuropathy). Peripheral nerves are not part of the brain and spinal cord. These conditions include:  Diabetes.  Peripheral vascular disease.  Nerve entrapment syndromes, such as carpal tunnel syndrome.  Shingles.  Hypothyroidism.  Vitamin B12 deficiencies.  Alcoholism.  Heavy metal  poisoning (lead, arsenic).  Rheumatoid arthritis.  Systemic lupus erythematosus. DIAGNOSIS  Your caregiver will attempt to find the underlying cause of your paresthesia. Your caregiver may:  Take your medical history.  Perform a physical exam.  Order various lab tests.  Order imaging tests. TREATMENT  Treatment for paresthesia depends on the underlying cause. HOME CARE INSTRUCTIONS  Avoid drinking alcohol.  You may consider massage or acupuncture to help relieve your symptoms.  Keep all follow-up appointments as directed by your caregiver. SEEK IMMEDIATE MEDICAL CARE IF:   You feel weak.  You have trouble walking or moving.  You have problems with speech or vision.  You feel confused.  You cannot control your bladder or bowel movements.  You feel numbness after an injury.  You faint.  Your burning or prickling feeling gets worse when walking.  You have pain, cramps, or dizziness.  You develop a rash. MAKE SURE YOU:  Understand these instructions.  Will watch your condition.  Will get help right away if you are not doing well or get worse. Document Released: 03/27/2002 Document Revised: 06/29/2011 Document Reviewed: 12/26/2010 [facility] Patient Information 2015 [place], [place]. This information is not intended to replace advice given to you by your health care provider. Make sure you discuss any questions you have with your health care provider.

## 2014-06-27 NOTE — ED Notes (Signed)
Patient transported to CT 

## 2014-12-04 ENCOUNTER — Emergency Department (HOSPITAL_COMMUNITY)
Admission: EM | Admit: 2014-12-04 | Discharge: 2014-12-04 | Disposition: A | Discharge status: Home / Self Care | Payer: PRIVATE HEALTH INSURANCE | Attending: Emergency Medicine | Admitting: Emergency Medicine

## 2014-12-04 ENCOUNTER — Emergency Department (HOSPITAL_COMMUNITY): Payer: PRIVATE HEALTH INSURANCE

## 2014-12-04 ENCOUNTER — Encounter (HOSPITAL_COMMUNITY): Payer: Self-pay | Admitting: *Deleted

## 2014-12-04 DIAGNOSIS — J45909 Unspecified asthma, uncomplicated: Secondary | ICD-10-CM | POA: Diagnosis not present

## 2014-12-04 DIAGNOSIS — Z79899 Other long term (current) drug therapy: Secondary | ICD-10-CM | POA: Insufficient documentation

## 2014-12-04 DIAGNOSIS — Z7951 Long term (current) use of inhaled steroids: Secondary | ICD-10-CM | POA: Insufficient documentation

## 2014-12-04 DIAGNOSIS — R011 Cardiac murmur, unspecified: Secondary | ICD-10-CM | POA: Insufficient documentation

## 2014-12-04 DIAGNOSIS — Z720 Tobacco use: Secondary | ICD-10-CM | POA: Insufficient documentation

## 2014-12-04 DIAGNOSIS — Z862 Personal history of diseases of the blood and blood-forming organs and certain disorders involving the immune mechanism: Secondary | ICD-10-CM | POA: Insufficient documentation

## 2014-12-04 DIAGNOSIS — I10 Essential (primary) hypertension: Secondary | ICD-10-CM | POA: Insufficient documentation

## 2014-12-04 DIAGNOSIS — Z8639 Personal history of other endocrine, nutritional and metabolic disease: Secondary | ICD-10-CM | POA: Diagnosis not present

## 2014-12-04 DIAGNOSIS — Z880 Allergy status to penicillin: Secondary | ICD-10-CM | POA: Diagnosis not present

## 2014-12-04 DIAGNOSIS — R079 Chest pain, unspecified: Secondary | ICD-10-CM | POA: Diagnosis not present

## 2014-12-04 DIAGNOSIS — Z8742 Personal history of other diseases of the female genital tract: Secondary | ICD-10-CM | POA: Insufficient documentation

## 2014-12-04 LAB — BASIC METABOLIC PANEL
Anion gap: 11 (ref 5–15)
BUN: 13 mg/dL (ref 6–20)
CHLORIDE: 104 mmol/L (ref 101–111)
CO2: 22 mmol/L (ref 22–32)
CREATININE: 0.77 mg/dL (ref 0.44–1.00)
Calcium: 9.1 mg/dL (ref 8.9–10.3)
GFR calc Af Amer: >60 mL/min (ref >60)
GFR calc non Af Amer: >60 mL/min (ref >60)
Glucose, Bld: 92 mg/dL (ref 65–99)
Potassium: 3.8 mmol/L (ref 3.5–5.1)
SODIUM: 137 mmol/L (ref 135–145)

## 2014-12-04 LAB — CBC
HCT: 37.3 % (ref 36.0–46.0)
HEMOGLOBIN: 12.9 g/dL (ref 12.0–15.0)
MCH: 29 pg (ref 26.0–34.0)
MCHC: 34.6 g/dL (ref 30.0–36.0)
MCV: 83.8 fL (ref 78.0–100.0)
PLATELETS: 261 10*3/uL (ref 150–400)
RBC: 4.45 MIL/uL (ref 3.87–5.11)
RDW: 13.8 % (ref 11.5–15.5)
WBC: 8.9 10*3/uL (ref 4.0–10.5)

## 2014-12-04 LAB — I-STAT TROPONIN, ED
TROPONIN I, POC: 0 ng/mL (ref 0.00–0.08)
TROPONIN I, POC: 0.01 ng/mL (ref 0.00–0.08)

## 2014-12-04 MED ORDER — GI COCKTAIL ~~LOC~~
30.0000 mL | Freq: Once | ORAL | Status: AC
  Administered 2014-12-04: 30 mL via ORAL
  Filled 2014-12-04: qty 30

## 2014-12-04 NOTE — ED Notes (Signed)
Pt c/o chest pain and HTN today.

## 2014-12-04 NOTE — ED Provider Notes (Signed)
CSN: [id number]     Arrival date & time 12/04/14  1830 History   First MD Initiated Contact with Patient 12/04/14 2131     Chief Complaint  Patient presents with  . Chest Pain  . Hypertension     (Consider location/radiation/quality/duration/timing/severity/associated sxs/prior Treatment) Patient is a 44 y.o. female presenting with chest pain.  Chest Pain Pain location:  L chest Pain quality: sharp and stabbing   Pain radiates to:  Does not radiate Pain radiates to the back: no   Pain severity:  Mild Onset quality:  Gradual Duration:  5 hours Timing:  Intermittent Progression:  Waxing and waning Chronicity:  New Context comment:  Noncompliant with antihypertensives, hypertensive to 200 systolic Relieved by:  Nothing Worsened by:  Nothing tried Associated symptoms: no abdominal pain and no fever     Past Medical History  Diagnosis Date  . Sickle cell trait   . Interstitial cystitis   . Vitamin D insufficiency   . Headache(784.0)   . Endometriosis   . Heart murmur   . Asthma    Past Surgical History  Procedure Laterality Date  . Abdominal hysterectomy    . Eye surgery    . Ankle surgery    . Cystectomy    . Bartholin gland cyst excision    . Inguinal hernia repair    . Dilation and curettage of uterus    . Laparoscopy     No family history on file. Social History  Substance Use Topics  . Smoking status: Current Some Day Smoker -- 0.25 packs/day  . Smokeless tobacco: None  . Alcohol Use: No   OB History    Gravida Para Term Preterm AB TAB SAB Ectopic Multiple Living   1 1        1      Review of Systems  Constitutional: Negative for fever.  Cardiovascular: Positive for chest pain.  Gastrointestinal: Negative for abdominal pain.  All other systems reviewed and are negative.     Allergies  Avelox; Amoxicillin-pot clavulanate; Aspirin; Erythromycin; Gabapentin; Lisinopril; Oxycodone; Sulfa antibiotics; and Tessalon  Home Medications   Prior to  Admission medications   Medication Sig Start Date End Date Taking? Authorizing Provider  albuterol (PROVENTIL HFA;VENTOLIN HFA) 108 (90 BASE) MCG/ACT inhaler Inhale 2 puffs into the lungs every 6 (six) hours as needed for wheezing or shortness of breath.    Yes Historical Provider, MD  beclomethasone (QVAR) 40 MCG/ACT inhaler Inhale 2 puffs into the lungs 3 (three) times a week.   Yes Historical Provider, MD  BEE POLLEN PO Take 1 capsule by mouth daily.   Yes Historical Provider, MD  cetirizine (ZYRTEC) 10 MG tablet Take 10 mg by mouth daily as needed for allergies (allergies). For allergies   Yes Historical Provider, MD  [facility] Liver Oil CAPS Take 1 capsule by mouth daily.   Yes Historical Provider, MD  CRANBERRY PO Take 1 capsule by mouth daily.   Yes Historical Provider, MD  GARCINIA CAMBOGIA-CHROMIUM PO Take 1 tablet by mouth daily.   Yes Historical Provider, MD  ibuprofen (ADVIL,MOTRIN) 200 MG tablet Take 200 mg by mouth every 8 (eight) hours as needed for mild pain or moderate pain (pain).    Yes Historical Provider, MD  Multiple Vitamin (MULTIVITAMIN WITH MINERALS) TABS Take 1 tablet by mouth daily.   Yes Historical Provider, MD  tiZANidine (ZANAFLEX) 4 MG tablet Take 2 mg by mouth every 6 (six) hours as needed for muscle spasms (muscle spasms).   Yes  Historical Provider, MD  VITAMIN E PO Take 1 capsule by mouth every other day.   Yes Historical Provider, MD  ipratropium (ATROVENT) 0.06 % nasal spray Place 2 sprays into both nostrils 4 (four) times daily. Patient taking differently: Place 2 sprays into both nostrils 2 (two) times daily as needed.  01/15/14   Linna Hoff, MD   BP 142/85 mmHg  Pulse 57  Temp(Src) 98.3 F (36.8 C) (Oral)  Resp 12  Ht  (1.676 m)  Wt 175 lb (79.379 kg)  BMI 28.26 kg/m2  SpO2 100% Physical Exam  Constitutional: She is oriented to person, place, and time. She appears well-developed and well-nourished.  HENT:  Head: Normocephalic and atraumatic.  Right  Ear: External ear normal.  Left Ear: External ear normal.  Eyes: Conjunctivae and EOM are normal. Pupils are equal, round, and reactive to light.  Neck: Normal range of motion. Neck supple.  Cardiovascular: Normal rate, regular rhythm, normal heart sounds and intact distal pulses.   Pulmonary/Chest: Effort normal and breath sounds normal.  Abdominal: Soft. Bowel sounds are normal. There is no tenderness.  Musculoskeletal: Normal range of motion.  Neurological: She is alert and oriented to person, place, and time.  Skin: Skin is warm and dry.  Vitals reviewed.   ED Course  Procedures (including critical care time) Labs Review Labs Reviewed  BASIC METABOLIC PANEL  CBC  I-STAT TROPOININ, ED  Rosezena Sensor, ED    Imaging Review Dg Chest 2 View  12/04/2014   CLINICAL DATA:  Acute onset of chest pain earlier today. Patient hypertensive in the emergency department. Current history of sickle cell trait.  EXAM: CHEST  2 VIEW  COMPARISON:  07/12/2013 and earlier.  FINDINGS: Cardiomediastinal silhouette unremarkable, unchanged. Lungs clear. Bronchovascular markings normal. Pulmonary vascularity normal. No visible pleural effusions. No pneumothorax. Thoracolumbar scoliosis convex left. No significant interval change.  IMPRESSION: No acute cardiopulmonary disease.  Stable examination.   Electronically Signed   By: Hulan Saas M.D.   On: 12/04/2014 19:21   I have personally reviewed and evaluated these images and lab results as part of my medical decision-making.   EKG Interpretation   Date/Time:  Tuesday December 04 2014 18:42:16 EDT Ventricular Rate:  75 PR Interval:  160 QRS Duration: 74 QT Interval:  396 QTC Calculation: 442 R Axis:   64 Text Interpretation:  Normal sinus rhythm Normal ECG No significant change  since last tracing Confirmed by Mirian Mo [id number]) on 12/04/2014  9:32:48 PM      MDM   Final diagnoses:  Chest pain, unspecified chest pain type    44 y.o.  female with pertinent PMH of HTN presents with chest pain and malaise as above. Patient was concerned as her blood pressure was over 200 systolic on one measurement. She had not taken her blood pressure medicine this morning. She immediately took it and went to sleep. When she woke her pain was present so she presented for evaluation. On arrival today the patient has vital signs and physical exam as above, mildly hypertensive. Workup obtained as above and unremarkable including delta troponin. Symptoms appear musculoskeletal in nature, patient has chest wall tenderness and reproduction of symptoms with palpation of her chest wall. No shortness of breath or other risk factors for PE. Discharged home in stable condition..    I have reviewed all laboratory and imaging studies if ordered as above  1. Chest pain, unspecified chest pain type         Molli Hazard  Littie Deeds, MD 12/04/14 2325

## 2014-12-04 NOTE — Discharge Instructions (Signed)
Chest Pain (Nonspecific) °It is often hard to give a specific diagnosis for the cause of chest pain. There is always a chance that your pain could be related to something serious, such as a heart attack or a blood clot in the lungs. You need to follow up with your health care provider for further evaluation. °CAUSES  °· Heartburn. °· Pneumonia or bronchitis. °· Anxiety or stress. °· Inflammation around your heart (pericarditis) or lung (pleuritis or pleurisy). °· A blood clot in the lung. °· A collapsed lung (pneumothorax). It can develop suddenly on its own (spontaneous pneumothorax) or from trauma to the chest. °· Shingles infection (herpes zoster virus). °The chest wall is composed of bones, muscles, and cartilage. Any of these can be the source of the pain. °· The bones can be bruised by injury. °· The muscles or cartilage can be strained by coughing or overwork. °· The cartilage can be affected by inflammation and become sore (costochondritis). °DIAGNOSIS  °Lab tests or other studies may be needed to find the cause of your pain. Your health care provider may have you take a test called an ambulatory electrocardiogram (ECG). An ECG records your heartbeat patterns over a 24-hour period. You may also have other tests, such as: °· Transthoracic echocardiogram (TTE). During echocardiography, sound waves are used to evaluate how blood flows through your heart. °· Transesophageal echocardiogram (TEE). °· Cardiac monitoring. This allows your health care provider to monitor your heart rate and rhythm in real time. °· Holter monitor. This is a portable device that records your heartbeat and can help diagnose heart arrhythmias. It allows your health care provider to track your heart activity for several days, if needed. °· Stress tests by exercise or by giving medicine that makes the heart beat faster. °TREATMENT  °· Treatment depends on what may be causing your chest pain. Treatment may include: °· Acid blockers for  heartburn. °· Anti-inflammatory medicine. °· Pain medicine for inflammatory conditions. °· Antibiotics if an infection is present. °· You may be advised to change lifestyle habits. This includes stopping smoking and avoiding alcohol, caffeine, and chocolate. °· You may be advised to keep your head raised (elevated) when sleeping. This reduces the chance of acid going backward from your stomach into your esophagus. °Most of the time, nonspecific chest pain will improve within 2-3 days with rest and mild pain medicine.  °HOME CARE INSTRUCTIONS  °· If antibiotics were prescribed, take them as directed. Finish them even if you start to feel better. °· For the next few days, avoid physical activities that bring on chest pain. Continue physical activities as directed. °· Do not use any tobacco products, including cigarettes, chewing tobacco, or electronic cigarettes. °· Avoid drinking alcohol. °· Only take medicine as directed by your health care provider. °· Follow your health care provider's suggestions for further testing if your chest pain does not go away. °· Keep any follow-up appointments you made. If you do not go to an appointment, you could develop lasting (chronic) problems with pain. If there is any problem keeping an appointment, call to reschedule. °SEEK MEDICAL CARE IF:  °· Your chest pain does not go away, even after treatment. °· You have a rash with blisters on your chest. °· You have a fever. °SEEK IMMEDIATE MEDICAL CARE IF:  °· You have increased chest pain or pain that spreads to your arm, neck, jaw, back, or abdomen. °· You have shortness of breath. °· You have an increasing cough, or you cough   up blood. °· You have severe back or abdominal pain. °· You feel nauseous or vomit. °· You have severe weakness. °· You faint. °· You have chills. °This is an emergency. Do not wait to see if the pain will go away. Get medical help at once. Call your local emergency services (911 in [place] Do not drive  yourself to the hospital. °MAKE SURE YOU:  °· Understand these instructions. °· Will watch your condition. °· Will get help right away if you are not doing well or get worse. °Document Released: 01/14/2005 Document Revised: 04/11/2013 Document Reviewed: 11/10/2007 °ExitCare® Patient Information ©2015 [employer]. This information is not intended to replace advice given to you by your health care provider. Make sure you discuss any questions you have with your health care provider. ° °Hypertension °Hypertension, commonly called high blood pressure, is when the force of blood pumping through your arteries is too strong. Your arteries are the blood vessels that carry blood from your heart throughout your body. A blood pressure reading consists of a higher number over a lower number, such as 110/72. The higher number (systolic) is the pressure inside your arteries when your heart pumps. The lower number (diastolic) is the pressure inside your arteries when your heart relaxes. Ideally you want your blood pressure below 120/80. °Hypertension forces your heart to work harder to pump blood. Your arteries may become narrow or stiff. Having hypertension puts you at risk for heart disease, stroke, and other problems.  °RISK FACTORS °Some risk factors for high blood pressure are controllable. Others are not.  °Risk factors you cannot control include:  °· Race. You may be at higher risk if you are African American. °· Age. Risk increases with age. °· Gender. Men are at higher risk than women before age 45 years. After age 65, women are at higher risk than men. °Risk factors you can control include: °· Not getting enough exercise or physical activity. °· Being overweight. °· Getting too much fat, sugar, calories, or salt in your diet. °· Drinking too much alcohol. °SIGNS AND SYMPTOMS °Hypertension does not usually cause signs or symptoms. Extremely high blood pressure (hypertensive crisis) may cause headache, anxiety, shortness  of breath, and nosebleed. °DIAGNOSIS  °To check if you have hypertension, your health care provider will measure your blood pressure while you are seated, with your arm held at the level of your heart. It should be measured at least twice using the same arm. Certain conditions can cause a difference in blood pressure between your right and left arms. A blood pressure reading that is higher than normal on one occasion does not mean that you need treatment. If one blood pressure reading is high, ask your health care provider about having it checked again. °TREATMENT  °Treating high blood pressure includes making lifestyle changes and possibly taking medicine. Living a healthy lifestyle can help lower high blood pressure. You may need to change some of your habits. °Lifestyle changes may include: °· Following the DASH diet. This diet is high in fruits, vegetables, and whole grains. It is low in salt, red meat, and added sugars. °· Getting at least 2½ hours of brisk physical activity every week. °· Losing weight if necessary. °· Not smoking. °· Limiting alcoholic beverages. °· Learning ways to reduce stress. ° If lifestyle changes are not enough to get your blood pressure under control, your health care provider may prescribe medicine. You may need to take more than one. Work closely with your health care provider   to understand the risks and benefits. °HOME CARE INSTRUCTIONS °· Have your blood pressure rechecked as directed by your health care provider.   °· Take medicines only as directed by your health care provider. Follow the directions carefully. Blood pressure medicines must be taken as prescribed. The medicine does not work as well when you skip doses. Skipping doses also puts you at risk for problems.   °· Do not smoke.   °· Monitor your blood pressure at home as directed by your health care provider.  °SEEK MEDICAL CARE IF:  °· You think you are having a reaction to medicines taken. °· You have recurrent  headaches or feel dizzy. °· You have swelling in your ankles. °· You have trouble with your vision. °SEEK IMMEDIATE MEDICAL CARE IF: °· You develop a severe headache or confusion. °· You have unusual weakness, numbness, or feel faint. °· You have severe chest or abdominal pain. °· You vomit repeatedly. °· You have trouble breathing. °MAKE SURE YOU:  °· Understand these instructions. °· Will watch your condition. °· Will get help right away if you are not doing well or get worse. °Document Released: 04/06/2005 Document Revised: 08/21/2013 Document Reviewed: 01/27/2013 °ExitCare® Patient Information ©2015 [employer]. This information is not intended to replace advice given to you by your health care provider. Make sure you discuss any questions you have with your health care provider. ° °

## 2014-12-20 ENCOUNTER — Encounter (HOSPITAL_COMMUNITY): Payer: Self-pay

## 2014-12-20 ENCOUNTER — Emergency Department (HOSPITAL_COMMUNITY)
Admission: EM | Admit: 2014-12-20 | Discharge: 2014-12-21 | Disposition: A | Discharge status: Home / Self Care | Payer: PRIVATE HEALTH INSURANCE | Attending: Emergency Medicine | Admitting: Emergency Medicine

## 2014-12-20 DIAGNOSIS — J45909 Unspecified asthma, uncomplicated: Secondary | ICD-10-CM | POA: Diagnosis not present

## 2014-12-20 DIAGNOSIS — Z862 Personal history of diseases of the blood and blood-forming organs and certain disorders involving the immune mechanism: Secondary | ICD-10-CM | POA: Diagnosis not present

## 2014-12-20 DIAGNOSIS — Z720 Tobacco use: Secondary | ICD-10-CM | POA: Insufficient documentation

## 2014-12-20 DIAGNOSIS — Z7951 Long term (current) use of inhaled steroids: Secondary | ICD-10-CM | POA: Diagnosis not present

## 2014-12-20 DIAGNOSIS — T464X5A Adverse effect of angiotensin-converting-enzyme inhibitors, initial encounter: Secondary | ICD-10-CM | POA: Insufficient documentation

## 2014-12-20 DIAGNOSIS — R011 Cardiac murmur, unspecified: Secondary | ICD-10-CM | POA: Diagnosis not present

## 2014-12-20 DIAGNOSIS — R110 Nausea: Secondary | ICD-10-CM | POA: Diagnosis not present

## 2014-12-20 DIAGNOSIS — T50905A Adverse effect of unspecified drugs, medicaments and biological substances, initial encounter: Secondary | ICD-10-CM

## 2014-12-20 DIAGNOSIS — R55 Syncope and collapse: Secondary | ICD-10-CM | POA: Diagnosis not present

## 2014-12-20 DIAGNOSIS — Z880 Allergy status to penicillin: Secondary | ICD-10-CM | POA: Insufficient documentation

## 2014-12-20 LAB — CBC WITH DIFFERENTIAL/PLATELET
BASOS ABS: 0 10*3/uL (ref 0.0–0.1)
BASOS PCT: 0 % (ref 0–1)
Eosinophils Absolute: 0.2 10*3/uL (ref 0.0–0.7)
Eosinophils Relative: 2 % (ref 0–5)
HEMATOCRIT: 42.9 % (ref 36.0–46.0)
Hemoglobin: 14.7 g/dL (ref 12.0–15.0)
Lymphocytes Relative: 29 % (ref 12–46)
Lymphs Abs: 3.1 10*3/uL (ref 0.7–4.0)
MCH: 29.2 pg (ref 26.0–34.0)
MCHC: 34.3 g/dL (ref 30.0–36.0)
MCV: 85.3 fL (ref 78.0–100.0)
MONO ABS: 0.9 10*3/uL (ref 0.1–1.0)
Monocytes Relative: 8 % (ref 3–12)
NEUTROS ABS: 6.6 10*3/uL (ref 1.7–7.7)
Neutrophils Relative %: 61 % (ref 43–77)
PLATELETS: 328 10*3/uL (ref 150–400)
RBC: 5.03 MIL/uL (ref 3.87–5.11)
RDW: 13.7 % (ref 11.5–15.5)
WBC: 10.8 10*3/uL — AB (ref 4.0–10.5)

## 2014-12-20 MED ORDER — SODIUM CHLORIDE 0.9 % IV BOLUS (SEPSIS)
1000.0000 mL | Freq: Once | INTRAVENOUS | Status: AC
  Administered 2014-12-20: 1000 mL via INTRAVENOUS

## 2014-12-20 NOTE — ED Notes (Signed)
Pt states that she started on benicar a week ago and since she hasn't felt right, she feels like she's going to pass out, her hands are numb and her legs are weak

## 2014-12-20 NOTE — ED Provider Notes (Signed)
CSN: [id number]     Arrival date & time 12/20/14  2216 History   This chart was scribed for Pricilla Loveless, MD by Freida Busman, ED Scribe. This patient was seen in room WA20/WA20 and the patient's care was started 11:11 PM.    Chief Complaint  Patient presents with  . Near Syncope    The history is provided by the patient. No language interpreter was used.   HPI Comments:  Dawn Atkinson is a 44 y.o. female with a history of HTN who presents to the Emergency Department complaining of constant lightheadedness for ~2 days. She reports a near syncopal episode today, states she "didn't feel right" and " everything wen blurry". She denies LOC. Pt states her HTN medication was recently changed from losartan 100mg  to  bencarHCT 40 mg/12.5mg  on the 12/12/14 after an episode of high blood pressure at 180/130. She states she hasn't felt right since but notes symptoms worsened today. She reports associated left sided jaw pain that radiates to her left ear, nausea, and  generalized body aches. She denies fever, SOB, vomiting, CP, and HA. No alleviating factors noted.  Past Medical History  Diagnosis Date  . Sickle cell trait   . Interstitial cystitis   . Vitamin D insufficiency   . Headache(784.0)   . Endometriosis   . Heart murmur   . Asthma    Past Surgical History  Procedure Laterality Date  . Abdominal hysterectomy    . Eye surgery    . Ankle surgery    . Cystectomy    . Bartholin gland cyst excision    . Inguinal hernia repair    . Dilation and curettage of uterus    . Laparoscopy     History reviewed. No pertinent family history. Social History  Substance Use Topics  . Smoking status: Current Some Day Smoker -- 0.25 packs/day  . Smokeless tobacco: None  . Alcohol Use: No   OB History    Gravida Para Term Preterm AB TAB SAB Ectopic Multiple Living   1 1        1      Review of Systems  Constitutional: Negative for fever.  HENT: Positive for ear pain.        + Jaw Pain    Respiratory: Negative for shortness of breath.   Cardiovascular: Negative for chest pain.  Gastrointestinal: Positive for nausea. Negative for vomiting.  Musculoskeletal: Positive for myalgias (Generalized).  Neurological: Positive for light-headedness. Negative for syncope and headaches.  All other systems reviewed and are negative.  Allergies  Avelox; Amoxicillin-pot clavulanate; Aspirin; Erythromycin; Gabapentin; Lisinopril; Oxycodone; Sulfa antibiotics; and Tessalon  Home Medications   Prior to Admission medications   Medication Sig Start Date End Date Taking? Authorizing Provider  acetaminophen (TYLENOL) 500 MG tablet Take 1,000 mg by mouth every 6 (six) hours as needed for moderate pain.   Yes Historical Provider, MD  B Complex-Folic Acid (B COMPLEX-VITAMIN B12 PO) Take 1 tablet by mouth daily.   Yes Historical Provider, MD  cetirizine (ZYRTEC) 10 MG tablet Take 10 mg by mouth daily as needed for allergies (allergies). For allergies   Yes Historical Provider, MD  [facility] Liver Oil CAPS Take 1 capsule by mouth daily.   Yes Historical Provider, MD  CRANBERRY PO Take 1 capsule by mouth daily.   Yes Historical Provider, MD  olmesartan-hydrochlorothiazide (BENICAR HCT) 40-12.5 MG per tablet Take 1 tablet by mouth daily.   Yes Historical Provider, MD  pantoprazole (PROTONIX) 40 MG tablet  Take 40 mg by mouth daily.   Yes Historical Provider, MD  VITAMIN D, ERGOCALCIFEROL, PO Take 5,000 Units by mouth daily.   Yes Historical Provider, MD  VITAMIN E PO Take 1 capsule by mouth every other day.   Yes Historical Provider, MD  albuterol (PROVENTIL HFA;VENTOLIN HFA) 108 (90 BASE) MCG/ACT inhaler Inhale 2 puffs into the lungs every 6 (six) hours as needed for wheezing or shortness of breath.     Historical Provider, MD  beclomethasone (QVAR) 40 MCG/ACT inhaler Inhale 2 puffs into the lungs 3 (three) times a week.    Historical Provider, MD  ipratropium (ATROVENT) 0.06 % nasal spray Place 2 sprays into  both nostrils 4 (four) times daily. Patient taking differently: Place 2 sprays into both nostrils 2 (two) times daily as needed.  01/15/14   Linna Hoff, MD   BP 131/90 mmHg  Pulse 85  Temp(Src) 98.7 F (37.1 C) (Oral)  Resp 20  Ht  (1.676 m)  Wt 179 lb (81.194 kg)  BMI 28.91 kg/m2  SpO2 100% Physical Exam  Constitutional: She is oriented to person, place, and time. She appears well-developed and well-nourished.  HENT:  Head: Normocephalic and atraumatic.  Right Ear: External ear normal.  Left Ear: External ear normal.  Nose: Nose normal.  Bilateral TMs nml   Eyes: EOM are normal. Pupils are equal, round, and reactive to light. Right eye exhibits no discharge. Left eye exhibits no discharge.  Cardiovascular: Normal rate, regular rhythm and normal heart sounds.   Pulmonary/Chest: Effort normal and breath sounds normal.  Abdominal: Soft. There is no tenderness.  Musculoskeletal:  No significant tenderness in large muscle groups   Neurological: She is alert and oriented to person, place, and time.  Cranial nerves 2-12 grossly intact 5/5 strength all 4 extremities   Skin: Skin is warm and dry.  Nursing note and vitals reviewed.   ED Course  Procedures   DIAGNOSTIC STUDIES:  Oxygen Saturation is 100% on RA, normal by my interpretation.     COORDINATION OF CARE:  11:15 PM Discussed treatment plan with pt at bedside and pt agreed to plan.  Labs Review Labs Reviewed  BASIC METABOLIC PANEL - Abnormal; Notable for the following:    Glucose, Bld 101 (*)    All other components within normal limits  CBC WITH DIFFERENTIAL/PLATELET - Abnormal; Notable for the following:    WBC 10.8 (*)    All other components within normal limits  CK    Imaging Review No results found. I have personally reviewed and evaluated these images and lab results as part of my medical decision-making.   EKG Interpretation   Date/Time:  Thursday December 20 2014 23:21:01 EDT Ventricular  Rate:  72 PR Interval:  171 QRS Duration: 86 QT Interval:  403 QTC Calculation: 441 R Axis:   44 Text Interpretation:  Normal sinus rhythm Abnormal R-wave progression,  early transition no significant change since Dec 04 2014 Confirmed by  Criss Alvine  MD, Daiden Coltrane [id number]) on 12/20/2014 11:31:40 PM      MDM   Final diagnoses:  Near syncope  Medication reaction, initial encounter    Patient's lightheadedness and "not feeling right" are most likely a side effect of her new blood pressure medicine. Her blood pressure is much better controlled than it was when she was here a couple weeks ago. She is neurologically intact. Lab work is unremarkable. No obvious etiology for her cramping and muscle pain given normal CK and normal electrolytes.  IV fluids and feels better. At this point will advise her to stop her new blood pressure medicine and call her primary care physician tomorrow morning for further changes in her medicine regimen.  I personally performed the services described in this documentation, which was scribed in my presence. The recorded information has been reviewed and is accurate.    Pricilla Loveless, MD 12/21/14 0530

## 2014-12-21 LAB — BASIC METABOLIC PANEL
Anion gap: 10 (ref 5–15)
BUN: 20 mg/dL (ref 6–20)
CALCIUM: 9.9 mg/dL (ref 8.9–10.3)
CO2: 27 mmol/L (ref 22–32)
CREATININE: 0.77 mg/dL (ref 0.44–1.00)
Chloride: 102 mmol/L (ref 101–111)
GFR calc Af Amer: >60 mL/min (ref >60)
GFR calc non Af Amer: >60 mL/min (ref >60)
GLUCOSE: 101 mg/dL — AB (ref 65–99)
Potassium: 4 mmol/L (ref 3.5–5.1)
Sodium: 139 mmol/L (ref 135–145)

## 2014-12-21 LAB — CK: Total CK: 145 U/L (ref 38–234)

## 2014-12-21 NOTE — ED Notes (Signed)
Pt ambulatory to restroom without assistance, steady gait.  

## 2015-01-23 ENCOUNTER — Encounter: Payer: Self-pay | Admitting: Cardiovascular Disease

## 2015-01-23 ENCOUNTER — Encounter (INDEPENDENT_AMBULATORY_CARE_PROVIDER_SITE_OTHER): Payer: PRIVATE HEALTH INSURANCE

## 2015-01-23 ENCOUNTER — Ambulatory Visit (INDEPENDENT_AMBULATORY_CARE_PROVIDER_SITE_OTHER): Payer: PRIVATE HEALTH INSURANCE | Admitting: Cardiovascular Disease

## 2015-01-23 VITALS — BP 138/90 | HR 68 | Ht 66.0 in | Wt 177.0 lb

## 2015-01-23 DIAGNOSIS — I10 Essential (primary) hypertension: Secondary | ICD-10-CM

## 2015-01-23 DIAGNOSIS — R5383 Other fatigue: Secondary | ICD-10-CM

## 2015-01-23 DIAGNOSIS — R002 Palpitations: Secondary | ICD-10-CM | POA: Diagnosis not present

## 2015-01-23 LAB — TSH: TSH: 1.638 u[IU]/mL (ref 0.350–4.500)

## 2015-01-23 NOTE — Progress Notes (Signed)
Cardiology Office Note   Date:  01/23/2015   ID:  Dawn Atkinson, DOB 05-05-1970, MRN [id number]  PCP:  Allean Found, MD  Cardiologist:   Madilyn Hook, MD   Chief Complaint  Patient presents with  . New Evaluation    no complaints other than fatigue (working, school)      History of Present Illness: Dawn Atkinson is a 45 y.o. female with hypertension and asthma who presents for hypertension management.  She has been working with her primary care physician to get her blood pressure under control. She was initially on losartan and HCTZ was added because of lower extremity edema. However with this addition she had a syncopal event. Hydrochlorothiazide was discontinued and she has been continuing on losartan 100 mg daily. Overall this has controlled her blood pressure well. Typically ranges from the 130s to 150s over 70s to 80s. She checks it regularly at work.  She states that her primary care physician recommended that she had an additional medication. However she has not yet started it, as she wanted to be evaluated in cardiology first. She's not had any high blood pressures recently, but in the past that spent as high as 200 systolic.  When her blood pressure gets this time she also notes chest pain. She does not get any chest pain with exertion  Dawn Atkinson also reports episodes of palpitations. This happens approximately once per week. She feels as though her heart is flopping around in her chest. She has to cough in order to catch her breath. Episodes last for a few moments and are not associated with chest pain, lightheadedness or dizziness.  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  She does not get much formal exercise as she is currently going to school and working at nights. When she summoned the daytime all she wants to do is sleep. She smokes 2-3  cigarettes daily.  Past Medical History  Diagnosis Date  . Sickle cell trait (HCC)   . Interstitial cystitis   . Vitamin D insufficiency   . Headache(784.0)   . Endometriosis   . Heart murmur   . Asthma     Past Surgical History  Procedure Laterality Date  . Abdominal hysterectomy    . Eye surgery    . Ankle surgery    . Cystectomy    . Bartholin gland cyst excision    . Inguinal hernia repair    . Dilation and curettage of uterus    . Laparoscopy       Current Outpatient Prescriptions  Medication Sig Dispense Refill  . acetaminophen (TYLENOL) 500 MG tablet Take 1,000 mg by mouth every 6 (six) hours as needed for moderate pain.    Marland Kitchen albuterol (PROVENTIL HFA;VENTOLIN HFA) 108 (90 BASE) MCG/ACT inhaler Inhale 2 puffs into the lungs every 6 (six) hours as needed for wheezing or shortness of breath.     . B Complex-Folic Acid (B COMPLEX-VITAMIN B12 PO) Take 1 tablet by mouth daily.    . beclomethasone (QVAR) 40 MCG/ACT inhaler Inhale 2 puffs into the lungs 3 (three) times a week.    . cetirizine (ZYRTEC) 10 MG tablet Take 10 mg by mouth daily as needed for allergies (allergies). For allergies    . Cod Liver Oil CAPS Take 1 capsule by mouth daily.    Marland Kitchen  CRANBERRY PO Take 1 capsule by mouth daily.    Marland Kitchen ipratropium (ATROVENT) 0.06 % nasal spray Place 2 sprays into both nostrils 4 (four) times daily. (Patient taking differently: Place 2 sprays into both nostrils 2 (two) times daily as needed. ) 15 mL 1  . pantoprazole (PROTONIX) 40 MG tablet Take 40 mg by mouth daily.    Marland Kitchen VITAMIN D, ERGOCALCIFEROL, PO Take 5,000 Units by mouth daily.    Marland Kitchen VITAMIN E PO Take 1 capsule by mouth every other day.     No current facility-administered medications for this visit.    Allergies:   Avelox; Amoxicillin-pot clavulanate; Aspirin; Erythromycin; Gabapentin; Lisinopril; Oxycodone; Sulfa antibiotics; and Tessalon    Social History:  The patient  reports that she has been smoking.  She does not  have any smokeless tobacco history on file. She reports that she does not drink alcohol or use illicit drugs.   Family History:  The patient's family history includes Alzheimer's disease in her maternal grandmother; Anxiety disorder in her sister; Asthma in her child; Cirrhosis in her paternal grandmother; Fibromyalgia in her mother; GER disease in her brother; Gout in her child; Hypertension in her father, mother, paternal grandfather, paternal grandmother, and sister; Pneumonia in her paternal grandfather; Prostate cancer in her maternal grandfather; Pulmonary Hypertension in her child; Vitamin D deficiency in her father.    ROS:  Please see the history of present illness.   Otherwise, review of systems are positive for none.   All other systems are reviewed and negative.    PHYSICAL EXAM: VS:  BP 138/90 mmHg  Pulse 68  Ht  (1.676 m)  Wt 80.287 kg (177 lb)  BMI 28.58 kg/m2 , BMI Body mass index is 28.58 kg/(m^2). GENERAL:  Well appearing HEENT:  Pupils equal round and reactive, fundi not visualized, oral mucosa unremarkable NECK:  No jugular venous distention, waveform within normal limits, carotid upstroke brisk and symmetric, no bruits, no thyromegaly LYMPHATICS:  No cervical adenopathy LUNGS:  Clear to auscultation bilaterally HEART:  RRR.  PMI not displaced or sustained,S1 and S2 within normal limits, no S3, no S4, no clicks, no rubs, II/VI systolic murmur at the RUSB ABD:  Flat, positive bowel sounds normal in frequency in pitch, no bruits, no rebound, no guarding, no midline pulsatile mass, no hepatomegaly, no splenomegaly EXT:  2 plus pulses throughout, no edema, no cyanosis no clubbing SKIN:  No rashes no nodules NEURO:  Cranial nerves II through XII grossly intact, motor grossly intact throughout PSYCH:  Cognitively intact, oriented to person place and time    EKG:  EKG is ordered today. The ekg ordered today demonstrates sinus rhythm at 68 bpm.   Recent  Labs: 06/27/2014: ALT 15 12/20/2014: BUN 20; Creatinine, Ser 0.77; Hemoglobin 14.7; Platelets 328; Potassium 4.0; Sodium 139    Lipid Panel No results found for: CHOL, TRIG, HDL, CHOLHDL, VLDL, LDLCALC, LDLDIRECT    Wt Readings from Last 3 Encounters:  01/23/15 80.287 kg (177 lb)  12/20/14 81.194 kg (179 lb)  12/04/14 79.379 kg (175 lb)      Other studies Reviewed: Additional studies/ records that were reviewed today include:  Review of the above records demonstrates:  Please see elsewhere in the note.     ASSESSMENT AND PLAN:  # Palpitations: Episodes seem to be sporadic and not exertional in nature. We will check her thyroid function, as asymmetric metabolic panel and CBC have been within normal limits recently. We will also obtain a 14  day event monitor to associate her underlying heart rhythm with her symptoms at the time she is symptomatic.  # Hypertension: Blood pressure is currently well-controlled. It seems that this dose of losartan is working well for her. She has poorly controlled hypertension in the future would consider adding low-dose amlodipine.  - Continue losartan 100mg   # Chest pain: Episodes only occur in the setting of extreme high blood pressure. She does not have any exertional symptoms. Given these factors and her age, we will not pursue an ischemia evaluation at this time. If she continues to have chest pain we will reconsider in the future.  Current medicines are reviewed at length with the patient today.  The patient does not have concerns regarding medicines.  The following changes have been made:  no change  Labs/ tests ordered today include:   Orders Placed This Encounter  Procedures  . TSH  . Cardiac event monitor  . EKG 12-Lead     Disposition:   FU with Lyndel Dancel C. Duke Salvia, MD as needed   Signed, Madilyn Hook, MD  01/23/2015 2:28 PM    [facility]

## 2015-01-23 NOTE — Patient Instructions (Signed)
Your physician has recommended that you wear an event monitor  FOR 14 DAYS. Event monitors are medical devices that record the heart's electrical activity. Doctors most often [place] these monitors to diagnose arrhythmias. Arrhythmias are problems with the speed or rhythm of the heartbeat. The monitor is a small, portable device. You can wear one while you do your normal daily activities. This is usually used to diagnose what is causing palpitations/syncope (passing out).  LABS --TSH -------MAY DO TODAY IF YOU LIKE.  WILL CALL YOU WITH RESULTS  Your physician recommends that you schedule a follow-up appointment AS NEEDED.

## 2015-01-28 ENCOUNTER — Telehealth: Payer: Self-pay | Admitting: *Deleted

## 2015-01-28 NOTE — Telephone Encounter (Signed)
Spoke to patient. Result given . Verbalized understanding  

## 2015-01-28 NOTE — Telephone Encounter (Signed)
-----   Message from Chilton Si, MD sent at 01/27/2015 11:21 AM EDT ----- Normal thyroid function.

## 2015-03-07 ENCOUNTER — Emergency Department (HOSPITAL_COMMUNITY): Payer: PRIVATE HEALTH INSURANCE

## 2015-03-07 ENCOUNTER — Emergency Department (HOSPITAL_COMMUNITY)
Admission: EM | Admit: 2015-03-07 | Discharge: 2015-03-07 | Disposition: A | Discharge status: Home / Self Care | Payer: PRIVATE HEALTH INSURANCE | Attending: Emergency Medicine | Admitting: Emergency Medicine

## 2015-03-07 ENCOUNTER — Encounter (HOSPITAL_COMMUNITY): Payer: Self-pay | Admitting: Emergency Medicine

## 2015-03-07 DIAGNOSIS — Z880 Allergy status to penicillin: Secondary | ICD-10-CM | POA: Insufficient documentation

## 2015-03-07 DIAGNOSIS — J45901 Unspecified asthma with (acute) exacerbation: Secondary | ICD-10-CM

## 2015-03-07 DIAGNOSIS — Z79899 Other long term (current) drug therapy: Secondary | ICD-10-CM | POA: Diagnosis not present

## 2015-03-07 DIAGNOSIS — F17210 Nicotine dependence, cigarettes, uncomplicated: Secondary | ICD-10-CM | POA: Diagnosis not present

## 2015-03-07 DIAGNOSIS — R011 Cardiac murmur, unspecified: Secondary | ICD-10-CM | POA: Insufficient documentation

## 2015-03-07 DIAGNOSIS — J45909 Unspecified asthma, uncomplicated: Secondary | ICD-10-CM | POA: Diagnosis present

## 2015-03-07 DIAGNOSIS — Z862 Personal history of diseases of the blood and blood-forming organs and certain disorders involving the immune mechanism: Secondary | ICD-10-CM | POA: Insufficient documentation

## 2015-03-07 LAB — CBC WITH DIFFERENTIAL/PLATELET
Basophils Absolute: 0 K/uL (ref 0.0–0.1)
Basophils Relative: 0 %
Eosinophils Absolute: 0.1 K/uL (ref 0.0–0.7)
Eosinophils Relative: 0 %
HCT: 37.9 % (ref 36.0–46.0)
Hemoglobin: 12.9 g/dL (ref 12.0–15.0)
Lymphocytes Relative: 6 %
Lymphs Abs: 1 K/uL (ref 0.7–4.0)
MCH: 28.4 pg (ref 26.0–34.0)
MCHC: 34 g/dL (ref 30.0–36.0)
MCV: 83.5 fL (ref 78.0–100.0)
Monocytes Absolute: 0.4 K/uL (ref 0.1–1.0)
Monocytes Relative: 3 %
Neutro Abs: 14.1 K/uL — ABNORMAL HIGH (ref 1.7–7.7)
Neutrophils Relative %: 91 %
Platelets: 270 K/uL (ref 150–400)
RBC: 4.54 MIL/uL (ref 3.87–5.11)
RDW: 13.8 % (ref 11.5–15.5)
WBC: 15.6 K/uL — ABNORMAL HIGH (ref 4.0–10.5)

## 2015-03-07 LAB — BASIC METABOLIC PANEL WITH GFR
Anion gap: 13 (ref 5–15)
BUN: 9 mg/dL (ref 6–20)
CO2: 23 mmol/L (ref 22–32)
Calcium: 9.7 mg/dL (ref 8.9–10.3)
Chloride: 104 mmol/L (ref 101–111)
Creatinine, Ser: 0.85 mg/dL (ref 0.44–1.00)
GFR calc Af Amer: >60 mL/min
GFR calc non Af Amer: >60 mL/min
Glucose, Bld: 163 mg/dL — ABNORMAL HIGH (ref 65–99)
Potassium: 3.2 mmol/L — ABNORMAL LOW (ref 3.5–5.1)
Sodium: 140 mmol/L (ref 135–145)

## 2015-03-07 MED ORDER — MAGNESIUM SULFATE 2 GM/50ML IV SOLN
2.0000 g | Freq: Once | INTRAVENOUS | Status: AC
  Administered 2015-03-07: 2 g via INTRAVENOUS
  Filled 2015-03-07: qty 50

## 2015-03-07 MED ORDER — ALBUTEROL (5 MG/ML) CONTINUOUS INHALATION SOLN
5.0000 mg/h | INHALATION_SOLUTION | Freq: Once | RESPIRATORY_TRACT | Status: AC
  Administered 2015-03-07: 5 mg/h via RESPIRATORY_TRACT
  Filled 2015-03-07: qty 20

## 2015-03-07 MED ORDER — IPRATROPIUM-ALBUTEROL 0.5-2.5 (3) MG/3ML IN SOLN
3.0000 mL | Freq: Once | RESPIRATORY_TRACT | Status: AC
  Administered 2015-03-07: 3 mL via RESPIRATORY_TRACT
  Filled 2015-03-07: qty 3

## 2015-03-07 MED ORDER — ALBUTEROL SULFATE (2.5 MG/3ML) 0.083% IN NEBU: 2.5000 mg | INHALATION_SOLUTION | Freq: Four times a day (QID) | RESPIRATORY_TRACT | Status: AC | PRN

## 2015-03-07 MED ORDER — ALBUTEROL SULFATE HFA 108 (90 BASE) MCG/ACT IN AERS: 2.0000 | INHALATION_SPRAY | Freq: Four times a day (QID) | RESPIRATORY_TRACT | Status: DC | PRN

## 2015-03-07 MED ORDER — SODIUM CHLORIDE 0.9 % IV BOLUS (SEPSIS)
500.0000 mL | Freq: Once | INTRAVENOUS | Status: AC
  Administered 2015-03-07: 500 mL via INTRAVENOUS

## 2015-03-07 MED ORDER — DEXAMETHASONE SODIUM PHOSPHATE 10 MG/ML IJ SOLN
10.0000 mg | Freq: Once | INTRAMUSCULAR | Status: AC
  Administered 2015-03-07: 10 mg via INTRAMUSCULAR
  Filled 2015-03-07: qty 1

## 2015-03-07 NOTE — ED Provider Notes (Signed)
CSN: [id number]     Arrival date & time 03/07/15  0747 History   First MD Initiated Contact with Patient 03/07/15 [phone]     Chief Complaint  Patient presents with  . Asthma     (Consider location/radiation/quality/duration/timing/severity/associated sxs/prior Treatment) HPI  Dawn Atkinson is a 44 y.o F with a pmhx of asthma presents the emergency department today complaining of shortness of breath and cough. Patient states that she began coughing which is progressively gotten worse. Cough is nonproductive. Patient feels that this is her asthma acting up. Also complaining of congestion and watery eyes. Patient is out of her home albuterol inhaler. Denies fever, chest pain, low extremity swelling, syncope, rhinorrhea, sore throat.  Past Medical History  Diagnosis Date  . Sickle cell trait (HCC)   . Interstitial cystitis   . Vitamin D insufficiency   . Headache(784.0)   . Endometriosis   . Heart murmur   . Asthma    Past Surgical History  Procedure Laterality Date  . Abdominal hysterectomy    . Eye surgery    . Ankle surgery    . Cystectomy    . Bartholin gland cyst excision    . Inguinal hernia repair    . Dilation and curettage of uterus    . Laparoscopy     Family History  Problem Relation Age of Onset  . Hypertension Mother   . Fibromyalgia Mother   . Hypertension Father   . Vitamin D deficiency Father   . Alzheimer's disease Maternal Grandmother   . Prostate cancer Maternal Grandfather   . Cirrhosis Paternal Grandmother   . Hypertension Paternal Grandmother   . Pneumonia Paternal Grandfather   . Hypertension Paternal Grandfather   . GER disease Brother   . Hypertension Sister   . Anxiety disorder Sister   . Asthma Child   . Pulmonary Hypertension Child   . Gout Child    Social History  Substance Use Topics  . Smoking status: Current Some Day Smoker -- 0.25 packs/day  . Smokeless tobacco: None  . Alcohol Use: No   OB History    Gravida Para Term Preterm  AB TAB SAB Ectopic Multiple Living   1 1        1      Review of Systems  All other systems reviewed and are negative.     Allergies  Avelox; Amoxicillin-pot clavulanate; Aspirin; Erythromycin; Gabapentin; Lisinopril; Oxycodone; Sulfa antibiotics; and Tessalon  Home Medications   Prior to Admission medications   Medication Sig Start Date End Date Taking? Authorizing Provider  acetaminophen (TYLENOL) 500 MG tablet Take 1,000 mg by mouth every 6 (six) hours as needed for moderate pain.   Yes Historical Provider, MD  albuterol (PROVENTIL HFA;VENTOLIN HFA) 108 (90 BASE) MCG/ACT inhaler Inhale 2 puffs into the lungs every 6 (six) hours as needed for wheezing or shortness of breath.    Yes Historical Provider, MD  B Complex-Folic Acid (B COMPLEX-VITAMIN B12 PO) Take 1 tablet by mouth daily.   Yes Historical Provider, MD  beclomethasone (QVAR) 40 MCG/ACT inhaler Inhale 2 puffs into the lungs 3 (three) times a week.   Yes Historical Provider, MD  cetirizine (ZYRTEC) 10 MG tablet Take 10 mg by mouth daily as needed for allergies (allergies). For allergies   Yes Historical Provider, MD  [facility] CAP[facility] Take 1 capsule by mouth daily.   Yes Historical Provider, MD  CRANBERRY PO Take 1 capsule by mouth daily.   Yes Historical Provider, MD  pantoprazole (PROTONIX) 40 MG tablet Take 40 mg by mouth daily.   Yes Historical Provider, MD  VITAMIN D, ERGOCALCIFEROL, PO Take 5,000 Units by mouth daily.   Yes Historical Provider, MD  VITAMIN E PO Take 1 capsule by mouth every other day.   Yes Historical Provider, MD  ipratropium (ATROVENT) 0.06 % nasal spray Place 2 sprays into both nostrils 4 (four) times daily. Patient not taking: Reported on 03/07/2015 01/15/14   Linna Hoff, MD   BP 143/84 mmHg  Pulse 102  Temp(Src) 99.6 F (37.6 C) (Oral)  Resp 18  SpO2 93% Physical Exam  Constitutional: She is oriented to person, place, and time. She appears well-developed and well-nourished. No distress.   HENT:  Head: Normocephalic and atraumatic.  Mouth/Throat: Oropharynx is clear and moist. No oropharyngeal exudate.  Eyes: Conjunctivae and EOM are normal. Pupils are equal, round, and reactive to light. Right eye exhibits no discharge. Left eye exhibits no discharge. No scleral icterus.  Neck: Neck supple.  Cardiovascular: Normal rate, regular rhythm, normal heart sounds and intact distal pulses.  Exam reveals no gallop and no friction rub.   No murmur heard. Pulmonary/Chest: Effort normal. No respiratory distress. She has wheezes. She has no rales. She exhibits no tenderness.  Shallow breath sounds. Wheezing heard in bilateral lung bases.  Abdominal: Soft. She exhibits no distension. There is no tenderness. There is no guarding.  Musculoskeletal: Normal range of motion. She exhibits no edema.  Lymphadenopathy:    She has no cervical adenopathy.  Neurological: She is alert and oriented to person, place, and time.  Strength 5/5 throughout. No sensory deficits.    Skin: Skin is warm and dry. No rash noted. She is not diaphoretic. No erythema. No pallor.  Psychiatric: She has a normal mood and affect. Her behavior is normal.  Nursing note and vitals reviewed.   ED Course  Procedures (including critical care time) Labs Review Labs Reviewed  BASIC METABOLIC PANEL - Abnormal; Notable for the following:    Potassium 3.2 (*)    Glucose, Bld 163 (*)    All other components within normal limits  CBC WITH DIFFERENTIAL/PLATELET - Abnormal; Notable for the following:    WBC 15.6 (*)    Neutro Abs 14.1 (*)    All other components within normal limits    Imaging Review Dg Chest 2 View  03/07/2015  CLINICAL DATA:  Asthma, shortness of breath, and cough ; sickle cell trait. EXAM: CHEST  2 VIEW COMPARISON:  PA and lateral chest x-ray of December 04, 2014 FINDINGS: The lungs are adequately inflated. The interstitial markings in the left upper lobe and right infrahilar region are slightly more  conspicuous today than in the past. There is no alveolar infiltrate. There is no pleural effusion. The heart and pulmonary vascularity are normal. The mediastinum is normal in width. There is stable mild levocurvature of the lower thoracic and upper lumbar spine. IMPRESSION: Subtle increased interstitial density in the left upper lobe and right infrahilar region may be normal for the patient. Minimal subsegmental atelectasis as might be seen with acute bronchitis could produce similar findings. There is no evidence of pneumonia nor CHF. Electronically Signed   By: David  [place] M.D.   On: 03/07/2015 10:11   I have personally reviewed and evaluated these images and lab results as part of my medical decision-making.   EKG Interpretation None      MDM   Final diagnoses:  Asthma, unspecified asthma severity, with acute exacerbation  44 year old female with history of asthma presents with soreness of breath and cough onset 1 day ago. Initial heart rate 99 bpm. Shallow breath sounds and wheezing appreciated on lung exam. We'll obtain chest x-ray and administered DuoNeb. Also given 10 mg Decadron IM.  Chest x-ray reveals subtle increased interstitial density in the left upper lobe and right infrahilar region that may be normal for the patient. Minimal subsegmental atelectasis. No evidence of pneumonia.  Patient reports symptomatic improvement after nebulizer and steroids. Will ambulate with pulse ox symmetry.  While ambulating patient became tachycardic to 150 bpm and desatted to 86%. Patient began coughing and her O2 dropped even more to 76%. Patient placed on continuous nebulizer and given IV magnesium. Blood work drawn.  WBC 15.6. Attribute this to steroids.  Pt with complete symptomatic improvement after magnesium and continuous nebulizer. Pt ambulated well and maintained an O2 saturation of 99%. Pt stable for discharge. Will d/c home with albuterol and nebulizer. Pt will follow up with PCP.  Return precautions outlined in patient discharge instructions.      Lester Kinsman [place], PA-C 03/07/15 1511  Raeford Razor, MD 03/08/15 [phone]

## 2015-03-07 NOTE — ED Notes (Signed)
Called xray inquiring about results.

## 2015-03-07 NOTE — ED Notes (Signed)
Pt is in stable condition upon d/c and ambulates from ED. 

## 2015-03-07 NOTE — ED Notes (Signed)
Pt SpO2 stayed at 99% and above during ambulation.

## 2015-03-07 NOTE — ED Notes (Signed)
Called phlebotomy and spoke with Rulon EisenmengerFelix to draw pt blood following 2 unsuccessful attempts by tech.

## 2015-03-07 NOTE — ED Notes (Signed)
Pt reports she has been experiencing an asthma exacerbation through the night last night while working and feels as though it is getting worse. Pt speaking in full sentences. Pt reports cough associated. NAD at this time. Pt alert x4.

## 2015-03-07 NOTE — ED Notes (Signed)
While ambulating pt HR increased to 156 and SpO2 decreased to 86% and stayed there until pt began coughing and O2 dropped to 76%. Lelon MastSamantha, PA informed.

## 2015-03-07 NOTE — Discharge Instructions (Signed)
Asthma, Acute Bronchospasm °Acute bronchospasm caused by asthma is also referred to as an asthma attack. Bronchospasm means your air passages become narrowed. The narrowing is caused by inflammation and tightening of the muscles in the air tubes (bronchi) in your lungs. This can make it hard to breathe or cause you to wheeze and cough. °CAUSES °Possible triggers are: °· Animal dander from the skin, hair, or feathers of animals. °· Dust mites contained in house dust. °· Cockroaches. °· Pollen from trees or grass. °· Mold. °· Cigarette or tobacco smoke. °· Air pollutants such as dust, household cleaners, hair sprays, aerosol sprays, paint fumes, strong chemicals, or strong odors. °· Cold air or weather changes. Cold air may trigger inflammation. Winds increase molds and pollens in the air. °· Strong emotions such as crying or laughing hard. °· Stress. °· Certain medicines such as aspirin or beta-blockers. °· Sulfites in foods and drinks, such as dried fruits and wine. °· Infections or inflammatory conditions, such as a flu, cold, or inflammation of the nasal membranes (rhinitis). °· Gastroesophageal reflux disease (GERD). GERD is a condition where stomach acid backs up into your esophagus. °· Exercise or strenuous activity. °SIGNS AND SYMPTOMS  °· Wheezing. °· Excessive coughing, particularly at night. °· Chest tightness. °· Shortness of breath. °DIAGNOSIS  °Your health care provider will ask you about your medical history and perform a physical exam. A chest X-ray or blood testing may be performed to look for other causes of your symptoms or other conditions that may have triggered your asthma attack.  °TREATMENT  °Treatment is aimed at reducing inflammation and opening up the airways in your lungs.  Most asthma attacks are treated with inhaled medicines. These include quick relief or rescue medicines (such as bronchodilators) and controller medicines (such as inhaled corticosteroids). These medicines are sometimes  given through an inhaler or a nebulizer. Systemic steroid medicine taken by mouth or given through an IV tube also can be used to reduce the inflammation when an attack is moderate or severe. Antibiotic medicines are only used if a bacterial infection is present.  °HOME CARE INSTRUCTIONS  °· Rest. °· Drink plenty of liquids. This helps the mucus to remain thin and be easily coughed up. Only use caffeine in moderation and do not use alcohol until you have recovered from your illness. °· Do not smoke. Avoid being exposed to secondhand smoke. °· You play a critical role in keeping yourself in good health. Avoid exposure to things that cause you to wheeze or to have breathing problems. °· Keep your medicines up-to-date and available. Carefully follow your health care provider's treatment plan. °· Take your medicine exactly as prescribed. °· When pollen or pollution is bad, keep windows closed and use an air conditioner or go to places with air conditioning. °· Asthma requires careful medical care. See your health care provider for a follow-up as advised. If you are more than [redacted] weeks pregnant and you were prescribed any new medicines, let your obstetrician know about the visit and how you are doing. Follow up with your health care provider as directed. °· After you have recovered from your asthma attack, make an appointment with your outpatient doctor to talk about ways to reduce the likelihood of future attacks. If you do not have a doctor who manages your asthma, make an appointment with a primary care doctor to discuss your asthma. °SEEK IMMEDIATE MEDICAL CARE IF:  °· You are getting worse. °· You have trouble breathing. If severe, call your local   emergency services (911 in the [place] °· You develop chest pain or discomfort. °· You are vomiting. °· You are not able to keep fluids down. °· You are coughing up yellow, green, brown, or bloody sputum. °· You have a fever and your symptoms suddenly get worse. °· You have  trouble swallowing. °MAKE SURE YOU:  °· Understand these instructions. °· Will watch your condition. °· Will get help right away if you are not doing well or get worse. °  °This information is not intended to replace advice given to you by your health care provider. Make sure you discuss any questions you have with your health care provider. °  °Document Released: 07/22/2006 Document Revised: 04/11/2013 Document Reviewed: 10/12/2012 °Elsevier Interactive Patient Education ©2016 [employer] ° °Asthma Attack Prevention °While you may not be able to control the fact that you have asthma, you can take actions to prevent asthma attacks. The best way to prevent asthma attacks is to maintain good control of your asthma. You can achieve this by: °· Taking your medicines as directed. °· Avoiding things that can irritate your airways or make your asthma symptoms worse (asthma triggers). °· Keeping track of how well your asthma is controlled and of any changes in your symptoms. °· Responding quickly to worsening asthma symptoms (asthma attack). °· Seeking emergency care when it is needed. °WHAT ARE SOME WAYS TO PREVENT AN ASTHMA ATTACK? °Have a Plan °Work with your health care provider to create a written plan for managing and treating your asthma attacks (asthma action plan). This plan includes: °· A list of your asthma triggers and how you can avoid them. °· Information on when medicines should be taken and when their dosages should be changed. °· The use of a device that measures how well your lungs are working (peak flow meter). °Monitor Your Asthma °Use your peak flow meter and record your results in a journal every day. A drop in your peak flow numbers on one or more days may indicate the start of an asthma attack. This can happen even before you start to feel symptoms. You can prevent an asthma attack from getting worse by following the steps in your asthma action plan. °Avoid Asthma Triggers °Work with your asthma  health care provider to find out what your asthma triggers are. This can be done by: °· Allergy testing. °· Keeping a journal that notes when asthma attacks occur and the factors that may have contributed to them. °· Determining if there are other medical conditions that are making your asthma worse. °Once you have determined your asthma triggers, take steps to avoid them. This may include avoiding excessive or prolonged exposure to: °· Dust. Have someone dust and vacuum your home for you once or twice a week. Using a high-efficiency particulate arrestance (HEPA) vacuum is best. °· Smoke. This includes campfire smoke, forest fire smoke, and secondhand smoke from tobacco products. °· Pet dander. Avoid contact with animals that you know you are allergic to. °· Allergens from trees, grasses or pollens. Avoid spending a lot of time outdoors when pollen counts are high, and on very windy days. °· Very cold, dry, or humid air. °· Mold. °· Foods that contain high amounts of sulfites. °· Strong odors. °· Outdoor air pollutants, such as engine exhaust. °· Indoor air pollutants, such as aerosol sprays and fumes from household cleaners. °· Household pests, including dust mites and cockroaches, and pest droppings. °· Certain medicines, including NSAIDs. Always talk to your health   care provider before stopping or starting any new medicines. Medicines Take over-the-counter and prescription medicines only as told by your health care provider. Many asthma attacks can be prevented by carefully following your medicine schedule. Taking your medicines correctly is especially important when you cannot avoid certain asthma triggers. Act Quickly If an asthma attack does happen, acting quickly can decrease how severe it is and how long it lasts. Take these steps:   Pay attention to your symptoms. If you are coughing, wheezing, or having difficulty breathing, do not wait to see if your symptoms go away on their own. Follow your asthma  action plan.  If you have followed your asthma action plan and your symptoms are not improving, call your health care provider or seek immediate medical care at the nearest hospital. It is important to note how often you need to use your fast-acting rescue inhaler. If you are using your rescue inhaler more often, it may mean that your asthma is not under control. Adjusting your asthma treatment plan may help you to prevent future asthma attacks and help you to gain better control of your condition. HOW CAN I PREVENT AN ASTHMA ATTACK WHEN I EXERCISE? Follow advice from your health care provider about whether you should use your fast-acting inhaler before exercising. Many people with asthma experience exercise-induced bronchoconstriction (EIB). This condition often worsens during vigorous exercise in cold, humid, or dry environments. Usually, people with EIB can stay very active by pre-treating with a fast-acting inhaler before exercising.   This information is not intended to replace advice given to you by your health care provider. Make sure you discuss any questions you have with your health care provider.   Follow up with your PCP in 24-48 hours for re-evaluation. Take albuterol inhaler and nebulizer treatment as needed for asthma. Take OTC mucinex for congestion. Take ibuprofen as needed for headaches. Encourage rest and hydration. Return to the emergency department if you experience worsening of your symptoms, difficulty breathing, fever, chest pain.

## 2016-01-28 ENCOUNTER — Other Ambulatory Visit: Payer: Self-pay | Admitting: Obstetrics and Gynecology

## 2016-01-28 DIAGNOSIS — Z1231 Encounter for screening mammogram for malignant neoplasm of breast: Secondary | ICD-10-CM

## 2016-02-13 ENCOUNTER — Ambulatory Visit (HOSPITAL_COMMUNITY)
Admission: RE | Admit: 2016-02-13 | Discharge: 2016-02-13 | Disposition: A | Discharge status: Home / Self Care | Payer: Self-pay | Source: Ambulatory Visit | Attending: Obstetrics and Gynecology | Admitting: Obstetrics and Gynecology

## 2016-02-13 ENCOUNTER — Encounter (HOSPITAL_COMMUNITY): Payer: Self-pay

## 2016-02-13 ENCOUNTER — Ambulatory Visit
Admission: RE | Admit: 2016-02-13 | Discharge: 2016-02-13 | Disposition: A | Discharge status: Home / Self Care | Payer: Self-pay | Source: Ambulatory Visit | Attending: Obstetrics and Gynecology | Admitting: Obstetrics and Gynecology

## 2016-02-13 VITALS — BP 140/90 | Ht 66.0 in | Wt 178.6 lb

## 2016-02-13 DIAGNOSIS — Z1231 Encounter for screening mammogram for malignant neoplasm of breast: Secondary | ICD-10-CM

## 2016-02-13 DIAGNOSIS — Z1239 Encounter for other screening for malignant neoplasm of breast: Secondary | ICD-10-CM

## 2016-02-13 HISTORY — DX: Essential (primary) hypertension: I10

## 2016-02-13 NOTE — Patient Instructions (Addendum)
Explained breast self awareness to Dawn Atkinson. Patient did not need a Pap smear today due to her history of a hysterectomy for benign reasons. Let patient know that she doesn't need any further Pap smears due to her history of a hysterectomy for benign reasons. [facility]. Appointment scheduled for Thursday, February 13, 2016 at 1020. Let patient know the Breast Center will follow up with her within the next couple weeks with results of mammogram by letter or phone. Discussed smoking cessation with patient. Referred patient to the [facility] Quitline and gave resources to free smoking cessation classes offered at [facility]. Dawn Atkinson verbalized understanding.  Chaitanya Amedee, Kathaleen Maserhristine Poll, RN 11:43 AM

## 2016-02-13 NOTE — Progress Notes (Signed)
No complaints today.   Pap Smear:  Pap smear not completed today. Last Pap smear was in 2015 by Dr. Jackelyn KnifeMeisinger and normal per patient. Per patient has a history of an abnormal Pap smear in 1997-1999 that a colposcopy was completed for follow up. Patient states all Pap smears have been normal since colposcopy. Patient has a history of a hysterectomy 10/12/2007 for AUB, Pelvic Pain, and Endometriosis. Patient no longer needs Pap smears due to her history of a hysterectomy for benign reasons per BCCCP and ACOG guidelines. No Pap smear results are in EPIC.   Physical exam: Breasts Breasts symmetrical. No skin abnormalities bilateral breasts. No nipple retraction bilateral breasts. No nipple discharge bilateral breasts. No lymphadenopathy. No lumps palpated bilateral breasts. Complaints of left lower outer quadrant tenderness on exam. [facility] screening mamm[facility]. Appointment scheduled for Thursday, February 13, 2016 at 1020.        Pelvic/Bimanual No Pap smear completed today since patient has a history of a hysterectomy for benign reasons. Pap smear not indicated per BCCCP guidelines.   Smoking History: Patient his a current smoker. Discussed smoking cessation with patient. Referred patient to the [facility] and gave resources to free smoking cessation classes offered at [facility].  Patient Navigation: Patient education provided. Access to services provided for patient through [facility] program.

## 2016-02-17 ENCOUNTER — Encounter (HOSPITAL_COMMUNITY): Payer: Self-pay | Admitting: *Deleted

## 2016-04-16 ENCOUNTER — Encounter (HOSPITAL_COMMUNITY): Payer: Self-pay | Admitting: *Deleted

## 2016-04-16 ENCOUNTER — Inpatient Hospital Stay (HOSPITAL_COMMUNITY)
Admission: AD | Admit: 2016-04-16 | Discharge: 2016-04-16 | Disposition: A | Discharge status: Home / Self Care | Payer: Self-pay | Source: Ambulatory Visit | Attending: Obstetrics & Gynecology | Admitting: Obstetrics & Gynecology

## 2016-04-16 ENCOUNTER — Inpatient Hospital Stay (HOSPITAL_COMMUNITY): Payer: Self-pay

## 2016-04-16 DIAGNOSIS — Z888 Allergy status to other drugs, medicaments and biological substances status: Secondary | ICD-10-CM | POA: Insufficient documentation

## 2016-04-16 DIAGNOSIS — I10 Essential (primary) hypertension: Secondary | ICD-10-CM | POA: Insufficient documentation

## 2016-04-16 DIAGNOSIS — Z882 Allergy status to sulfonamides status: Secondary | ICD-10-CM | POA: Insufficient documentation

## 2016-04-16 DIAGNOSIS — Z8249 Family history of ischemic heart disease and other diseases of the circulatory system: Secondary | ICD-10-CM | POA: Insufficient documentation

## 2016-04-16 DIAGNOSIS — N3010 Interstitial cystitis (chronic) without hematuria: Secondary | ICD-10-CM

## 2016-04-16 DIAGNOSIS — R102 Pelvic and perineal pain: Secondary | ICD-10-CM | POA: Insufficient documentation

## 2016-04-16 DIAGNOSIS — Z90710 Acquired absence of both cervix and uterus: Secondary | ICD-10-CM | POA: Insufficient documentation

## 2016-04-16 DIAGNOSIS — Z79899 Other long term (current) drug therapy: Secondary | ICD-10-CM | POA: Insufficient documentation

## 2016-04-16 DIAGNOSIS — E559 Vitamin D deficiency, unspecified: Secondary | ICD-10-CM | POA: Insufficient documentation

## 2016-04-16 DIAGNOSIS — F17210 Nicotine dependence, cigarettes, uncomplicated: Secondary | ICD-10-CM | POA: Insufficient documentation

## 2016-04-16 DIAGNOSIS — Z7951 Long term (current) use of inhaled steroids: Secondary | ICD-10-CM | POA: Insufficient documentation

## 2016-04-16 DIAGNOSIS — Z885 Allergy status to narcotic agent status: Secondary | ICD-10-CM | POA: Insufficient documentation

## 2016-04-16 DIAGNOSIS — Z825 Family history of asthma and other chronic lower respiratory diseases: Secondary | ICD-10-CM | POA: Insufficient documentation

## 2016-04-16 DIAGNOSIS — D573 Sickle-cell trait: Secondary | ICD-10-CM | POA: Insufficient documentation

## 2016-04-16 DIAGNOSIS — Z881 Allergy status to other antibiotic agents status: Secondary | ICD-10-CM | POA: Insufficient documentation

## 2016-04-16 DIAGNOSIS — R110 Nausea: Secondary | ICD-10-CM | POA: Insufficient documentation

## 2016-04-16 LAB — URINALYSIS, ROUTINE W REFLEX MICROSCOPIC
Bilirubin Urine: NEGATIVE
GLUCOSE, UA: NEGATIVE mg/dL
KETONES UR: NEGATIVE mg/dL
Nitrite: NEGATIVE
PROTEIN: NEGATIVE mg/dL
Specific Gravity, Urine: 1.013 (ref 1.005–1.030)
pH: 8 (ref 5.0–8.0)

## 2016-04-16 LAB — CBC
HCT: 38.6 % (ref 36.0–46.0)
Hemoglobin: 13.6 g/dL (ref 12.0–15.0)
MCH: 29.2 pg (ref 26.0–34.0)
MCHC: 35.2 g/dL (ref 30.0–36.0)
MCV: 82.8 fL (ref 78.0–100.0)
PLATELETS: 265 10*3/uL (ref 150–400)
RBC: 4.66 MIL/uL (ref 3.87–5.11)
RDW: 14.4 % (ref 11.5–15.5)
WBC: 11.8 10*3/uL — ABNORMAL HIGH (ref 4.0–10.5)

## 2016-04-16 LAB — POCT PREGNANCY, URINE: Preg Test, Ur: NEGATIVE

## 2016-04-16 MED ORDER — HYDROMORPHONE HCL 2 MG PO TABS: 2.0000 mg | ORAL_TABLET | ORAL | 0 refills | Status: DC | PRN

## 2016-04-16 MED ORDER — HYDROMORPHONE HCL 2 MG PO TABS
2.0000 mg | ORAL_TABLET | ORAL | Status: DC | PRN
Start: 2016-04-16 — End: 2016-04-16
  Administered 2016-04-16: 2 mg via ORAL
  Filled 2016-04-16: qty 1

## 2016-04-16 MED ORDER — DOCUSATE SODIUM 100 MG PO CAPS: 100.0000 mg | ORAL_CAPSULE | Freq: Two times a day (BID) | ORAL | 2 refills | Status: DC | PRN

## 2016-04-16 NOTE — MAU Note (Signed)
Urine in lab 

## 2016-04-16 NOTE — MAU Provider Note (Signed)
History     CSN: [id number]  Arrival date and time: 04/16/16 1427   None     Chief Complaint  Patient presents with  . Pelvic Pain  . pelvic pressure   HPI 45 yo [id number] presenting today for the evaluation of worsening pelvic pain. Patient reports onset of the pain on 04/06/16. She reports the pain as worsening cramping pain localized in her pelvis, similar to labor pains. Her pain is more pronouced in her RLQ. She reports a history of ovarian cysts and interstitial cystitis. She was seen by a PCP on 04/06/16 and treated for BV and a yeast infection. She was advised to use ibuprofen and heating pad for possible ovarian cyst. Over the past 2 days, the pain worsen significantly. She reports some nausea without emesis. She denies any vaginal bleeding.   Past Medical History:  Diagnosis Date  . Asthma   . Endometriosis   . Headache(784.0)   . Heart murmur   . Hypertension   . Interstitial cystitis   . Sickle cell trait (HCC)   . Vitamin D insufficiency     Past Surgical History:  Procedure Laterality Date  . ABDOMINAL HYSTERECTOMY    . ANKLE SURGERY    . BARTHOLIN GLAND CYST EXCISION    . CYSTECTOMY    . DILATION AND CURETTAGE OF UTERUS    . EYE SURGERY    . INGUINAL HERNIA REPAIR    . LAPAROSCOPY      Family History  Problem Relation Age of Onset  . Hypertension Mother   . Fibromyalgia Mother   . Hypertension Father   . Vitamin D deficiency Father   . Alzheimer's disease Maternal Grandmother   . Prostate cancer Maternal Grandfather   . Cirrhosis Paternal Grandmother   . Hypertension Paternal Grandmother   . Pneumonia Paternal Grandfather   . Hypertension Paternal Grandfather   . GER disease Brother   . Hypertension Sister   . Anxiety disorder Sister   . Asthma Child   . Pulmonary Hypertension Child   . Gout Child     Social History  Substance Use Topics  . Smoking status: Current Some Day Smoker    Packs/day: 0.25  . Smokeless tobacco: Never Used  .  Alcohol use No    Allergies:  Allergies  Allergen Reactions  . Avelox [Moxifloxacin Hydrochloride] Anaphylaxis  . Amoxicillin-Pot Clavulanate Swelling  . Aspirin Diarrhea  . Erythromycin Other (See Comments)    Unknown  . Gabapentin     hallucinations   . Lisinopril Other (See Comments)    Induced patient asthma  . Oxycodone Hives  . Sulfa Antibiotics Hives  . Tessalon [Benzonatate] Other (See Comments)    WHOOPING COUGH     Prescriptions Prior to Admission  Medication Sig Dispense Refill Last Dose  . acetaminophen (TYLENOL) 500 MG tablet Take 1,000 mg by mouth every 6 (six) hours as needed for moderate pain.   Taking  . albuterol (PROVENTIL HFA;VENTOLIN HFA) 108 (90 BASE) MCG/ACT inhaler Inhale 2 puffs into the lungs every 6 (six) hours as needed for wheezing or shortness of breath. 1 Inhaler 0 Taking  . albuterol (PROVENTIL) (2.5 MG/3ML) 0.083% nebulizer solution Take 3 mLs (2.5 mg total) by nebulization every 6 (six) hours as needed for wheezing or shortness of breath. 75 mL 12 Taking  . B Complex-Folic Acid (B COMPLEX-VITAMIN B12 PO) Take 1 tablet by mouth daily.   Taking  . beclomethasone (QVAR) 40 MCG/ACT inhaler Inhale 2 puffs into the lungs  3 (three) times a week.   Taking  . cetirizine (ZYRTEC) 10 MG tablet Take 10 mg by mouth daily as needed for allergies (allergies). For allergies   Taking  . Cod Liver Oil CAPS Take 1 capsule by mouth daily.   Taking  . CRANBERRY PO Take 1 capsule by mouth daily.   Not Taking  . ipratropium (ATROVENT) 0.06 % nasal spray Place 2 sprays into both nostrils 4 (four) times daily. (Patient not taking: Reported on 02/13/2016) 15 mL 1 Not Taking  . pantoprazole (PROTONIX) 40 MG tablet Take 40 mg by mouth daily.   Not Taking  . VITAMIN D, ERGOCALCIFEROL, PO Take 5,000 Units by mouth daily.   Taking  . VITAMIN E PO Take 1 capsule by mouth every other day.   Taking    ROS  See pertinent in HPI Physical Exam   Blood pressure 138/90, pulse 82,  temperature 98.3 F (36.8 C), temperature source Oral, resp. rate 20, height 5\' 6"  (1.676 m), weight 180 lb (81.6 kg).  Physical Exam GENERAL: Well-developed, well-nourished female in no acute distress.  ABDOMEN: Soft, nondistended. Lower abdominal tenderness more pronounced in the suprapubic region PELVIC: Normal external female genitalia. Vagina is pink and rugated.  Normal discharge. Discomfort expressed in right adnexal region and behind the bladder EXTREMITIES: No cyanosis, clubbing, or edema, 2+ distal pulses.  Pelvic sono- prelim: uterus surgically absent. Right and left ovaries not visualized. No adnexal mass visualized  MAU Course  Procedures  MDM UA- negative CBC    Component Value Date/Time   WBC 11.8 (H) 04/16/2016 1539   RBC 4.66 04/16/2016 1539   HGB 13.6 04/16/2016 1539   HCT 38.6 04/16/2016 1539   PLT 265 04/16/2016 1539   MCV 82.8 04/16/2016 1539   MCH 29.2 04/16/2016 1539   MCHC 35.2 04/16/2016 1539   RDW 14.4 04/16/2016 1539   LYMPHSABS 1.0 03/07/2015 1257   MONOABS 0.4 03/07/2015 1257   EOSABS 0.1 03/07/2015 1257   BASOSABS 0.0 03/07/2015 1257     Assessment and Plan  45 yo with normal ultrasound and likely interstitial cystitis flare up - Rx dilaudid provided to help with the pain - Patient plans to follow up with urologist in January for further management - patient will be contacted with results of urine culture if abnormal  Dawn Atkinson 04/16/2016, 3:22 PM

## 2016-04-16 NOTE — MAU Note (Signed)
Pt C/O severe pelvic pain & pressure for the last 2 days.  Pt has had hysterectomy, still has ovaries.  Also has hx of interstitial cystitis.  Has dysuria & urinary frequency.  Pt says her mother states that the patient passed out this morning, pt is unsure.  Denies vaginal bleeding or discharge.

## 2016-04-16 NOTE — Discharge Instructions (Signed)
Interstitial Cystitis Introduction Interstitial cystitis is a condition that causes inflammation of the bladder. The bladder is a hollow organ in the lower part of your abdomen. It stores urine after the urine is made by your kidneys. With interstitial cystitis, you may have pain in the bladder area. You may also have a frequent and urgent need to urinate. The severity of interstitial cystitis can vary from person to person. You may have flare-ups of the condition, and then it may go away for a while. For many people who have this condition, it becomes a long-term problem. What are the causes? The cause of this condition is not known. What increases the risk? This condition is more likely to develop in women. What are the signs or symptoms? Symptoms of interstitial cystitis vary, and they can change over time. Symptoms may include:  Discomfort or pain in the bladder area. This can range from mild to severe. The pain may change in intensity as the bladder fills with urine or as it empties.  Pelvic pain.  An urgent need to urinate.  Frequent urination.  Pain during sexual intercourse.  Pinpoint bleeding on the bladder wall. For women, the symptoms often get worse during menstruation. How is this diagnosed? This condition is diagnosed by evaluating your symptoms and ruling out other causes. A physical exam will be done. Various tests may be done to rule out other conditions. Common tests include:  Urine tests.  Cystoscopy. In this test, a tool that is like a very thin telescope is used to look into your bladder.  Biopsy. This involves taking a sample of tissue from the bladder wall to be examined under a microscope. How is this treated? There is no cure for interstitial cystitis, but treatment methods are available to control your symptoms. Work closely with your health care provider to find the treatments that will be most effective for you. Treatment options may include:  Medicines  to relieve pain and to help reduce the number of times that you feel the need to urinate.  Bladder training. This involves learning ways to control when you urinate, such as:  Urinating at scheduled times.  Training yourself to delay urination.  Doing exercises (Kegel exercises) to strengthen the muscles that control urine flow.  Lifestyle changes, such as changing your diet or taking steps to control stress.  Use of a device that provides electrical stimulation in order to reduce pain.  A procedure that stretches your bladder by filling it with air or fluid.  Surgery. This is rare. It is only done for extreme cases if other treatments do not help. Follow these instructions at home:  Take medicines only as directed by your health care provider.  Use bladder training techniques as directed.  Keep a bladder diary to find out which foods, liquids, or activities make your symptoms worse.  Use your bladder diary to schedule bathroom trips. If you are away from home, plan to be near a bathroom at each of your scheduled times.  Make sure you urinate just before you leave the house and just before you go to bed.  Do Kegel exercises as directed by your health care provider.  Do not drink alcohol.  Do not use any tobacco products, including cigarettes, chewing tobacco, or electronic cigarettes. If you need help quitting, ask your health care provider.  Make dietary changes as directed by your health care provider. You may need to avoid spicy foods and foods that contain a high amount of potassium.    Limit your drinking of beverages that stimulate urination. These include soda, coffee, and tea.  Keep all follow-up visits as directed by your health care provider. This is important. Contact a health care provider if:  Your symptoms do not get better after treatment.  Your pain and discomfort are getting worse.  You have more frequent urges to urinate.  You have a fever. Get help  right away if:  You are not able to control your bladder at all. This information is not intended to replace advice given to you by your health care provider. Make sure you discuss any questions you have with your health care provider. Document Released: 12/06/2003 Document Revised: 09/12/2015 Document Reviewed: 12/12/2013  2017 Elsevier  

## 2016-04-18 LAB — URINE CULTURE: Culture: >=100000 — AB

## 2016-04-19 ENCOUNTER — Telehealth: Payer: Self-pay | Admitting: Student

## 2016-04-19 NOTE — Telephone Encounter (Signed)
Left voicemail for pt to return call. Needs tx for + urine culture.

## 2016-04-21 ENCOUNTER — Telehealth: Payer: Self-pay | Admitting: Student

## 2016-04-21 DIAGNOSIS — N3000 Acute cystitis without hematuria: Secondary | ICD-10-CM

## 2016-04-21 MED ORDER — NITROFURANTOIN MONOHYD MACRO 100 MG PO CAPS: 100.0000 mg | ORAL_CAPSULE | Freq: Two times a day (BID) | ORAL | 0 refills | Status: DC

## 2016-04-21 NOTE — Telephone Encounter (Signed)
Per FYI, pt authorized detailed messages to be left on her phone. Let voicemail. Notified of + urine culture & rx sent to pharmacy on file.

## 2016-04-22 ENCOUNTER — Other Ambulatory Visit: Payer: Self-pay | Admitting: Obstetrics and Gynecology

## 2016-07-23 ENCOUNTER — Emergency Department (HOSPITAL_COMMUNITY): Payer: Managed Care, Other (non HMO)

## 2016-07-23 ENCOUNTER — Encounter (HOSPITAL_COMMUNITY): Payer: Self-pay | Admitting: Emergency Medicine

## 2016-07-23 ENCOUNTER — Emergency Department (HOSPITAL_COMMUNITY)
Admission: EM | Admit: 2016-07-23 | Discharge: 2016-07-23 | Disposition: A | Discharge status: Home / Self Care | Payer: Managed Care, Other (non HMO) | Attending: Emergency Medicine | Admitting: Emergency Medicine

## 2016-07-23 DIAGNOSIS — I10 Essential (primary) hypertension: Secondary | ICD-10-CM | POA: Insufficient documentation

## 2016-07-23 DIAGNOSIS — J45909 Unspecified asthma, uncomplicated: Secondary | ICD-10-CM | POA: Insufficient documentation

## 2016-07-23 DIAGNOSIS — J069 Acute upper respiratory infection, unspecified: Secondary | ICD-10-CM | POA: Diagnosis not present

## 2016-07-23 DIAGNOSIS — J029 Acute pharyngitis, unspecified: Secondary | ICD-10-CM | POA: Diagnosis present

## 2016-07-23 DIAGNOSIS — F17200 Nicotine dependence, unspecified, uncomplicated: Secondary | ICD-10-CM | POA: Diagnosis not present

## 2016-07-23 LAB — COMPREHENSIVE METABOLIC PANEL
ALT: 31 U/L (ref 14–54)
AST: 32 U/L (ref 15–41)
Albumin: 4.1 g/dL (ref 3.5–5.0)
Alkaline Phosphatase: 93 U/L (ref 38–126)
Anion gap: 9 (ref 5–15)
BUN: 9 mg/dL (ref 6–20)
CHLORIDE: 101 mmol/L (ref 101–111)
CO2: 27 mmol/L (ref 22–32)
Calcium: 9.3 mg/dL (ref 8.9–10.3)
Creatinine, Ser: 0.65 mg/dL (ref 0.44–1.00)
GFR calc non Af Amer: >60 mL/min (ref >60)
Glucose, Bld: 121 mg/dL — ABNORMAL HIGH (ref 65–99)
POTASSIUM: 3.6 mmol/L (ref 3.5–5.1)
Sodium: 137 mmol/L (ref 135–145)
Total Bilirubin: 0.6 mg/dL (ref 0.3–1.2)
Total Protein: 7.6 g/dL (ref 6.5–8.1)

## 2016-07-23 LAB — URINALYSIS, ROUTINE W REFLEX MICROSCOPIC
Bilirubin Urine: NEGATIVE
GLUCOSE, UA: NEGATIVE mg/dL
Hgb urine dipstick: NEGATIVE
Ketones, ur: 5 mg/dL — AB
LEUKOCYTES UA: NEGATIVE
Nitrite: NEGATIVE
PROTEIN: NEGATIVE mg/dL
Specific Gravity, Urine: 1.013 (ref 1.005–1.030)
pH: 5 (ref 5.0–8.0)

## 2016-07-23 LAB — CBC
HCT: 37.3 % (ref 36.0–46.0)
Hemoglobin: 13.1 g/dL (ref 12.0–15.0)
MCH: 29.3 pg (ref 26.0–34.0)
MCHC: 35.1 g/dL (ref 30.0–36.0)
MCV: 83.4 fL (ref 78.0–100.0)
PLATELETS: 251 10*3/uL (ref 150–400)
RBC: 4.47 MIL/uL (ref 3.87–5.11)
RDW: 14.5 % (ref 11.5–15.5)
WBC: 19.2 10*3/uL — AB (ref 4.0–10.5)

## 2016-07-23 LAB — INFLUENZA PANEL BY PCR (TYPE A & B)
INFLAPCR: NEGATIVE
INFLBPCR: NEGATIVE

## 2016-07-23 LAB — LIPASE, BLOOD: LIPASE: 17 U/L (ref 11–51)

## 2016-07-23 LAB — RAPID STREP SCREEN (MED CTR MEBANE ONLY): STREPTOCOCCUS, GROUP A SCREEN (DIRECT): NEGATIVE

## 2016-07-23 MED ORDER — ACETAMINOPHEN 325 MG PO TABS
650.0000 mg | ORAL_TABLET | Freq: Once | ORAL | Status: AC
  Administered 2016-07-23: 650 mg via ORAL
  Filled 2016-07-23: qty 2

## 2016-07-23 MED ORDER — ONDANSETRON 4 MG PO TBDP: 4.0000 mg | ORAL_TABLET | Freq: Three times a day (TID) | ORAL | 0 refills | Status: DC | PRN

## 2016-07-23 MED ORDER — SODIUM CHLORIDE 0.9 % IV BOLUS (SEPSIS)
1000.0000 mL | Freq: Once | INTRAVENOUS | Status: AC
  Administered 2016-07-23: 1000 mL via INTRAVENOUS

## 2016-07-23 NOTE — ED Notes (Signed)
Patient transported to X-ray 

## 2016-07-23 NOTE — ED Provider Notes (Signed)
[facility]-EMERGENCY DEPT Provider Note   CSN: [id number] Arrival date & time: 07/23/16  4098     History   Chief Complaint Chief Complaint  Patient presents with  . Fever  . Sore Throat  . Emesis    HPI Dawn Atkinson is a 46 y.o. female.  HPI  46 year old female with a history of asthma, sickle cell trait, and interstitial cystitis presents with fever and sore throat for the last 36 hours. She states that yesterday was worse than this morning. Last night she felt like her throat was very swollen to the point that she could hardly swallow and had voice change. This morning it is better but still painful. Hurts worse with swallowing but also hurts all the time. No trouble handling her secretions or talking this morning. No cough but she freely has to clear her throat. Had vomiting last night. Has been having headaches, bilateral ear pain, and bilateral neck pain that she thinks is coming from her ears. Has been having upper abdominal pain she things might be from vomiting. Has chronic dysuria from the interstitial cystitis, unchanged today. Has been having muscle aches diffusely.  Past Medical History:  Diagnosis Date  . Asthma   . Endometriosis   . Headache(784.0)   . Heart murmur   . Hypertension   . Interstitial cystitis   . Sickle cell trait (HCC)   . Vitamin D insufficiency     Patient Active Problem List   Diagnosis Date Noted  . Asthma 10/18/2010  . H/O: hysterectomy 10/18/2010  . Cystitis, interstitial 10/18/2010  . Constipation 10/18/2010    Past Surgical History:  Procedure Laterality Date  . ABDOMINAL HYSTERECTOMY    . ANKLE SURGERY    . BARTHOLIN GLAND CYST EXCISION    . CYSTECTOMY    . DILATION AND CURETTAGE OF UTERUS    . EYE SURGERY    . INGUINAL HERNIA REPAIR    . LAPAROSCOPY      OB History    Gravida Para Term Preterm AB Living   SAB TAB Ectopic Multiple Live Births   Home Medications    Prior to Admission  medications   Medication Sig Start Date End Date Taking? Authorizing Provider  albuterol (PROVENTIL HFA;VENTOLIN HFA) 108 (90 BASE) MCG/ACT inhaler Inhale 2 puffs into the lungs every 6 (six) hours as needed for wheezing or shortness of breath. 03/07/15  Yes Samantha Tripp Dowless, PA-C  albuterol (PROVENTIL) (2.5 MG/3ML) 0.083% nebulizer solution Take 3 mLs (2.5 mg total) by nebulization every 6 (six) hours as needed for wheezing or shortness of breath. 03/07/15  Yes Samantha Tripp Dowless, PA-C  B Complex-Folic Acid (B COMPLEX-VITAMIN B12 PO) Take 1 tablet by mouth daily.   Yes Historical Provider, MD  calcium-vitamin D (OSCAL WITH D) 500-200 MG-UNIT tablet Take 1 tablet by mouth daily with breakfast.   Yes Historical Provider, MD  cetirizine (ZYRTEC) 10 MG tablet Take 10 mg by mouth daily. For allergies   Yes Historical Provider, MD  co-enzyme Q-10 30 MG capsule Take 30 mg by mouth daily.   Yes Historical Provider, MD  [facility] Liver Oil CAPS Take 1 capsule by mouth daily.   Yes Historical Provider, MD  docusate sodium (COLACE) 100 MG capsule Take 100 mg by mouth daily as needed for mild constipation.   Yes Historical Provider, MD  FLUoxetine (PROZAC) 10 MG  tablet Take 10 mg by mouth daily.   Yes Historical Provider, MD  losartan (COZAAR) 100 MG tablet Take 100 mg by mouth daily.   Yes Historical Provider, MD  mometasone-formoterol (DULERA) 100-5 MCG/ACT AERO Inhale 1 puff into the lungs daily as needed for wheezing or shortness of breath.   Yes Historical Provider, MD  triamcinolone cream (KENALOG) 0.1 % Apply 1 application topically 3 (three) times daily as needed (eczema.).    Yes Historical Provider, MD  VITAMIN D, ERGOCALCIFEROL, PO Take 5,000 Units by mouth daily.   Yes Historical Provider, MD  VITAMIN E PO Take 1 capsule by mouth every other day.   Yes Historical Provider, MD  docusate sodium (COLACE) 100 MG capsule Take 1 capsule (100 mg total) by mouth 2 (two) times daily as needed. Patient  not taking: Reported on 07/23/2016 04/16/16   Catalina Antigua, MD  HYDROmorphone (DILAUDID) 2 MG tablet Take 1 tablet (2 mg total) by mouth every 3 (three) hours as needed for moderate pain or severe pain. Patient not taking: Reported on 07/23/2016 04/16/16   Catalina Antigua, MD  nitrofurantoin, macrocrystal-monohydrate, (MACROBID) 100 MG capsule Take 1 capsule (100 mg total) by mouth 2 (two) times daily. Patient not taking: Reported on 07/23/2016 04/21/16   Judeth Horn, NP  ondansetron (ZOFRAN ODT) 4 MG disintegrating tablet Take 1 tablet (4 mg total) by mouth every 8 (eight) hours as needed for nausea or vomiting. 07/23/16   Pricilla Loveless, MD    Family History Family History  Problem Relation Age of Onset  . Hypertension Mother   . Fibromyalgia Mother   . Hypertension Father   . Vitamin D deficiency Father   . Alzheimer's disease Maternal Grandmother   . Prostate cancer Maternal Grandfather   . Cirrhosis Paternal Grandmother   . Hypertension Paternal Grandmother   . Pneumonia Paternal Grandfather   . Hypertension Paternal Grandfather   . GER disease Brother   . Hypertension Sister   . Anxiety disorder Sister   . Asthma Child   . Pulmonary Hypertension Child   . Gout Child     Social History Social History  Substance Use Topics  . Smoking status: Current Some Day Smoker    Packs/day: 0.25  . Smokeless tobacco: Never Used  . Alcohol use No     Allergies   Avelox [moxifloxacin hydrochloride]; Amoxicillin-pot clavulanate; Aspirin; Erythromycin; Gabapentin; Lisinopril; Oxycodone; Sulfa antibiotics; and Tessalon [benzonatate]   Review of Systems Review of Systems  Constitutional: Positive for chills and fever.  HENT: Positive for congestion, ear pain and sore throat.   Respiratory: Negative for cough and shortness of breath.   Gastrointestinal: Positive for abdominal pain and vomiting.  Genitourinary: Positive for dysuria (chronic, unchanged).  Musculoskeletal: Positive for neck  pain.  Neurological: Positive for headaches.  All other systems reviewed and are negative.    Physical Exam Updated Vital Signs BP 130/70 (BP Location: Left Arm)   Pulse 73   Temp 98.7 F (37.1 C) (Oral)   Resp 16   Ht  (1.676 m)   Wt 180 lb (81.6 kg)   SpO2 100%   BMI 29.05 kg/m   Physical Exam  Constitutional: She is oriented to person, place, and time. She appears well-developed and well-nourished. No distress.  HENT:  Head: Normocephalic and atraumatic.  Right Ear: External ear normal.  Left Ear: External ear normal.  Nose: Nose normal.  Mouth/Throat: Uvula is midline. No uvula swelling. No oropharyngeal exudate, posterior oropharyngeal edema or tonsillar abscesses.  Tonsils are 2+ on the right. Tonsils are 2+ on the left.  Speaks in clear sentences, no stridor  Eyes: Right eye exhibits no discharge. Left eye exhibits no discharge.  Neck: Normal range of motion. Neck supple.  Cardiovascular: Normal rate, regular rhythm and normal heart sounds.   Pulmonary/Chest: Effort normal and breath sounds normal. She has no wheezes. She has no rales.  Abdominal: Soft. She exhibits no distension. There is no tenderness.  Lymphadenopathy:    She has no cervical adenopathy.  Neurological: She is alert and oriented to person, place, and time.  Skin: Skin is warm and dry. She is not diaphoretic.  Nursing note and vitals reviewed.    ED Treatments / Results  Labs (all labs ordered are listed, but only abnormal results are displayed) Labs Reviewed  COMPREHENSIVE METABOLIC PANEL - Abnormal; Notable for the following:       Result Value   Glucose, Bld 121 (*)    All other components within normal limits  CBC - Abnormal; Notable for the following:    WBC 19.2 (*)    All other components within normal limits  URINALYSIS, ROUTINE W REFLEX MICROSCOPIC - Abnormal; Notable for the following:    Ketones, ur 5 (*)    All other components within normal limits  RAPID STREP SCREEN (NOT  AT [facility])  CULTURE, GROUP A STREP ([facility])  LIPASE, BLOOD  INFLUENZA PANEL BY PCR (TYPE A & B)    EKG  EKG Interpretation None       Radiology Dg Chest 2 View  Result Date: 07/23/2016 CLINICAL DATA:  Cough, chest congestion for the past 2-3 days with onset of fever yesterday. History of asthma, occasional smoker, sickle cell trait. EXAM: CHEST  2 VIEW COMPARISON:  PA and lateral chest x-ray of March 07, 2015 FINDINGS: The lungs are well-expanded and clear. The heart and pulmonary vascularity are normal. There is calcification in the wall of the aortic arch. The mediastinum is normal in width. The bony thorax is unremarkable. IMPRESSION: There is no pneumonia, CHF, nor other acute cardiopulmonary abnormality. Mild hyperinflation likely reflects known reactive airway disease. Thoracic aortic atherosclerosis. Electronically Signed   By: David  [place] M.D.   On: 07/23/2016 08:34    Procedures Procedures (including critical care time)  Medications Ordered in ED Medications  sodium chloride 0.9 % bolus 1,000 mL (0 mLs Intravenous Stopped 07/23/16 0954)  acetaminophen (TYLENOL) tablet 650 mg (650 mg Oral Given 07/23/16 0830)     Initial Impression / Assessment and Plan / ED Course  I have reviewed the triage vital signs and the nursing notes.  Pertinent labs & imaging results that were available during my care of the patient were reviewed by me and considered in my medical decision making (see chart for details).  Clinical Course as of Jul 23 1036  Thu Jul 23, 2016  1610 Will evaluate for influenza given her symptoms and co morbidities. Most likely this is a viral process. Abdominal exam benign. Labs sent from triage. Tylenol, fluids.  [SG]    Clinical Course User Index [SG] Pricilla Loveless, MD    Patient feels better. She overall appears well. This is most consistent with an acute viral respiratory infection. Will give Zofran as needed for nausea. Tylenol and ibuprofen for pain and  chills. No signs of a bacterial illness at this time. There is no significant swelling in her oropharynx and I think this is most likely viral in resolve on its own. No evidence of  abscess. Highly doubt deep space neck infection. Discussed return precautions.  Final Clinical Impressions(s) / ED Diagnoses   Final diagnoses:  Viral upper respiratory infection    New Prescriptions New Prescriptions   ONDANSETRON (ZOFRAN ODT) 4 MG DISINTEGRATING TABLET    Take 1 tablet (4 mg total) by mouth every 8 (eight) hours as needed for nausea or vomiting.     Pricilla Loveless, MD 07/23/16 [phone]

## 2016-07-23 NOTE — ED Notes (Signed)
ED Provider at bedside. 

## 2016-07-23 NOTE — ED Triage Notes (Signed)
Patient c/o fever yesterday of 103, sore throat, body aches and vomiting x couple days.

## 2016-07-25 LAB — CULTURE, GROUP A STREP (THRC)

## 2016-07-30 ENCOUNTER — Emergency Department (HOSPITAL_COMMUNITY)
Admission: EM | Admit: 2016-07-30 | Discharge: 2016-07-31 | Disposition: A | Discharge status: Home / Self Care | Payer: Managed Care, Other (non HMO) | Attending: Emergency Medicine | Admitting: Emergency Medicine

## 2016-07-30 ENCOUNTER — Emergency Department (HOSPITAL_COMMUNITY): Payer: Managed Care, Other (non HMO)

## 2016-07-30 ENCOUNTER — Encounter (HOSPITAL_COMMUNITY): Payer: Self-pay | Admitting: Vascular Surgery

## 2016-07-30 DIAGNOSIS — J45909 Unspecified asthma, uncomplicated: Secondary | ICD-10-CM | POA: Diagnosis not present

## 2016-07-30 DIAGNOSIS — I10 Essential (primary) hypertension: Secondary | ICD-10-CM | POA: Diagnosis not present

## 2016-07-30 DIAGNOSIS — R0600 Dyspnea, unspecified: Secondary | ICD-10-CM | POA: Diagnosis not present

## 2016-07-30 DIAGNOSIS — Y638 Failure in dosage during other surgical and medical care: Secondary | ICD-10-CM | POA: Insufficient documentation

## 2016-07-30 DIAGNOSIS — T360X5A Adverse effect of penicillins, initial encounter: Secondary | ICD-10-CM | POA: Insufficient documentation

## 2016-07-30 DIAGNOSIS — T7840XA Allergy, unspecified, initial encounter: Secondary | ICD-10-CM

## 2016-07-30 DIAGNOSIS — F17200 Nicotine dependence, unspecified, uncomplicated: Secondary | ICD-10-CM | POA: Insufficient documentation

## 2016-07-30 DIAGNOSIS — R05 Cough: Secondary | ICD-10-CM | POA: Diagnosis not present

## 2016-07-30 LAB — BASIC METABOLIC PANEL
ANION GAP: 9 (ref 5–15)
BUN: 8 mg/dL (ref 6–20)
CALCIUM: 9.5 mg/dL (ref 8.9–10.3)
CHLORIDE: 104 mmol/L (ref 101–111)
CO2: 25 mmol/L (ref 22–32)
Creatinine, Ser: 0.7 mg/dL (ref 0.44–1.00)
GFR calc non Af Amer: >60 mL/min (ref >60)
GLUCOSE: 92 mg/dL (ref 65–99)
Potassium: 3.6 mmol/L (ref 3.5–5.1)
Sodium: 138 mmol/L (ref 135–145)

## 2016-07-30 LAB — CBC
HCT: 37.9 % (ref 36.0–46.0)
Hemoglobin: 12.9 g/dL (ref 12.0–15.0)
MCH: 28.5 pg (ref 26.0–34.0)
MCHC: 34 g/dL (ref 30.0–36.0)
MCV: 83.7 fL (ref 78.0–100.0)
Platelets: 377 10*3/uL (ref 150–400)
RBC: 4.53 MIL/uL (ref 3.87–5.11)
RDW: 14 % (ref 11.5–15.5)
WBC: 11.6 10*3/uL — ABNORMAL HIGH (ref 4.0–10.5)

## 2016-07-30 LAB — I-STAT TROPONIN, ED: Troponin i, poc: 0 ng/mL (ref 0.00–0.08)

## 2016-07-30 MED ORDER — METHYLPREDNISOLONE SODIUM SUCC 125 MG IJ SOLR
80.0000 mg | Freq: Once | INTRAMUSCULAR | Status: AC
  Administered 2016-07-31: 80 mg via INTRAVENOUS
  Filled 2016-07-30: qty 2

## 2016-07-30 MED ORDER — KETOTIFEN FUMARATE 0.025 % OP SOLN
1.0000 [drp] | Freq: Two times a day (BID) | OPHTHALMIC | Status: DC
  Administered 2016-07-31: 1 [drp] via OPHTHALMIC
  Filled 2016-07-30: qty 5

## 2016-07-30 MED ORDER — DIPHENHYDRAMINE HCL 50 MG/ML IJ SOLN
25.0000 mg | Freq: Once | INTRAMUSCULAR | Status: AC
  Administered 2016-07-30: 25 mg via INTRAVENOUS
  Filled 2016-07-30: qty 1

## 2016-07-30 NOTE — ED Notes (Signed)
EDP at bedside  

## 2016-07-30 NOTE — ED Provider Notes (Signed)
[facility]-EMERGENCY DEPT Provider Note   CSN: [id number] Arrival date & time: 07/30/16  1945     History   Chief Complaint Chief Complaint  Patient presents with  . Allergic Reaction    HPI Dawn Atkinson is a 46 y.o. female.  The history is provided by the patient and medical records.  Allergic Reaction  Presenting symptoms: difficulty swallowing and swelling   Presenting symptoms: no itching, no rash and no wheezing   Presenting symptoms comment:  Watery eyes, post-nasal drip Severity:  Moderate Duration:  4 days Prior allergic episodes:  Allergies to medications Context: medications   Relieved by:  None tried Ineffective treatments:  None tried   Past Medical History:  Diagnosis Date  . Asthma   . Endometriosis   . Headache(784.0)   . Heart murmur   . Hypertension   . Interstitial cystitis   . Sickle cell trait (HCC)   . Vitamin D insufficiency     Patient Active Problem List   Diagnosis Date Noted  . Asthma 10/18/2010  . H/O: hysterectomy 10/18/2010  . Cystitis, interstitial 10/18/2010  . Constipation 10/18/2010    Past Surgical History:  Procedure Laterality Date  . ABDOMINAL HYSTERECTOMY    . ANKLE SURGERY    . BARTHOLIN GLAND CYST EXCISION    . CYSTECTOMY    . DILATION AND CURETTAGE OF UTERUS    . EYE SURGERY    . INGUINAL HERNIA REPAIR    . LAPAROSCOPY      OB History    Gravida Para Term Preterm AB Living   SAB TAB Ectopic Multiple Live Births   Home Medications    Prior to Admission medications   Medication Sig Start Date End Date Taking? Authorizing Provider  albuterol (PROVENTIL HFA;VENTOLIN HFA) 108 (90 BASE) MCG/ACT inhaler Inhale 2 puffs into the lungs every 6 (six) hours as needed for wheezing or shortness of breath. 03/07/15   Samantha Tripp Dowless, PA-C  albuterol (PROVENTIL) (2.5 MG/3ML) 0.083% nebulizer solution Take 3 mLs (2.5 mg total) by nebulization every 6 (six) hours as needed for wheezing  or shortness of breath. 03/07/15   Samantha Tripp Dowless, PA-C  B Complex-Folic Acid (B COMPLEX-VITAMIN B12 PO) Take 1 tablet by mouth daily.    Historical Provider, MD  calcium-vitamin D (OSCAL WITH D) 500-200 MG-UNIT tablet Take 1 tablet by mouth daily with breakfast.    Historical Provider, MD  cefdinir (OMNICEF) 300 MG capsule Take 2 capsules (600 mg total) by mouth daily. 07/31/16 08/10/16  Forest Becker, MD  cetirizine (ZYRTEC) 10 MG tablet Take 10 mg by mouth daily. For allergies    Historical Provider, MD  co-enzyme Q-10 30 MG capsule Take 30 mg by mouth daily.    Historical Provider, MD  [facility] Liver Oil CAPS Take 1 capsule by mouth daily.    Historical Provider, MD  docusate sodium (COLACE) 100 MG capsule Take 1 capsule (100 mg total) by mouth 2 (two) times daily as needed. Patient not taking: Reported on 07/23/2016 04/16/16   Catalina Antigua, MD  docusate sodium (COLACE) 100 MG capsule Take 100 mg by mouth daily as needed for mild constipation.    Historical Provider, MD  FLUoxetine (PROZAC) 10 MG tablet Take 10 mg by mouth daily.    Historical Provider, MD  HYDROmorphone (DILAUDID) 2 MG tablet Take 1 tablet (2 mg total) by  mouth every 3 (three) hours as needed for moderate pain or severe pain. Patient not taking: Reported on 07/23/2016 04/16/16   Catalina Antigua, MD  losartan (COZAAR) 100 MG tablet Take 100 mg by mouth daily.    Historical Provider, MD  mometasone-formoterol (DULERA) 100-5 MCG/ACT AERO Inhale 1 puff into the lungs daily as needed for wheezing or shortness of breath.    Historical Provider, MD  nitrofurantoin, macrocrystal-monohydrate, (MACROBID) 100 MG capsule Take 1 capsule (100 mg total) by mouth 2 (two) times daily. Patient not taking: Reported on 07/23/2016 04/21/16   Judeth Horn, NP  ondansetron (ZOFRAN ODT) 4 MG disintegrating tablet Take 1 tablet (4 mg total) by mouth every 8 (eight) hours as needed for nausea or vomiting. 07/23/16   Pricilla Loveless, MD  triamcinolone cream  (KENALOG) 0.1 % Apply 1 application topically 3 (three) times daily as needed (eczema.).     Historical Provider, MD  VITAMIN D, ERGOCALCIFEROL, PO Take 5,000 Units by mouth daily.    Historical Provider, MD  VITAMIN E PO Take 1 capsule by mouth every other day.    Historical Provider, MD    Family History Family History  Problem Relation Age of Onset  . Hypertension Mother   . Fibromyalgia Mother   . Hypertension Father   . Vitamin D deficiency Father   . Alzheimer's disease Maternal Grandmother   . Prostate cancer Maternal Grandfather   . Cirrhosis Paternal Grandmother   . Hypertension Paternal Grandmother   . Pneumonia Paternal Grandfather   . Hypertension Paternal Grandfather   . GER disease Brother   . Hypertension Sister   . Anxiety disorder Sister   . Asthma Child   . Pulmonary Hypertension Child   . Gout Child     Social History Social History  Substance Use Topics  . Smoking status: Current Some Day Smoker    Packs/day: 0.25  . Smokeless tobacco: Never Used  . Alcohol use No     Allergies   Avelox [moxifloxacin hydrochloride]; Amoxicillin-pot clavulanate; Aspirin; Erythromycin; Gabapentin; Lisinopril; Oxycodone; Sulfa antibiotics; and Tessalon [benzonatate]   Review of Systems Review of Systems  Constitutional: Negative for chills and fever.  HENT: Positive for postnasal drip and trouble swallowing. Negative for ear pain and sore throat.   Eyes: Positive for redness and itching. Negative for pain and visual disturbance.  Respiratory: Positive for cough. Negative for shortness of breath, wheezing and stridor.   Cardiovascular: Negative for chest pain and palpitations.  Gastrointestinal: Negative for abdominal pain and vomiting.  Genitourinary: Negative for dysuria and hematuria.  Musculoskeletal: Negative for arthralgias and back pain.  Skin: Negative for color change, itching and rash.  Neurological: Negative for seizures and syncope.  All other systems  reviewed and are negative.    Physical Exam Updated Vital Signs BP (!) 139/95   Pulse 62   Temp 99.2 F (37.3 C) (Oral)   Resp 19   SpO2 98%   Physical Exam  Constitutional: She is oriented to person, place, and time. She appears well-developed and well-nourished. No distress.  HENT:  Head: Normocephalic and atraumatic.  Mouth/Throat: Oropharynx is clear and moist.  Left serous middle ear effusion, no tongue swelilng  Eyes: Conjunctivae are normal.  B/l scleral injection  Neck: Neck supple.  Cardiovascular: Normal rate and regular rhythm.   No murmur heard. Pulmonary/Chest: Effort normal and breath sounds normal. No stridor. No respiratory distress. She has no wheezes.  Dry cough on exam  Abdominal: Soft. There is no tenderness.  Musculoskeletal: She exhibits no edema.  Neurological: She is alert and oriented to person, place, and time.  Skin: Skin is warm and dry.  Psychiatric: She has a normal mood and affect.  Nursing note and vitals reviewed.    ED Treatments / Results  Labs (all labs ordered are listed, but only abnormal results are displayed) Labs Reviewed  CBC - Abnormal; Notable for the following:       Result Value   WBC 11.6 (*)    All other components within normal limits  BASIC METABOLIC PANEL  I-STAT TROPOININ, ED    EKG  EKG Interpretation  Date/Time:  Thursday July 30 2016 20:03:23 EDT Ventricular Rate:  70 PR Interval:  160 QRS Duration: 74 QT Interval:  394 QTC Calculation: 425 R Axis:   66 Text Interpretation:  Normal sinus rhythm Normal ECG No significant change since last tracing Confirmed by Anitra Lauth  MD, Alphonzo Lemmings ([id number]) on 07/30/2016 11:30:54 PM       Radiology Dg Chest 2 View  Result Date: 07/30/2016 CLINICAL DATA:  Chest pain, difficulty swallowing and tongue swelling. EXAM: CHEST  2 VIEW COMPARISON:  07/23/2016 FINDINGS: The cardiac silhouette, mediastinal and hilar contours are normal and stable. The lungs are clear. No  pleural effusion. The bony thorax is intact. IMPRESSION: No acute cardiopulmonary findings. Electronically Signed   By: Rudie Meyer M.D.   On: 07/30/2016 20:37    Procedures Procedures (including critical care time)  Medications Ordered in ED Medications  ketotifen (ZADITOR) 0.025 % ophthalmic solution 1 drop (not administered)  diphenhydrAMINE (BENADRYL) injection 25 mg (25 mg Intravenous Given 07/30/16 2306)  methylPREDNISolone sodium succinate (SOLU-MEDROL) 125 mg/2 mL injection 80 mg (80 mg Intravenous Given 07/31/16 0009)  ketorolac (TORADOL) 15 MG/ML injection 15 mg (15 mg Intravenous Given 07/31/16 0009)     Initial Impression / Assessment and Plan / ED Course  I have reviewed the triage vital signs and the nursing notes.  Pertinent labs & imaging results that were available during my care of the patient were reviewed by me and considered in my medical decision making (see chart for details).    Pt w/multiple medication allergies presents with an allergic reaction. She was seen at [facility] 7days ago & dx'd with a viral URI; she continued to feel ill over the weekend, so she went to her PCP's office on Monday, was dx'd with a left AOM, and dc'd on Amoxicillin. 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 Since taking the first dose of amoxicillin on Monday night, she has complained of red, itchy eyes, post-nasal drip, tongue swelling, and a worsened cough. She has continued to take the antibiotic, and has not taken anything additional for her allergic symptoms.  VS & exam as above. No exam findings concerning for anaphylactic reaction. Labs & CXR unremarkable. Pt likely suffering from a localized reaction to amoxicillin; IV Benadryl, IV Solumedrol, & ketotifen eye drops given in the ED.  Explained all results to the patient. Will discharge the patient home with prescription for cefdinir. Recommending  follow-up with PCP. ED return precautions given. Patient acknowledged understanding of, and concurrence with the plan. All questions answered to her satisfaction. In stable condition at the time of discharge.  Final Clinical Impressions(s) / ED Diagnoses   Final diagnoses:  Allergic reaction, initial encounter    New Prescriptions New Prescriptions   CEFDINIR (OMNICEF) 300 MG CAPSULE    Take  2 capsules (600 mg total) by mouth daily.     Forest Becker, MD 07/31/16 1610    Gwyneth Sprout, MD 08/01/16 [phone]

## 2016-07-30 NOTE — ED Triage Notes (Signed)
Pt reports to the ED for eval of bilateral conjunctival  erythema, difficulty swallowing, and tongue swelling. Able to control her secretions. She states that she has a frontal HA and her eyes are irritated and itchy. Denies any new rash or SOB. Airway intact. States that she was recently seen and dx with an ear infection and she was placed on Amoxicillin. She started the abx on Monday. Symptom onset was today. She took some benadryl without relief in her symptoms.

## 2016-07-30 NOTE — ED Notes (Signed)
Plunkett MD at bedside. 

## 2016-07-30 NOTE — ED Triage Notes (Addendum)
Added that her chest feels tight and sore. Pain is worse with coughing. She has hx of asthma. No wheezing noted.

## 2016-07-31 MED ORDER — KETOROLAC TROMETHAMINE 15 MG/ML IJ SOLN
15.0000 mg | Freq: Once | INTRAMUSCULAR | Status: AC
  Administered 2016-07-31: 15 mg via INTRAVENOUS
  Filled 2016-07-31: qty 1

## 2016-07-31 MED ORDER — CEFDINIR 300 MG PO CAPS: 600.0000 mg | ORAL_CAPSULE | Freq: Every day | ORAL | 0 refills | Status: AC

## 2016-07-31 NOTE — ED Provider Notes (Deleted)
I saw and evaluated the patient, reviewed the resident's note and I agree with the findings and plan.   EKG Interpretation  Date/Time:  Thursday July 30 2016 20:03:23 EDT Ventricular Rate:  70 PR Interval:  160 QRS Duration: 74 QT Interval:  394 QTC Calculation: 425 R Axis:   66 Text Interpretation:  Normal sinus rhythm Normal ECG No significant change since last tracing Confirmed by Anitra Lauth  MD, Alphonzo Lemmings ([id number]) on 07/30/2016 11:30:54 PM      Patient presenting with symptoms concerning for allergic reaction. She was diagnosed with an otitis media and was started on amoxicillin however she has a history of allergy to Augmentin. She is complaining since taking the antibiotic of feelings of her tongue swelling, difficulty swallowing, eye irritation and burning on exam patient has bilateral conjunctival injection, left purulence behind the TM, no significant uvular or throat swelling. No wheezing at this time. Patient given Benadryl and steroids for concern for allergic reaction. Will treat with Keflex.     Dawn Sprout, MD 07/31/16 0020

## 2016-09-21 ENCOUNTER — Other Ambulatory Visit: Payer: Self-pay | Admitting: Physician Assistant

## 2016-09-21 ENCOUNTER — Other Ambulatory Visit (HOSPITAL_COMMUNITY)
Admission: RE | Admit: 2016-09-21 | Discharge: 2016-09-21 | Disposition: A | Discharge status: Home / Self Care | Payer: Managed Care, Other (non HMO) | Source: Ambulatory Visit | Attending: Physician Assistant | Admitting: Physician Assistant

## 2016-09-21 DIAGNOSIS — Z124 Encounter for screening for malignant neoplasm of cervix: Secondary | ICD-10-CM | POA: Diagnosis not present

## 2016-09-24 LAB — CYTOLOGY - PAP
BACTERIAL VAGINITIS: POSITIVE — AB
CANDIDA VAGINITIS: NEGATIVE
DIAGNOSIS: NEGATIVE
HPV: NOT DETECTED
TRICH (WINDOWPATH): NEGATIVE

## 2016-10-31 ENCOUNTER — Other Ambulatory Visit: Payer: Self-pay | Admitting: Student

## 2016-10-31 DIAGNOSIS — N3000 Acute cystitis without hematuria: Secondary | ICD-10-CM

## 2016-11-18 ENCOUNTER — Emergency Department (HOSPITAL_COMMUNITY): Payer: Managed Care, Other (non HMO)

## 2016-11-18 ENCOUNTER — Encounter (HOSPITAL_COMMUNITY): Payer: Self-pay | Admitting: *Deleted

## 2016-11-18 ENCOUNTER — Emergency Department (HOSPITAL_COMMUNITY)
Admission: EM | Admit: 2016-11-18 | Discharge: 2016-11-18 | Disposition: A | Discharge status: Home / Self Care | Payer: Managed Care, Other (non HMO) | Attending: Emergency Medicine | Admitting: Emergency Medicine

## 2016-11-18 DIAGNOSIS — Z79899 Other long term (current) drug therapy: Secondary | ICD-10-CM | POA: Insufficient documentation

## 2016-11-18 DIAGNOSIS — Z880 Allergy status to penicillin: Secondary | ICD-10-CM | POA: Diagnosis not present

## 2016-11-18 DIAGNOSIS — F17200 Nicotine dependence, unspecified, uncomplicated: Secondary | ICD-10-CM | POA: Diagnosis not present

## 2016-11-18 DIAGNOSIS — J45909 Unspecified asthma, uncomplicated: Secondary | ICD-10-CM | POA: Diagnosis not present

## 2016-11-18 DIAGNOSIS — G44209 Tension-type headache, unspecified, not intractable: Secondary | ICD-10-CM | POA: Insufficient documentation

## 2016-11-18 DIAGNOSIS — I159 Secondary hypertension, unspecified: Secondary | ICD-10-CM | POA: Insufficient documentation

## 2016-11-18 DIAGNOSIS — J322 Chronic ethmoidal sinusitis: Secondary | ICD-10-CM | POA: Diagnosis not present

## 2016-11-18 DIAGNOSIS — R51 Headache: Secondary | ICD-10-CM | POA: Diagnosis present

## 2016-11-18 DIAGNOSIS — Z885 Allergy status to narcotic agent status: Secondary | ICD-10-CM | POA: Diagnosis not present

## 2016-11-18 LAB — CBC WITH DIFFERENTIAL/PLATELET
Basophils Absolute: 0 10*3/uL (ref 0.0–0.1)
Basophils Relative: 0 %
Eosinophils Absolute: 0.2 10*3/uL (ref 0.0–0.7)
Eosinophils Relative: 2 %
HCT: 40.5 % (ref 36.0–46.0)
Hemoglobin: 13.6 g/dL (ref 12.0–15.0)
LYMPHS ABS: 3 10*3/uL (ref 0.7–4.0)
Lymphocytes Relative: 34 %
MCH: 27.6 pg (ref 26.0–34.0)
MCHC: 33.6 g/dL (ref 30.0–36.0)
MCV: 82.3 fL (ref 78.0–100.0)
MONO ABS: 0.6 10*3/uL (ref 0.1–1.0)
MONOS PCT: 6 %
Neutro Abs: 5.1 10*3/uL (ref 1.7–7.7)
Neutrophils Relative %: 58 %
PLATELETS: 258 10*3/uL (ref 150–400)
RBC: 4.92 MIL/uL (ref 3.87–5.11)
RDW: 13.8 % (ref 11.5–15.5)
WBC: 8.8 10*3/uL (ref 4.0–10.5)

## 2016-11-18 LAB — I-STAT CHEM 8, ED
BUN: 10 mg/dL (ref 6–20)
CALCIUM ION: 1.19 mmol/L (ref 1.15–1.40)
Chloride: 104 mmol/L (ref 101–111)
Creatinine, Ser: 0.6 mg/dL (ref 0.44–1.00)
GLUCOSE: 96 mg/dL (ref 65–99)
HCT: 41 % (ref 36.0–46.0)
Hemoglobin: 13.9 g/dL (ref 12.0–15.0)
Potassium: 3.7 mmol/L (ref 3.5–5.1)
Sodium: 141 mmol/L (ref 135–145)
TCO2: 30 mmol/L (ref 0–100)

## 2016-11-18 MED ORDER — KETOROLAC TROMETHAMINE 60 MG/2ML IM SOLN
30.0000 mg | Freq: Once | INTRAMUSCULAR | Status: AC
Start: 2016-11-18 — End: 2016-11-18
  Administered 2016-11-18: 30 mg via INTRAMUSCULAR
  Filled 2016-11-18: qty 2

## 2016-11-18 NOTE — ED Notes (Signed)
attempted blood draw. Unsucessful

## 2016-11-18 NOTE — ED Notes (Signed)
ED Provider at bedside. 

## 2016-11-18 NOTE — Discharge Instructions (Signed)
Take flonase daily. Continue tylenol and motrin for pain. Follow up with family doctor if not improving. Also discuss possible sleep study with your doctor

## 2016-11-18 NOTE — ED Triage Notes (Signed)
Pt reports having headache to top of her head x 2 weeks and having difficulty concentrating in class. Hx of HTN, reports taking her meds as prescribed. Has taken meds today, HTN noted at triage. No neuro deficits noted.

## 2016-11-18 NOTE — ED Provider Notes (Signed)
[facility]-EMERGENCY DEPT Provider Note   CSN: [id number] Arrival date & time: 11/18/16  1156     History   Chief Complaint Chief Complaint  Patient presents with  . Headache    HPI Dawn SorrowShawana P Quest is a 46 y.o. female.  HPI Dawn Atkinson is a 46 y.o. female history of hypertension, asthma, vitamin D deficiency, presents to emergency department complaining of a headache and generalized weakness. Patient states she has had headache to the top of her head for the last 2 weeks. States headache onset is gradual, pain is constant, dull. She states it hurts worse at nighttime and she is unable to sleep. She reports pain radiating into the neck. She states she feels more fatigued than usual and states she is sleeping more than normal. She states that she works third shift and then sleeps until evening time all day long. She states she recently went to an ophthalmologist and had her vision checked and that looks normal. She reports some difficulty with word finding and focusing while in class. She denies any recent change in medications. She denies any drugs or alcohol. She reports "squiggly lines of black dots" in her vision. She does have history of migraine headaches but states these feel different. She states "my scalp is tender." Denies any rashes. Denies any tight hair styles. She has taken ibuprofen for pain at home with no improvement. Her boyfriend does state that patient snores and stops breathing and mild the night very frequently.  Past Medical History:  Diagnosis Date  . Asthma   . Endometriosis   . Headache(784.0)   . Heart murmur   . Hypertension   . Interstitial cystitis   . Sickle cell trait (HCC)   . Vitamin D insufficiency     Patient Active Problem List   Diagnosis Date Noted  . Asthma 10/18/2010  . H/O: hysterectomy 10/18/2010  . Cystitis, interstitial 10/18/2010  . Constipation 10/18/2010    Past Surgical History:  Procedure Laterality Date  . ABDOMINAL HYSTERECTOMY     . ANKLE SURGERY    . BARTHOLIN GLAND CYST EXCISION    . CYSTECTOMY    . DILATION AND CURETTAGE OF UTERUS    . EYE SURGERY    . INGUINAL HERNIA REPAIR    . LAPAROSCOPY      OB History    Gravida Para Term Preterm AB Living   3 1     2 1    SAB TAB Ectopic Multiple Live Births   1 1     1        Home Medications    Prior to Admission medications   Medication Sig Start Date End Date Taking? Authorizing Provider  albuterol (PROVENTIL HFA;VENTOLIN HFA) 108 (90 BASE) MCG/ACT inhaler Inhale 2 puffs into the lungs every 6 (six) hours as needed for wheezing or shortness of breath. 03/07/15   Dowless, Samantha Tripp, PA-C  albuterol (PROVENTIL) (2.5 MG/3ML) 0.083% nebulizer solution Take 3 mLs (2.5 mg total) by nebulization every 6 (six) hours as needed for wheezing or shortness of breath. 03/07/15   Dowless, Samantha Tripp, PA-C  B Complex-Folic Acid (B COMPLEX-VITAMIN B12 PO) Take 1 tablet by mouth daily.    [provider]  calcium-vitamin D (OSCAL WITH D) 500-200 MG-UNIT tablet Take 1 tablet by mouth daily with breakfast.    [provider]  cetirizine (ZYRTEC) 10 MG tablet Take 10 mg by mouth daily. For allergies    [provider]  co-enzyme Q-10 30 MG  capsule Take 30 mg by mouth daily.    [provider]  [facility] Liver Oil CAPS Take 1 capsule by mouth daily.    [provider]  docusate sodium (COLACE) 100 MG capsule Take 1 capsule (100 mg total) by mouth 2 (two) times daily as needed. Patient not taking: Reported on 07/23/2016 04/16/16   Constant, Peggy, MD  docusate sodium (COLACE) 100 MG capsule Take 100 mg by mouth daily as needed for mild constipation.    [provider]  FLUoxetine (PROZAC) 10 MG tablet Take 10 mg by mouth daily.    [provider]  HYDROmorphone (DILAUDID) 2 MG tablet Take 1 tablet (2 mg total) by mouth every 3 (three) hours as needed for moderate pain or severe pain. Patient not taking: Reported on  07/23/2016 04/16/16   Constant, Peggy, MD  losartan (COZAAR) 100 MG tablet Take 100 mg by mouth daily.    [provider]  mometasone-formoterol (DULERA) 100-5 MCG/ACT AERO Inhale 1 puff into the lungs daily as needed for wheezing or shortness of breath.    [provider]  nitrofurantoin, macrocrystal-monohydrate, (MACROBID) 100 MG capsule Take 1 capsule (100 mg total) by mouth 2 (two) times daily. Patient not taking: Reported on 07/23/2016 04/21/16   Judeth Horn, NP  ondansetron (ZOFRAN ODT) 4 MG disintegrating tablet Take 1 tablet (4 mg total) by mouth every 8 (eight) hours as needed for nausea or vomiting. 07/23/16   Pricilla Loveless, MD  triamcinolone cream (KENALOG) 0.1 % Apply 1 application topically 3 (three) times daily as needed (eczema.).     [provider]  VITAMIN D, ERGOCALCIFEROL, PO Take 5,000 Units by mouth daily.    [provider]  VITAMIN E PO Take 1 capsule by mouth every other day.    [provider]    Family History Family History  Problem Relation Age of Onset  . Hypertension Mother   . Fibromyalgia Mother   . Hypertension Father   . Vitamin D deficiency Father   . Alzheimer's disease Maternal Grandmother   . Prostate cancer Maternal Grandfather   . Cirrhosis Paternal Grandmother   . Hypertension Paternal Grandmother   . Pneumonia Paternal Grandfather   . Hypertension Paternal Grandfather   . GER disease Brother   . Hypertension Sister   . Anxiety disorder Sister   . Asthma Child   . Pulmonary Hypertension Child   . Gout Child     Social History Social History  Substance Use Topics  . Smoking status: Current Some Day Smoker    Packs/day: 0.25  . Smokeless tobacco: Never Used  . Alcohol use No     Allergies   Avelox [moxifloxacin hydrochloride]; Amoxicillin-pot clavulanate; Aspirin; Erythromycin; Gabapentin; Lisinopril; Oxycodone; Sulfa antibiotics; and Tessalon [benzonatate]   Review of Systems Review of  Systems  Constitutional: Positive for fatigue. Negative for chills and fever.  Eyes: Negative for pain and visual disturbance.  Respiratory: Negative for cough, chest tightness and shortness of breath.   Cardiovascular: Negative for chest pain, palpitations and leg swelling.  Gastrointestinal: Negative for abdominal pain, diarrhea, nausea and vomiting.  Genitourinary: Negative for dysuria, flank pain, pelvic pain, vaginal bleeding, vaginal discharge and vaginal pain.  Musculoskeletal: Negative for arthralgias, myalgias, neck pain and neck stiffness.  Skin: Negative for rash.  Neurological: Positive for weakness and headaches. Negative for dizziness, speech difficulty and numbness.  All other systems reviewed and are negative.    Physical Exam Updated Vital Signs BP (!) 168/102 (BP Location:  Left Arm)   Pulse 61   Temp 98.2 F (36.8 C) (Oral)   Resp 16   SpO2 100%   Physical Exam  Constitutional: She is oriented to person, place, and time. She appears well-developed and well-nourished. No distress.  HENT:  Head: Normocephalic.  Right Ear: External ear normal.  Left Ear: External ear normal.  TMs normal bilaterally  Eyes: Pupils are equal, round, and reactive to light. Conjunctivae and EOM are normal.  Neck: Normal range of motion. Neck supple.  Cardiovascular: Normal rate, regular rhythm and normal heart sounds.   Pulmonary/Chest: Effort normal and breath sounds normal. No respiratory distress. She has no wheezes. She has no rales.  Abdominal: Soft. Bowel sounds are normal. She exhibits no distension. There is no tenderness. There is no rebound.  Musculoskeletal: She exhibits no edema.  Neurological: She is alert and oriented to person, place, and time.  5/5 and equal upper and lower extremity strength bilaterally. Equal grip strength bilaterally. Normal finger to nose and heel to shin. No pronator drift. Gait is normal  Skin: Skin is warm and dry.  Psychiatric: She has a normal  mood and affect. Her behavior is normal.  Nursing note and vitals reviewed.    ED Treatments / Results  Labs (all labs ordered are listed, but only abnormal results are displayed) Labs Reviewed  CBC WITH DIFFERENTIAL/PLATELET  I-STAT CHEM 8, ED    EKG  EKG Interpretation None       Radiology Ct Head Wo Contrast  Result Date: 11/18/2016 CLINICAL DATA:  Headache.  Sickle cell trait EXAM: CT HEAD WITHOUT CONTRAST TECHNIQUE: Contiguous axial images were obtained from the base of the skull through the vertex without intravenous contrast. COMPARISON:  June 27, 2014 FINDINGS: Brain: The ventricles are normal in size and configuration. Prominence of the cisterna magna is a stable anatomic variant. There is no intracranial mass, hemorrhage, extra-axial fluid collection, or midline shift. Gray-white compartments are normal. No evident acute infarct. Vascular: There is no hyperdense vessel. There is no appreciable arterial vascular calcification. Skull: The bony calvarium appears intact. Sinuses/Orbits: There is slight mucosal thickening in several ethmoid air cells bilaterally. Other visualized paranasal sinuses are clear. There is rightward deviation of the nasal septum. Orbits appear symmetric bilaterally. Other: Mastoid air cells are clear. IMPRESSION: No intracranial mass, hemorrhage, or extra-axial fluid collection. Gray-white compartments appear normal. Mild ethmoid sinus disease. Rightward deviation of nasal septum. Electronically Signed   By: Bretta Bang III M.D.   On: 11/18/2016 15:26    Procedures Procedures (including critical care time)  Medications Ordered in ED Medications  ketorolac (TORADOL) injection 30 mg (not administered)     Initial Impression / Assessment and Plan / ED Course  I have reviewed the triage vital signs and the nursing notes.  Pertinent labs & imaging results that were available during my care of the patient were reviewed by me and considered in my  medical decision making (see chart for details).     Patient in emergency department with nonspecific, constant, dull headache for the last 2 weeks. She has normal neurological exam. She reports associated fatigue and sleepiness. She reports difficulty concentrating in class and having trouble with word finding. Speech is normal while taking her history. We will get labs to rule out anemia, electrolyte disturbance, check renal function. We'll get a CT head. Patient is hypertensive, states she took all her medicines.  3:39 PM Pt's CT is negative other than mild ethmoid sinus disease. Will start  on Flonase. Pt's labs are normal. Discussed results. Pt in NAD. Normal neurological exam. I do not think she needs any further treatment. Will dc home with close outpatient follow up.    Vitals:   11/18/16 1210 11/18/16 1603  BP: (!) 168/102 (!) 155/90  Pulse: 61 60  Resp: 16 18  Temp: 98.2 F (36.8 C)   TempSrc: Oral   SpO2: 100% 100%    Final Clinical Impressions(s) / ED Diagnoses   Final diagnoses:  Acute non intractable tension-type headache  Secondary hypertension  Ethmoid sinusitis, unspecified chronicity    New Prescriptions Discharge Medication List as of 11/18/2016  3:45 PM       Jaynie CrumbleKirichenko, Britiney Blahnik, PA-C 11/18/16 2052    Arby BarrettePfeiffer, Marcy, MD 11/19/16 2352

## 2017-06-27 ENCOUNTER — Other Ambulatory Visit: Payer: Self-pay

## 2017-06-27 ENCOUNTER — Encounter (HOSPITAL_COMMUNITY): Payer: Self-pay

## 2017-06-27 ENCOUNTER — Emergency Department (HOSPITAL_COMMUNITY)
Admission: EM | Admit: 2017-06-27 | Discharge: 2017-06-27 | Disposition: A | Discharge status: Home / Self Care | Payer: Self-pay | Attending: Emergency Medicine | Admitting: Emergency Medicine

## 2017-06-27 ENCOUNTER — Emergency Department (HOSPITAL_COMMUNITY): Payer: Self-pay

## 2017-06-27 DIAGNOSIS — J45909 Unspecified asthma, uncomplicated: Secondary | ICD-10-CM | POA: Insufficient documentation

## 2017-06-27 DIAGNOSIS — R52 Pain, unspecified: Secondary | ICD-10-CM

## 2017-06-27 DIAGNOSIS — R1013 Epigastric pain: Secondary | ICD-10-CM | POA: Insufficient documentation

## 2017-06-27 DIAGNOSIS — Z79899 Other long term (current) drug therapy: Secondary | ICD-10-CM | POA: Insufficient documentation

## 2017-06-27 DIAGNOSIS — F17200 Nicotine dependence, unspecified, uncomplicated: Secondary | ICD-10-CM | POA: Insufficient documentation

## 2017-06-27 DIAGNOSIS — I10 Essential (primary) hypertension: Secondary | ICD-10-CM | POA: Insufficient documentation

## 2017-06-27 DIAGNOSIS — R112 Nausea with vomiting, unspecified: Secondary | ICD-10-CM | POA: Insufficient documentation

## 2017-06-27 LAB — CBC WITH DIFFERENTIAL/PLATELET
BASOS ABS: 0 10*3/uL (ref 0.0–0.1)
BASOS PCT: 0 %
EOS PCT: 1 %
Eosinophils Absolute: 0.1 10*3/uL (ref 0.0–0.7)
HCT: 40.2 % (ref 36.0–46.0)
Hemoglobin: 13.6 g/dL (ref 12.0–15.0)
LYMPHS PCT: 10 %
Lymphs Abs: 0.9 10*3/uL (ref 0.7–4.0)
MCH: 28.8 pg (ref 26.0–34.0)
MCHC: 33.8 g/dL (ref 30.0–36.0)
MCV: 85 fL (ref 78.0–100.0)
Monocytes Absolute: 0.6 10*3/uL (ref 0.1–1.0)
Monocytes Relative: 7 %
NEUTROS ABS: 7.7 10*3/uL (ref 1.7–7.7)
Neutrophils Relative %: 82 %
PLATELETS: 293 10*3/uL (ref 150–400)
RBC: 4.73 MIL/uL (ref 3.87–5.11)
RDW: 14.4 % (ref 11.5–15.5)
WBC: 9.3 10*3/uL (ref 4.0–10.5)

## 2017-06-27 LAB — COMPREHENSIVE METABOLIC PANEL
ALK PHOS: 85 U/L (ref 38–126)
ALT: 17 U/L (ref 14–54)
ANION GAP: 11 (ref 5–15)
AST: 26 U/L (ref 15–41)
Albumin: 4.2 g/dL (ref 3.5–5.0)
BUN: 14 mg/dL (ref 6–20)
CALCIUM: 9.5 mg/dL (ref 8.9–10.3)
CO2: 24 mmol/L (ref 22–32)
Chloride: 107 mmol/L (ref 101–111)
Creatinine, Ser: 0.61 mg/dL (ref 0.44–1.00)
GFR calc Af Amer: >60 mL/min (ref >60)
Glucose, Bld: 107 mg/dL — ABNORMAL HIGH (ref 65–99)
POTASSIUM: 3.8 mmol/L (ref 3.5–5.1)
Sodium: 142 mmol/L (ref 135–145)
TOTAL PROTEIN: 7.4 g/dL (ref 6.5–8.1)
Total Bilirubin: 0.5 mg/dL (ref 0.3–1.2)

## 2017-06-27 LAB — URINALYSIS, ROUTINE W REFLEX MICROSCOPIC
Bilirubin Urine: NEGATIVE
GLUCOSE, UA: NEGATIVE mg/dL
Hgb urine dipstick: NEGATIVE
KETONES UR: NEGATIVE mg/dL
LEUKOCYTES UA: NEGATIVE
NITRITE: NEGATIVE
PROTEIN: NEGATIVE mg/dL
Specific Gravity, Urine: 1.015 (ref 1.005–1.030)
pH: 5 (ref 5.0–8.0)

## 2017-06-27 LAB — LIPASE, BLOOD: LIPASE: 28 U/L (ref 11–51)

## 2017-06-27 MED ORDER — SODIUM CHLORIDE 0.9 % IV BOLUS (SEPSIS)
1000.0000 mL | Freq: Once | INTRAVENOUS | Status: AC
  Administered 2017-06-27: 1000 mL via INTRAVENOUS

## 2017-06-27 MED ORDER — FAMOTIDINE IN NACL 20-0.9 MG/50ML-% IV SOLN
20.0000 mg | Freq: Once | INTRAVENOUS | Status: AC
  Administered 2017-06-27: 20 mg via INTRAVENOUS
  Filled 2017-06-27: qty 50

## 2017-06-27 MED ORDER — ONDANSETRON HCL 4 MG/2ML IJ SOLN
4.0000 mg | Freq: Once | INTRAMUSCULAR | Status: AC
  Administered 2017-06-27: 4 mg via INTRAVENOUS
  Filled 2017-06-27: qty 2

## 2017-06-27 MED ORDER — FAMOTIDINE 20 MG PO TABS: 20.0000 mg | ORAL_TABLET | Freq: Two times a day (BID) | ORAL | 0 refills | Status: DC

## 2017-06-27 MED ORDER — ONDANSETRON 4 MG PO TBDP: 4.0000 mg | ORAL_TABLET | Freq: Three times a day (TID) | ORAL | 0 refills | Status: DC | PRN

## 2017-06-27 NOTE — ED Provider Notes (Signed)
[facility]-EMERGENCY DEPT Provider Note   CSN: [id number] Arrival date & time: 06/27/17  4098     History   Chief Complaint Chief Complaint  Patient presents with  . Abdominal Pain    HPI Dawn Atkinson is a 47 y.o. female.  Pt presents to the ED today with abdominal pain and n/v.  She developed epigastric and RUQ pain last night.  She took tums which did not seem to help.  Pt said she woke up this morning around 0300 with vomiting.  She still has the pain and it is radiating into her back.  The pt tried to go to work this morning, but continues to vomit.       Past Medical History:  Diagnosis Date  . Asthma   . Endometriosis   . Headache(784.0)   . Heart murmur   . Hypertension   . Interstitial cystitis   . Sickle cell trait (HCC)   . Vitamin D insufficiency     Patient Active Problem List   Diagnosis Date Noted  . Asthma 10/18/2010  . H/O: hysterectomy 10/18/2010  . Cystitis, interstitial 10/18/2010  . Constipation 10/18/2010    Past Surgical History:  Procedure Laterality Date  . ABDOMINAL HYSTERECTOMY    . ANKLE SURGERY    . BARTHOLIN GLAND CYST EXCISION    . CYSTECTOMY    . DILATION AND CURETTAGE OF UTERUS    . EYE SURGERY    . INGUINAL HERNIA REPAIR    . LAPAROSCOPY      OB History    Gravida Para Term Preterm AB Living   3 1     2 1    SAB TAB Ectopic Multiple Live Births   1 1     1        Home Medications    Prior to Admission medications   Medication Sig Start Date End Date Taking? Authorizing Provider  calcium carbonate (TUMS - DOSED IN MG ELEMENTAL CALCIUM) 500 MG chewable tablet Chew 3 tablets by mouth daily as needed for indigestion (Nausea and gastric pain).   Yes [provider]  cetirizine (ZYRTEC) 10 MG tablet Take 10 mg by mouth daily. For allergies   Yes [provider]  co-enzyme Q-10 30 MG capsule Take 30 mg by mouth daily.   Yes [provider]  [facility] Liver Oil CAPS Take 1  capsule by mouth daily.   Yes [provider]  docusate sodium (COLACE) 100 MG capsule Take 100 mg by mouth daily as needed for mild constipation.   Yes [provider]  losartan (COZAAR) 100 MG tablet Take 100 mg by mouth daily.   Yes [provider]  triamcinolone cream (KENALOG) 0.1 % Apply 1 application topically 3 (three) times daily as needed (eczema.).    Yes [provider]  VITAMIN D, ERGOCALCIFEROL, PO Take 5,000 Units by mouth daily.   Yes [provider]  VITAMIN E PO Take 1 capsule by mouth every other day.   Yes [provider]  albuterol (PROVENTIL HFA;VENTOLIN HFA) 108 (90 BASE) MCG/ACT inhaler Inhale 2 puffs into the lungs every 6 (six) hours as needed for wheezing or shortness of breath. 03/07/15   Dowless, Samantha Tripp, PA-C  albuterol (PROVENTIL) (2.5 MG/3ML) 0.083% nebulizer solution Take 3 mLs (2.5 mg total) by nebulization every 6 (six) hours as needed for wheezing or shortness of breath. 03/07/15   Dowless, Lelon Mast Tripp, PA-C  famotidine (PEPCID) 20 MG tablet Take 1 tablet (20  mg total) by mouth 2 (two) times daily. 06/27/17   Jacalyn Lefevre, MD  ondansetron (ZOFRAN ODT) 4 MG disintegrating tablet Take 1 tablet (4 mg total) by mouth every 8 (eight) hours as needed. 06/27/17   Jacalyn Lefevre, MD    Family History Family History  Problem Relation Age of Onset  . Hypertension Mother   . Fibromyalgia Mother   . Hypertension Father   . Vitamin D deficiency Father   . Alzheimer's disease Maternal Grandmother   . Prostate cancer Maternal Grandfather   . Cirrhosis Paternal Grandmother   . Hypertension Paternal Grandmother   . Pneumonia Paternal Grandfather   . Hypertension Paternal Grandfather   . GER disease Brother   . Hypertension Sister   . Anxiety disorder Sister   . Asthma Child   . Pulmonary Hypertension Child   . Gout Child     Social History Social History   Tobacco Use  . Smoking status: Current  Some Day Smoker    Packs/day: 0.25  . Smokeless tobacco: Never Used  Substance Use Topics  . Alcohol use: No  . Drug use: No     Allergies   Amoxicillin-pot clavulanate; Avelox [moxifloxacin hydrochloride]; Aspirin; Erythromycin; Gabapentin; Lisinopril; Oxycodone; Sulfa antibiotics; and Tessalon [benzonatate]   Review of Systems Review of Systems  Gastrointestinal: Positive for abdominal pain, nausea and vomiting.  All other systems reviewed and are negative.    Physical Exam Updated Vital Signs BP (!) 147/112 (BP Location: Left Arm)   Pulse 82   Temp 98.8 F (37.1 C) (Oral)   Resp 16   Ht 5\' 6"  (1.676 m)   Wt 81.6 kg (180 lb)   SpO2 100%   BMI 29.05 kg/m   Physical Exam  Constitutional: She is oriented to person, place, and time. She appears well-developed and well-nourished.  HENT:  Head: Normocephalic and atraumatic.  Mouth/Throat: Oropharynx is clear and moist.  Eyes: EOM are normal. Pupils are equal, round, and reactive to light.  Cardiovascular: Normal rate, regular rhythm, normal heart sounds and intact distal pulses.  Pulmonary/Chest: Effort normal and breath sounds normal.  Abdominal: Normal appearance and bowel sounds are normal. There is tenderness in the right upper quadrant and epigastric area.  Neurological: She is alert and oriented to person, place, and time.  Skin: Skin is warm and dry. Capillary refill takes less than 2 seconds.  Psychiatric: She has a normal mood and affect. Her behavior is normal.  Nursing note and vitals reviewed.    ED Treatments / Results  Labs (all labs ordered are listed, but only abnormal results are displayed) Labs Reviewed  COMPREHENSIVE METABOLIC PANEL - Abnormal; Notable for the following components:      Result Value   Glucose, Bld 107 (*)    All other components within normal limits  CBC WITH DIFFERENTIAL/PLATELET  URINALYSIS, ROUTINE W REFLEX MICROSCOPIC  LIPASE, BLOOD    EKG  EKG Interpretation None         Radiology US Abdomen Limited Ruq  Result Date: 06/27/2017 CLINICAL DATA:  Right upper quadrant pain since last night, nausea and vomiting. EXAM: ULTRASOUND ABDOMEN LIMITED RIGHT UPPER QUADRANT COMPARISON:  None. FINDINGS: Gallbladder: No gallstones or wall thickening visualized. No sonographic Murphy sign noted by sonographer. Common bile duct: Diameter: 5 mm Liver: No focal lesion identified. Within normal limits in parenchymal echogenicity. Portal vein is patent on color Doppler imaging with normal direction of blood flow towards the liver. IMPRESSION: Normal right upper quadrant ultrasound. Electronically Signed  By: Bary RichardStan  Maynard M.D.   On: 06/27/2017 10:15    Procedures Procedures (including critical care time)  Medications Ordered in ED Medications  ondansetron (ZOFRAN) injection 4 mg (4 mg Intravenous Given 06/27/17 0848)  sodium chloride 0.9 % bolus 1,000 mL (1,000 mLs Intravenous New Bag/Given 06/27/17 0848)  famotidine (PEPCID) IVPB 20 mg premix (20 mg Intravenous New Bag/Given 06/27/17 0848)     Initial Impression / Assessment and Plan / ED Course  I have reviewed the triage vital signs and the nursing notes.  Pertinent labs & imaging results that were available during my care of the patient were reviewed by me and considered in my medical decision making (see chart for details).    Pt is feeling much better.  She is stable for d/c.  Return if worse.  Final Clinical Impressions(s) / ED Diagnoses   Final diagnoses:  Pain  Epigastric pain  Non-intractable vomiting with nausea, unspecified vomiting type    ED Discharge Orders        Ordered    ondansetron (ZOFRAN ODT) 4 MG disintegrating tablet  Every 8 hours PRN     06/27/17 1045    famotidine (PEPCID) 20 MG tablet  2 times daily     06/27/17 1045       Jacalyn LefevreHaviland, Aldon Hengst, MD 06/27/17 1045

## 2017-06-27 NOTE — ED Triage Notes (Signed)
She c/o epigastric discomfort which began last evening. She awoke at ~0300 hours today and vomited a few times. She states the pain persists and "radiates through to my back".

## 2017-08-31 ENCOUNTER — Inpatient Hospital Stay (HOSPITAL_COMMUNITY)
Admission: AD | Admit: 2017-08-31 | Discharge: 2017-08-31 | Disposition: A | Discharge status: Home / Self Care | Payer: PRIVATE HEALTH INSURANCE | Source: Ambulatory Visit | Attending: Obstetrics and Gynecology | Admitting: Obstetrics and Gynecology

## 2017-08-31 ENCOUNTER — Encounter (HOSPITAL_COMMUNITY): Payer: Self-pay

## 2017-08-31 DIAGNOSIS — Z820 Family history of epilepsy and other diseases of the nervous system: Secondary | ICD-10-CM | POA: Insufficient documentation

## 2017-08-31 DIAGNOSIS — Z882 Allergy status to sulfonamides status: Secondary | ICD-10-CM | POA: Insufficient documentation

## 2017-08-31 DIAGNOSIS — Z8269 Family history of other diseases of the musculoskeletal system and connective tissue: Secondary | ICD-10-CM | POA: Diagnosis not present

## 2017-08-31 DIAGNOSIS — Z9889 Other specified postprocedural states: Secondary | ICD-10-CM | POA: Insufficient documentation

## 2017-08-31 DIAGNOSIS — R102 Pelvic and perineal pain: Secondary | ICD-10-CM | POA: Diagnosis not present

## 2017-08-31 DIAGNOSIS — Z8249 Family history of ischemic heart disease and other diseases of the circulatory system: Secondary | ICD-10-CM | POA: Diagnosis not present

## 2017-08-31 DIAGNOSIS — Z825 Family history of asthma and other chronic lower respiratory diseases: Secondary | ICD-10-CM | POA: Insufficient documentation

## 2017-08-31 DIAGNOSIS — D573 Sickle-cell trait: Secondary | ICD-10-CM | POA: Insufficient documentation

## 2017-08-31 DIAGNOSIS — N898 Other specified noninflammatory disorders of vagina: Secondary | ICD-10-CM | POA: Insufficient documentation

## 2017-08-31 DIAGNOSIS — Z8379 Family history of other diseases of the digestive system: Secondary | ICD-10-CM | POA: Diagnosis not present

## 2017-08-31 DIAGNOSIS — Z836 Family history of other diseases of the respiratory system: Secondary | ICD-10-CM | POA: Diagnosis not present

## 2017-08-31 DIAGNOSIS — Z886 Allergy status to analgesic agent status: Secondary | ICD-10-CM | POA: Diagnosis not present

## 2017-08-31 DIAGNOSIS — Z885 Allergy status to narcotic agent status: Secondary | ICD-10-CM | POA: Insufficient documentation

## 2017-08-31 DIAGNOSIS — E559 Vitamin D deficiency, unspecified: Secondary | ICD-10-CM | POA: Diagnosis not present

## 2017-08-31 DIAGNOSIS — Z8349 Family history of other endocrine, nutritional and metabolic diseases: Secondary | ICD-10-CM | POA: Diagnosis not present

## 2017-08-31 DIAGNOSIS — J45909 Unspecified asthma, uncomplicated: Secondary | ICD-10-CM | POA: Diagnosis not present

## 2017-08-31 DIAGNOSIS — Z8042 Family history of malignant neoplasm of prostate: Secondary | ICD-10-CM | POA: Diagnosis not present

## 2017-08-31 DIAGNOSIS — Z79899 Other long term (current) drug therapy: Secondary | ICD-10-CM | POA: Insufficient documentation

## 2017-08-31 DIAGNOSIS — Z880 Allergy status to penicillin: Secondary | ICD-10-CM | POA: Diagnosis not present

## 2017-08-31 DIAGNOSIS — F17210 Nicotine dependence, cigarettes, uncomplicated: Secondary | ICD-10-CM | POA: Insufficient documentation

## 2017-08-31 DIAGNOSIS — R350 Frequency of micturition: Secondary | ICD-10-CM | POA: Diagnosis not present

## 2017-08-31 DIAGNOSIS — N3010 Interstitial cystitis (chronic) without hematuria: Secondary | ICD-10-CM | POA: Diagnosis not present

## 2017-08-31 DIAGNOSIS — R109 Unspecified abdominal pain: Secondary | ICD-10-CM | POA: Insufficient documentation

## 2017-08-31 DIAGNOSIS — Z90710 Acquired absence of both cervix and uterus: Secondary | ICD-10-CM | POA: Diagnosis not present

## 2017-08-31 DIAGNOSIS — I10 Essential (primary) hypertension: Secondary | ICD-10-CM | POA: Diagnosis not present

## 2017-08-31 DIAGNOSIS — Z818 Family history of other mental and behavioral disorders: Secondary | ICD-10-CM | POA: Diagnosis not present

## 2017-08-31 DIAGNOSIS — Z881 Allergy status to other antibiotic agents status: Secondary | ICD-10-CM | POA: Insufficient documentation

## 2017-08-31 DIAGNOSIS — Z888 Allergy status to other drugs, medicaments and biological substances status: Secondary | ICD-10-CM | POA: Insufficient documentation

## 2017-08-31 LAB — URINALYSIS, ROUTINE W REFLEX MICROSCOPIC
BILIRUBIN URINE: NEGATIVE
Bacteria, UA: NONE SEEN
GLUCOSE, UA: NEGATIVE mg/dL
HGB URINE DIPSTICK: NEGATIVE
KETONES UR: NEGATIVE mg/dL
Nitrite: NEGATIVE
PROTEIN: NEGATIVE mg/dL
Specific Gravity, Urine: 1.014 (ref 1.005–1.030)
pH: 5 (ref 5.0–8.0)

## 2017-08-31 LAB — CBC WITH DIFFERENTIAL/PLATELET
Basophils Absolute: 0 10*3/uL (ref 0.0–0.1)
Basophils Relative: 1 %
EOS ABS: 0.3 10*3/uL (ref 0.0–0.7)
Eosinophils Relative: 3 %
HEMATOCRIT: 39.2 % (ref 36.0–46.0)
HEMOGLOBIN: 13.1 g/dL (ref 12.0–15.0)
LYMPHS ABS: 3.3 10*3/uL (ref 0.7–4.0)
LYMPHS PCT: 37 %
MCH: 28.3 pg (ref 26.0–34.0)
MCHC: 33.4 g/dL (ref 30.0–36.0)
MCV: 84.7 fL (ref 78.0–100.0)
MONOS PCT: 4 %
Monocytes Absolute: 0.4 10*3/uL (ref 0.1–1.0)
NEUTROS ABS: 4.9 10*3/uL (ref 1.7–7.7)
NEUTROS PCT: 55 %
Platelets: 283 10*3/uL (ref 150–400)
RBC: 4.63 MIL/uL (ref 3.87–5.11)
RDW: 14.7 % (ref 11.5–15.5)
WBC: 8.8 10*3/uL (ref 4.0–10.5)

## 2017-08-31 LAB — COMPREHENSIVE METABOLIC PANEL
ALK PHOS: 77 U/L (ref 38–126)
ALT: 14 U/L (ref 14–54)
ANION GAP: 12 (ref 5–15)
AST: 20 U/L (ref 15–41)
Albumin: 4.2 g/dL (ref 3.5–5.0)
BILIRUBIN TOTAL: 0.4 mg/dL (ref 0.3–1.2)
BUN: 17 mg/dL (ref 6–20)
CO2: 24 mmol/L (ref 22–32)
Calcium: 9.4 mg/dL (ref 8.9–10.3)
Chloride: 106 mmol/L (ref 101–111)
Creatinine, Ser: 0.75 mg/dL (ref 0.44–1.00)
GFR calc non Af Amer: >60 mL/min (ref >60)
GLUCOSE: 119 mg/dL — AB (ref 65–99)
Potassium: 3.5 mmol/L (ref 3.5–5.1)
SODIUM: 142 mmol/L (ref 135–145)
TOTAL PROTEIN: 7.6 g/dL (ref 6.5–8.1)

## 2017-08-31 LAB — WET PREP, GENITAL
Clue Cells Wet Prep HPF POC: NONE SEEN
Sperm: NONE SEEN
Trich, Wet Prep: NONE SEEN

## 2017-08-31 LAB — HIV ANTIBODY (ROUTINE TESTING W REFLEX): HIV Screen 4th Generation wRfx: NONREACTIVE

## 2017-08-31 MED ORDER — NITROFURANTOIN MONOHYD MACRO 100 MG PO CAPS: 100.0000 mg | ORAL_CAPSULE | Freq: Two times a day (BID) | ORAL | 0 refills | Status: AC

## 2017-08-31 MED ORDER — FLUCONAZOLE 150 MG PO TABS: 150.0000 mg | ORAL_TABLET | Freq: Every day | ORAL | 1 refills | Status: DC

## 2017-08-31 MED ORDER — KETOROLAC TROMETHAMINE 60 MG/2ML IM SOLN
60.0000 mg | INTRAMUSCULAR | Status: AC
  Administered 2017-08-31: 60 mg via INTRAMUSCULAR
  Filled 2017-08-31: qty 2

## 2017-08-31 NOTE — MAU Provider Note (Signed)
History     CSN: [id number]  Arrival date and time: 08/31/17 4098   First Provider Initiated Contact with Patient 08/31/17 [id number]      Chief Complaint  Patient presents with  . Abdominal Pain  . Vaginal Irritation  . Urinary Frequency   HPI  Ms.  Dawn Atkinson is a 47 y.o. year old [id number] non-pregnant female who presents to MAU reporting having lower abdominal pain over the past month, vaginal dryness, vaginal irritation, and urinary frequency. She is sexually active; last SI was 2 wks ago. She denies any VB. She has a h/o hysterectomy in 2008 by Dr. Jackelyn Knife; whom she hasn't seen since then. She also has a h/o interstitial cystitis. She is unsure if this pain is from that or her ovaries. She cam here to be seen, because she "couldn't take the pain any longer and she didn't want to wait until Urgent Care opened at 0800".  Past Medical History:  Diagnosis Date  . Asthma   . Endometriosis   . Headache(784.0)   . Heart murmur   . Hypertension   . Interstitial cystitis   . Sickle cell trait (HCC)   . Vitamin D insufficiency     Past Surgical History:  Procedure Laterality Date  . ABDOMINAL HYSTERECTOMY    . ANKLE SURGERY    . BARTHOLIN GLAND CYST EXCISION    . CYSTECTOMY    . DILATION AND CURETTAGE OF UTERUS    . EYE SURGERY    . INGUINAL HERNIA REPAIR    . LAPAROSCOPY      Family History  Problem Relation Age of Onset  . Hypertension Mother   . Fibromyalgia Mother   . Hypertension Father   . Vitamin D deficiency Father   . Alzheimer's disease Maternal Grandmother   . Prostate cancer Maternal Grandfather   . Cirrhosis Paternal Grandmother   . Hypertension Paternal Grandmother   . Pneumonia Paternal Grandfather   . Hypertension Paternal Grandfather   . GER disease Brother   . Hypertension Sister   . Anxiety disorder Sister   . Asthma Child   . Pulmonary Hypertension Child   . Gout Child     Social History   Tobacco Use  . Smoking status: Current Some Day  Smoker    Packs/day: 0.25  . Smokeless tobacco: Never Used  . Tobacco comment: 1-2 cig  Substance Use Topics  . Alcohol use: No  . Drug use: No    Allergies:  Allergies  Allergen Reactions  . Amoxicillin-Pot Clavulanate Anaphylaxis    Has patient had a PCN reaction causing immediate rash, facial/tongue/throat swelling, SOB or lightheadedness with hypotension: Unknown.  Pt thinks, but cannot confirm rash.  Has patient had a PCN reaction causing severe rash involving mucus membranes or skin necrosis: Unknown Has patient had a PCN reaction that required hospitalization No Has patient had a PCN reaction occurring within the last 10 years: No If all of the above answers are "NO", then may proceed with Cephalosporin use.   . Avelox [Moxifloxacin Hydrochloride] Anaphylaxis  . Aspirin Diarrhea  . Erythromycin Other (See Comments)    Unknown  . Gabapentin     hallucinations   . Lisinopril Other (See Comments)    Induced patient asthma  . Oxycodone Hives  . Sulfa Antibiotics Hives  . Tessalon [Benzonatate] Other (See Comments)    WHOOPING COUGH     Medications Prior to Admission  Medication Sig Dispense Refill Last Dose  . albuterol (PROVENTIL HFA;VENTOLIN HFA)  108 (90 BASE) MCG/ACT inhaler Inhale 2 puffs into the lungs every 6 (six) hours as needed for wheezing or shortness of breath. 1 Inhaler 0 PRN  . albuterol (PROVENTIL) (2.5 MG/3ML) 0.083% nebulizer solution Take 3 mLs (2.5 mg total) by nebulization every 6 (six) hours as needed for wheezing or shortness of breath. 75 mL 12 PRN  . calcium carbonate (TUMS - DOSED IN MG ELEMENTAL CALCIUM) 500 MG chewable tablet Chew 3 tablets by mouth daily as needed for indigestion (Nausea and gastric pain).   06/27/2017 at Unknown time  . cetirizine (ZYRTEC) 10 MG tablet Take 10 mg by mouth daily. For allergies   Past Week at Unknown time  . co-enzyme Q-10 30 MG capsule Take 30 mg by mouth daily.   Past Week at Unknown time  . Cod Liver Oil CAPS  Take 1 capsule by mouth daily.   Past Week at Unknown time  . docusate sodium (COLACE) 100 MG capsule Take 100 mg by mouth daily as needed for mild constipation.   Past Week at Unknown time  . famotidine (PEPCID) 20 MG tablet Take 1 tablet (20 mg total) by mouth 2 (two) times daily. 30 tablet 0   . losartan (COZAAR) 100 MG tablet Take 100 mg by mouth daily.   Past Month at Unknown time  . ondansetron (ZOFRAN ODT) 4 MG disintegrating tablet Take 1 tablet (4 mg total) by mouth every 8 (eight) hours as needed. 10 tablet 0   . triamcinolone cream (KENALOG) 0.1 % Apply 1 application topically 3 (three) times daily as needed (eczema.).    06/27/2017 at Unknown time  . VITAMIN D, ERGOCALCIFEROL, PO Take 5,000 Units by mouth daily.   Past Week at Unknown time  . VITAMIN E PO Take 1 capsule by mouth every other day.   Past Week at Unknown time    Review of Systems  Constitutional: Negative.   HENT: Negative.   Eyes: Negative.   Respiratory: Negative.   Cardiovascular: Negative.   Gastrointestinal: Positive for abdominal pain.  Endocrine: Negative.   Genitourinary: Positive for frequency, pelvic pain and vaginal pain (from dryness & irritation). Negative for dysuria.  Musculoskeletal: Negative.   Skin: Negative.   Allergic/Immunologic: Negative.   Neurological: Negative.   Hematological: Negative.   Psychiatric/Behavioral: Negative.    Physical Exam   Blood pressure (!) 152/92, pulse 69, temperature 98.6 F (37 C), temperature source Oral, resp. rate 18, height  (1.676 m), weight 179 lb (81.2 kg).  Physical Exam  Nursing note and vitals reviewed. Constitutional: She is oriented to person, place, and time. She appears well-developed and well-nourished.  HENT:  Head: Normocephalic and atraumatic.  Eyes: Pupils are equal, round, and reactive to light.  Neck: Normal range of motion.  Cardiovascular: Normal rate, regular rhythm and normal heart sounds.  Respiratory: Effort normal and  breath sounds normal.  GI: Soft. Bowel sounds are normal.  Genitourinary:    Pelvic exam was performed with patient supine. There is tenderness in the vagina.  Genitourinary Comments: Scar tissue just inside introitus on LT side from "Bartholin gland removal at age 10" Uterus: surgically absent, SE: cervix is surgically absent, small amt of thick, white vaginal d/c -- WP, GC/CT done, (+) friability of vaginal cuff, (+) bilateral adnexal tenderness --> R>L  Musculoskeletal: Normal range of motion.  Neurological: She is alert and oriented to person, place, and time.  Skin: Skin is warm and dry.  Psychiatric: She has a normal mood and affect. Her  behavior is normal. Judgment and thought content normal.    MAU Course  Procedures  MDM CCUA UCx CBC w/Diff Wet Prep GC/CT -- pending HIV -- pending  Results for orders placed or performed during the hospital encounter of 08/31/17 (from the past 24 hour(s))  Urinalysis, Routine w reflex microscopic     Status: Abnormal   Collection Time: 08/31/17  8:04 AM  Result Value Ref Range   Color, Urine YELLOW YELLOW   APPearance CLEAR CLEAR   Specific Gravity, Urine 1.014 1.005 - 1.030   pH 5.0 5.0 - 8.0   Glucose, UA NEGATIVE NEGATIVE mg/dL   Hgb urine dipstick NEGATIVE NEGATIVE   Bilirubin Urine NEGATIVE NEGATIVE   Ketones, ur NEGATIVE NEGATIVE mg/dL   Protein, ur NEGATIVE NEGATIVE mg/dL   Nitrite NEGATIVE NEGATIVE   Leukocytes, UA TRACE (A) NEGATIVE   RBC / HPF 0-5 0 - 5 RBC/hpf   WBC, UA 0-5 0 - 5 WBC/hpf   Bacteria, UA NONE SEEN NONE SEEN   Squamous Epithelial / LPF 0-5 0 - 5  Wet prep, genital     Status: Abnormal   Collection Time: 08/31/17  8:21 AM  Result Value Ref Range   Yeast Wet Prep HPF POC PRESENT (A) NONE SEEN   Trich, Wet Prep NONE SEEN NONE SEEN   Clue Cells Wet Prep HPF POC NONE SEEN NONE SEEN   WBC, Wet Prep HPF POC FEW (A) NONE SEEN   Sperm NONE SEEN   CBC with Differential/Platelet     Status: None   Collection  Time: 08/31/17  8:50 AM  Result Value Ref Range   WBC 8.8 4.0 - 10.5 K/uL   RBC 4.63 3.87 - 5.11 MIL/uL   Hemoglobin 13.1 12.0 - 15.0 g/dL   HCT 16.1 09.6 - 04.5 %   MCV 84.7 78.0 - 100.0 fL   MCH 28.3 26.0 - 34.0 pg   MCHC 33.4 30.0 - 36.0 g/dL   RDW 40.9 81.1 - 91.4 %   Platelets 283 150 - 400 K/uL   Neutrophils Relative % 55 %   Neutro Abs 4.9 1.7 - 7.7 K/uL   Lymphocytes Relative 37 %   Lymphs Abs 3.3 0.7 - 4.0 K/uL   Monocytes Relative 4 %   Monocytes Absolute 0.4 0.1 - 1.0 K/uL   Eosinophils Relative 3 %   Eosinophils Absolute 0.3 0.0 - 0.7 K/uL   Basophils Relative 1 %   Basophils Absolute 0.0 0.0 - 0.1 K/uL  Comprehensive metabolic panel     Status: Abnormal   Collection Time: 08/31/17  8:50 AM  Result Value Ref Range   Sodium 142 135 - 145 mmol/L   Potassium 3.5 3.5 - 5.1 mmol/L   Chloride 106 101 - 111 mmol/L   CO2 24 22 - 32 mmol/L   Glucose, Bld 119 (H) 65 - 99 mg/dL   BUN 17 6 - 20 mg/dL   Creatinine, Ser 7.82 0.44 - 1.00 mg/dL   Calcium 9.4 8.9 - 95.6 mg/dL   Total Protein 7.6 6.5 - 8.1 g/dL   Albumin 4.2 3.5 - 5.0 g/dL   AST 20 15 - 41 U/L   ALT 14 14 - 54 U/L   Alkaline Phosphatase 77 38 - 126 U/L   Total Bilirubin 0.4 0.3 - 1.2 mg/dL   GFR calc non Af Amer >60 >60 mL/min   GFR calc Af Amer >60 >60 mL/min   Anion gap 12 5 - 15    Assessment and Plan  Pelvic pain in female  - Outpatient US PELVIS TRANSVAGINAL NON-OB (TV ONLY) in 2 wks - F/U with Dr. Jackelyn Knife in 3-4 wks - Information provided on pelvic pain  Urinary frequency  - Rx Macrobid 100 mg BID x 7 days - Information provided on urinary frequency in adults  - Discharge patient - Patient verbalized an understanding of the plan of care and agrees.    Raelyn Mora, MSN, CNM 08/31/2017, 8:36 AM

## 2017-08-31 NOTE — MAU Note (Addendum)
Past month having pains in lower abdominal. Reports vagina is dryer then normal and some irritation. Reports she had a hysterectomy. Tried advil. No bleeding. Reports increased frequency, no burning.

## 2017-08-31 NOTE — Discharge Instructions (Signed)
In late 2019, the [facility] will be moving to the [place] campus. At that time, the MAU (Maternity Admissions Unit), where you are being seen today, will no longer take care of non-pregnant patients. We strongly encourage you to find a doctor's office before that time, so that you can be seen with any GYN concerns, like vaginal discharge, urinary tract infection, etc.. in a timely manner. ° °In order to make an office visit more convenient, the [facility] will be offering evening hours with same-day appointments, walk-in appointments and scheduled appointments available during this time. ° °[facility] Hours: °Monday - 8am - 7:30 pm with walk-in between 4pm- 7:30 pm °Tuesday - 8 am - 5 pm (starting 07/20/17 we will be open late and accepting walk-ins from 4pm - 7:30pm) °Wednesday - 8 am - 5 pm (starting 10/20/17 we will be open late and accepting walk-ins from 4pm - 7:30pm) °Thursday 8 am - 5 pm (starting 01/20/18 we will be open late and accepting walk-ins from 4pm - 7:30pm) °Friday 8 am - 5 pm ° °For an appointment please call the [facility] at [phone] ° °For urgent needs, [facility] is also available for management of urgent GYN complaints such as vaginal discharge or urinary tract infections. ° ° ° ° ° °

## 2017-09-01 LAB — GC/CHLAMYDIA PROBE AMP (~~LOC~~) NOT AT ARMC
CHLAMYDIA, DNA PROBE: NEGATIVE
NEISSERIA GONORRHEA: NEGATIVE

## 2017-09-02 LAB — URINE CULTURE: Special Requests: NORMAL

## 2017-09-03 ENCOUNTER — Other Ambulatory Visit (HOSPITAL_COMMUNITY): Payer: Self-pay | Admitting: Obstetrics and Gynecology

## 2017-09-06 ENCOUNTER — Other Ambulatory Visit: Payer: Self-pay | Admitting: Obstetrics and Gynecology

## 2017-09-06 DIAGNOSIS — R102 Pelvic and perineal pain: Secondary | ICD-10-CM

## 2017-09-06 NOTE — Progress Notes (Signed)
Order entry for pelvis complete U/S  Raelyn Mora, CNM  09/06/2017 8:21 AM

## 2017-09-07 ENCOUNTER — Ambulatory Visit (HOSPITAL_COMMUNITY): Payer: PRIVATE HEALTH INSURANCE

## 2017-09-10 ENCOUNTER — Ambulatory Visit (HOSPITAL_COMMUNITY): Payer: PRIVATE HEALTH INSURANCE

## 2017-10-12 ENCOUNTER — Other Ambulatory Visit: Payer: Self-pay

## 2017-10-12 ENCOUNTER — Emergency Department (HOSPITAL_COMMUNITY): Payer: PRIVATE HEALTH INSURANCE

## 2017-10-12 ENCOUNTER — Encounter (HOSPITAL_COMMUNITY): Payer: Self-pay

## 2017-10-12 ENCOUNTER — Emergency Department (HOSPITAL_COMMUNITY)
Admission: EM | Admit: 2017-10-12 | Discharge: 2017-10-12 | Disposition: A | Discharge status: Home / Self Care | Payer: PRIVATE HEALTH INSURANCE | Attending: Emergency Medicine | Admitting: Emergency Medicine

## 2017-10-12 DIAGNOSIS — I10 Essential (primary) hypertension: Secondary | ICD-10-CM | POA: Insufficient documentation

## 2017-10-12 DIAGNOSIS — R509 Fever, unspecified: Secondary | ICD-10-CM | POA: Insufficient documentation

## 2017-10-12 DIAGNOSIS — F17210 Nicotine dependence, cigarettes, uncomplicated: Secondary | ICD-10-CM | POA: Diagnosis not present

## 2017-10-12 DIAGNOSIS — M7918 Myalgia, other site: Secondary | ICD-10-CM | POA: Insufficient documentation

## 2017-10-12 DIAGNOSIS — J020 Streptococcal pharyngitis: Secondary | ICD-10-CM | POA: Diagnosis not present

## 2017-10-12 DIAGNOSIS — Z79899 Other long term (current) drug therapy: Secondary | ICD-10-CM | POA: Diagnosis not present

## 2017-10-12 DIAGNOSIS — J029 Acute pharyngitis, unspecified: Secondary | ICD-10-CM | POA: Diagnosis present

## 2017-10-12 LAB — BASIC METABOLIC PANEL
ANION GAP: 8 (ref 5–15)
BUN: 10 mg/dL (ref 6–20)
CHLORIDE: 109 mmol/L (ref 98–111)
CO2: 26 mmol/L (ref 22–32)
Calcium: 9.3 mg/dL (ref 8.9–10.3)
Creatinine, Ser: 0.58 mg/dL (ref 0.44–1.00)
GFR calc Af Amer: >60 mL/min (ref >60)
GFR calc non Af Amer: >60 mL/min (ref >60)
GLUCOSE: 105 mg/dL — AB (ref 70–99)
Potassium: 3.8 mmol/L (ref 3.5–5.1)
Sodium: 143 mmol/L (ref 135–145)

## 2017-10-12 LAB — CBC WITH DIFFERENTIAL/PLATELET
Basophils Absolute: 0 10*3/uL (ref 0.0–0.1)
Basophils Relative: 0 %
Eosinophils Absolute: 0.2 10*3/uL (ref 0.0–0.7)
Eosinophils Relative: 1 %
HEMATOCRIT: 39.1 % (ref 36.0–46.0)
HEMOGLOBIN: 13.3 g/dL (ref 12.0–15.0)
LYMPHS ABS: 1.6 10*3/uL (ref 0.7–4.0)
LYMPHS PCT: 10 %
MCH: 28.9 pg (ref 26.0–34.0)
MCHC: 34 g/dL (ref 30.0–36.0)
MCV: 84.8 fL (ref 78.0–100.0)
Monocytes Absolute: 1.4 10*3/uL — ABNORMAL HIGH (ref 0.1–1.0)
Monocytes Relative: 8 %
NEUTROS ABS: 13.1 10*3/uL — AB (ref 1.7–7.7)
NEUTROS PCT: 81 %
Platelets: 254 10*3/uL (ref 150–400)
RBC: 4.61 MIL/uL (ref 3.87–5.11)
RDW: 14.5 % (ref 11.5–15.5)
WBC: 16.4 10*3/uL — AB (ref 4.0–10.5)

## 2017-10-12 LAB — LACTIC ACID, PLASMA: Lactic Acid, Venous: 0.7 mmol/L (ref 0.5–1.9)

## 2017-10-12 LAB — GROUP A STREP BY PCR: Group A Strep by PCR: DETECTED — AB

## 2017-10-12 MED ORDER — HYDROCODONE-ACETAMINOPHEN 7.5-325 MG/15ML PO SOLN: 15.0000 mL | Freq: Four times a day (QID) | ORAL | 0 refills | Status: AC | PRN

## 2017-10-12 MED ORDER — FENTANYL CITRATE (PF) 100 MCG/2ML IJ SOLN
25.0000 ug | INTRAMUSCULAR | Status: DC | PRN
  Administered 2017-10-12: 25 ug via INTRAVENOUS
  Filled 2017-10-12: qty 2

## 2017-10-12 MED ORDER — DEXAMETHASONE SODIUM PHOSPHATE 10 MG/ML IJ SOLN
10.0000 mg | Freq: Once | INTRAMUSCULAR | Status: AC
  Administered 2017-10-12: 10 mg via INTRAVENOUS
  Filled 2017-10-12: qty 1

## 2017-10-12 MED ORDER — CLINDAMYCIN HCL 150 MG PO CAPS: ORAL_CAPSULE | ORAL | 0 refills | Status: DC

## 2017-10-12 MED ORDER — IOHEXOL 300 MG/ML  SOLN
75.0000 mL | Freq: Once | INTRAMUSCULAR | Status: AC | PRN
  Administered 2017-10-12: 75 mL via INTRAVENOUS

## 2017-10-12 MED ORDER — CLINDAMYCIN PHOSPHATE 600 MG/50ML IV SOLN
600.0000 mg | Freq: Once | INTRAVENOUS | Status: AC
Start: 2017-10-12 — End: 2017-10-12
  Administered 2017-10-12: 600 mg via INTRAVENOUS
  Filled 2017-10-12: qty 50

## 2017-10-12 NOTE — Discharge Instructions (Addendum)
Stop the Z-pack and take the new antibiotic prescription as directed. Take over the counter liquid ibuprofen, as directed on packaging, as needed for discomfort.  Gargle with warm water several times per day to help with discomfort.  May also use over the counter sore throat pain medicines such as chloraseptic or sucrets, as directed on packaging, as needed for discomfort.  Call your regular medical doctor today to schedule a follow up appointment this week.  Return to the Emergency Department immediately if worsening.

## 2017-10-12 NOTE — ED Provider Notes (Signed)
[facility]-EMERGENCY DEPT Provider Note   CSN: [id number] Arrival date & time: 10/12/17  [id number]     History   Chief Complaint Chief Complaint  Patient presents with  . Sore Throat  . Generalized Body Aches  . Fever    HPI Dawn Atkinson is a 47 y.o. female.   Sore Throat   Fever      Pt was seen at 0845. Per pt, c/o gradual onset and worsening of persistent sore throat since yesterday. Has been associated with home temps to "100." Pt states she was evaluated by her [employer] office and was told she had strep throat. Pt was rx zithromax. Pt states she woke up today with worsening sore throat and "feeling swollen" on the right side of her throat. Denies hoarse voice, no drooling/stridor, no SOB/wheezing, no rash, no CP, no abd pain, no N/V/D.   Past Medical History:  Diagnosis Date  . Asthma   . Endometriosis   . Headache(784.0)   . Heart murmur   . Hypertension   . Interstitial cystitis   . Sickle cell trait (HCC)   . Vitamin D insufficiency     Patient Active Problem List   Diagnosis Date Noted  . Pelvic pain in female 08/31/2017  . Urinary frequency 08/31/2017  . Asthma 10/18/2010  . H/O: hysterectomy 10/18/2010  . Cystitis, interstitial 10/18/2010  . Constipation 10/18/2010    Past Surgical History:  Procedure Laterality Date  . ABDOMINAL HYSTERECTOMY    . ANKLE SURGERY    . BARTHOLIN GLAND CYST EXCISION    . CYSTECTOMY    . DILATION AND CURETTAGE OF UTERUS    . EYE SURGERY    . INGUINAL HERNIA REPAIR    . LAPAROSCOPY       OB History    Gravida  3   Para  1   Term      Preterm  0   AB  2   Living  1     SAB  1   TAB  1   Ectopic      Multiple      Live Births  1            Home Medications    Prior to Admission medications   Medication Sig Start Date End Date Taking? Authorizing Provider  albuterol (PROVENTIL HFA;VENTOLIN HFA) 108 (90 BASE) MCG/ACT inhaler Inhale 2 puffs into the lungs every 6  (six) hours as needed for wheezing or shortness of breath. 03/07/15  Yes Dowless, Samantha Tripp, PA-C  albuterol (PROVENTIL) (2.5 MG/3ML) 0.083% nebulizer solution Take 3 mLs (2.5 mg total) by nebulization every 6 (six) hours as needed for wheezing or shortness of breath. 03/07/15  Yes Dowless, Samantha Tripp, PA-C  atorvastatin (LIPITOR) 20 MG tablet Take 20 mg by mouth daily. 09/15/17  Yes [provider]  azithromycin (ZITHROMAX) 250 MG tablet Take 250-500 mg by mouth daily. Take 2 tablets on the first day, then take 1 tablet daily for the next 4 days 10/11/17  Yes [provider]  cetirizine (ZYRTEC) 10 MG tablet Take 10 mg by mouth daily. For allergies   Yes [provider]  Cholecalciferol (D3-50) 50000 units capsule Take 50,000 Units by mouth every 7 (seven) days. Takes on mondays 08/19/17 11/05/17 Yes [provider]  co-enzyme Q-10 30 MG capsule Take 30 mg by mouth daily.   Yes [provider]  Cod Liver Oil CAPS Take 1 capsule by mouth daily.  Yes [provider]  docusate sodium (COLACE) 100 MG capsule Take 100 mg by mouth daily as needed for mild constipation.   Yes [provider]  losartan (COZAAR) 50 MG tablet Take 50 mg by mouth daily.    Yes [provider]  Meth-Hyo-M Bl-Na Phos-Ph Sal (URIBEL) 118 MG CAPS Take 1 capsule by mouth daily as needed (urinary pain).    Yes [provider]  mometasone-formoterol (DULERA) 100-5 MCG/ACT AERO Inhale 2 puffs into the lungs 2 (two) times daily as needed.   Yes [provider]  ondansetron (ZOFRAN ODT) 4 MG disintegrating tablet Take 1 tablet (4 mg total) by mouth every 8 (eight) hours as needed. Patient taking differently: Take 4 mg by mouth every 8 (eight) hours as needed for nausea.  06/27/17  Yes Jacalyn Lefevre, MD  triamcinolone cream (KENALOG) 0.1 % Apply 1 application topically 3 (three) times daily as needed (eczema.).    Yes [provider]    VITAMIN E PO Take 1 capsule by mouth every other day.   Yes [provider]  fluconazole (DIFLUCAN) 150 MG tablet Take 1 tablet (150 mg total) by mouth daily. Patient not taking: Reported on 10/12/2017 08/31/17   Raelyn Mora, CNM    Family History Family History  Problem Relation Age of Onset  . Hypertension Mother   . Fibromyalgia Mother   . Hypertension Father   . Vitamin D deficiency Father   . Alzheimer's disease Maternal Grandmother   . Prostate cancer Maternal Grandfather   . Cirrhosis Paternal Grandmother   . Hypertension Paternal Grandmother   . Pneumonia Paternal Grandfather   . Hypertension Paternal Grandfather   . GER disease Brother   . Hypertension Sister   . Anxiety disorder Sister   . Asthma Child   . Pulmonary Hypertension Child   . Gout Child     Social History Social History   Tobacco Use  . Smoking status: Current Some Day Smoker    Packs/day: 0.25  . Smokeless tobacco: Never Used  . Tobacco comment: 1-2 cig  Substance Use Topics  . Alcohol use: No  . Drug use: No     Allergies   Amoxicillin-pot clavulanate; Avelox [moxifloxacin hydrochloride]; Hydrochlorothiazide; Erythromycin; Aspirin; Gabapentin; Lisinopril; Oxycodone; Sulfa antibiotics; and Tessalon [benzonatate]   Review of Systems Review of Systems  Constitutional: Positive for fever.   ROS: Statement: All systems negative except as marked or noted in the HPI; Constitutional: Negative for chills. ; ; Eyes: Negative for eye pain, redness and discharge. ; ; ENMT: Negative for ear pain, hoarseness, nasal congestion, sinus pressure and +sore throat. ; ; Cardiovascular: Negative for chest pain, palpitations, diaphoresis, dyspnea and peripheral edema. ; ; Respiratory: Negative for cough, wheezing and stridor. ; ; Gastrointestinal: Negative for nausea, vomiting, diarrhea, abdominal pain, blood in stool, hematemesis, jaundice and rectal bleeding. . ; ; Genitourinary: Negative for dysuria,  flank pain and hematuria. ; ; Musculoskeletal: Negative for back pain and neck pain. Negative for swelling and trauma.; ; Skin: Negative for pruritus, rash, abrasions, blisters, bruising and skin lesion.; ; Neuro: Negative for headache, lightheadedness and neck stiffness. Negative for weakness, altered level of consciousness, altered mental status, extremity weakness, paresthesias, involuntary movement, seizure and syncope.       Physical Exam Updated Vital Signs BP 123/85 (BP Location: Right Arm)   Pulse 69   Temp 99.1 F (37.3 C) (Oral)   Resp 18   Ht 5\' 6"  (1.676 m)   Wt 79.5 kg (  175 lb 6 oz)   SpO2 98%   BMI 28.31 kg/m   Physical Exam 0850: Physical examination:  Nursing notes reviewed; Vital signs and O2 SAT reviewed;  Constitutional: Well developed, Well nourished, Well hydrated, In no acute distress; Head:  Normocephalic, atraumatic; Eyes: EOMI, PERRL, No scleral icterus; ENMT: TM's clear bilat. Mouth and pharynx without lesions. +tonsillar exudates, enlarged tonsils R>L, erythema to posterior pharynx. No intra-oral edema. No submandibular or sublingual edema. No hoarse voice, no drooling, no stridor. No pain with manipulation of larynx. No trismus. Mouth and pharynx normal, Mucous membranes moist; Neck: Supple, Full range of motion, No lymphadenopathy; Cardiovascular: Regular rate and rhythm, No gallop; Respiratory: Breath sounds clear & equal bilaterally, No wheezes.  Speaking full sentences with ease, Normal respiratory effort/excursion; Chest: Nontender, Movement normal; Abdomen: Soft, Nontender, Nondistended, Normal bowel sounds; Genitourinary: No CVA tenderness; Extremities: Peripheral pulses normal, No tenderness, No edema, No calf edema or asymmetry.; Neuro: AA&Ox3, Major CN grossly intact.  Speech clear. No gross focal motor or sensory deficits in extremities.; Skin: Color normal, Warm, Dry.    ED Treatments / Results  Labs (all labs ordered are listed, but only abnormal  results are displayed)   EKG None  Radiology   Procedures Procedures (including critical care time)  Medications Ordered in ED Medications  fentaNYL (SUBLIMAZE) injection 25 mcg (25 mcg Intravenous Given 10/12/17 1030)  clindamycin (CLEOCIN) IVPB 600 mg (600 mg Intravenous New Bag/Given 10/12/17 1050)  dexamethasone (DECADRON) injection 10 mg (10 mg Intravenous Given 10/12/17 1030)     Initial Impression / Assessment and Plan / ED Course  I have reviewed the triage vital signs and the nursing notes.  Pertinent labs & imaging results that were available during my care of the patient were reviewed by me and considered in my medical decision making (see chart for details).  MDM Reviewed: previous chart, nursing note and vitals Reviewed previous: labs Interpretation: labs and CT scan   Results for orders placed or performed during the hospital encounter of 10/12/17  Group A Strep by PCR  Result Value Ref Range   Group A Strep by PCR DETECTED (A) NOT DETECTED  Basic metabolic panel  Result Value Ref Range   Sodium 143 135 - 145 mmol/L   Potassium 3.8 3.5 - 5.1 mmol/L   Chloride 109 98 - 111 mmol/L   CO2 26 22 - 32 mmol/L   Glucose, Bld 105 (H) 70 - 99 mg/dL   BUN 10 6 - 20 mg/dL   Creatinine, Ser 4.09 0.44 - 1.00 mg/dL   Calcium 9.3 8.9 - 81.1 mg/dL   GFR calc non Af Amer >60 >60 mL/min   GFR calc Af Amer >60 >60 mL/min   Anion gap 8 5 - 15  Lactic acid, plasma  Result Value Ref Range   Lactic Acid, Venous 0.7 0.5 - 1.9 mmol/L  CBC with Differential  Result Value Ref Range   WBC 16.4 (H) 4.0 - 10.5 K/uL   RBC 4.61 3.87 - 5.11 MIL/uL   Hemoglobin 13.3 12.0 - 15.0 g/dL   HCT 91.4 78.2 - 95.6 %   MCV 84.8 78.0 - 100.0 fL   MCH 28.9 26.0 - 34.0 pg   MCHC 34.0 30.0 - 36.0 g/dL   RDW 21.3 08.6 - 57.8 %   Platelets 254 150 - 400 K/uL   Neutrophils Relative % 81 %   Neutro Abs 13.1 (H) 1.7 - 7.7 K/uL   Lymphocytes Relative 10 %   Lymphs  Abs 1.6 0.7 - 4.0 K/uL    Monocytes Relative 8 %   Monocytes Absolute 1.4 (H) 0.1 - 1.0 K/uL   Eosinophils Relative 1 %   Eosinophils Absolute 0.2 0.0 - 0.7 K/uL   Basophils Relative 0 %   Basophils Absolute 0.0 0.0 - 0.1 K/uL   Ct Soft Tissue Neck W Contrast Result Date: 10/12/2017 CLINICAL DATA:  Right-sided neck swelling today. Recent strep throat. EXAM: CT NECK WITH CONTRAST TECHNIQUE: Multidetector CT imaging of the neck was performed using the standard protocol following the bolus administration of intravenous contrast. CONTRAST:  75mL OMNIPAQUE IOHEXOL 300 MG/ML  SOLN COMPARISON:  None. FINDINGS: Pharynx and larynx: Patient does appear to have mucosal thickening and enhancement which could go along with pharyngitis. The tonsils similarly are slightly prominent, more so on the right than the left. Tiny tonsillar low densities are present but there is no measurable abscess. Salivary glands: Parotid and submandibular glands are normal. Thyroid: Normal Lymph nodes: Mild reactive prominence of the lymph nodes, right more than left. No suppuration. Vascular: Normal Limited intracranial: Normal Visualized orbits: Normal Mastoids and visualized paranasal sinuses: Clear Skeleton: Normal Upper chest: Normal Other: None IMPRESSION: Generalized mucosal prominence and enhancement consistent with pharyngitis. Tonsils are also slightly prominent, right more than left, consistent with nonspecific tonsillitis. No evidence of measurable tonsillar abscess. No evidence of deep space inflammatory disease. Ordinary mild reactive nodal prominence right more than left, without suppuration. Electronically Signed   By: Paulina Fusi M.D.   On: 10/12/2017 11:56    1310:  Pt has tol PO well without N/V, choking or gagging. Feels better after meds. CT reassuring. IV clindamycin dosed while in the ED; will rx course. Dx and testing d/w pt and family.  Questions answered.  Verb understanding, agreeable to d/c home with outpt f/u.   Final Clinical  Impressions(s) / ED Diagnoses   Final diagnoses:  None    ED Discharge Orders    None       Samuel Jester, DO 10/15/17 1305

## 2017-10-12 NOTE — ED Triage Notes (Signed)
Patient reports that she had a sore throat yesterday and went to [facility] at her work and was diagnosed with strep. Patient was given a Z Pack. Today, the patient states she has right neck swelling and having difficulty swallowing, body aches, and a temp at home of 100.0.

## 2017-10-12 NOTE — ED Notes (Signed)
Ginger ale given

## 2018-03-25 ENCOUNTER — Emergency Department (HOSPITAL_COMMUNITY)
Admission: EM | Admit: 2018-03-25 | Discharge: 2018-03-25 | Disposition: A | Discharge status: Home / Self Care | Payer: PRIVATE HEALTH INSURANCE | Attending: Emergency Medicine | Admitting: Emergency Medicine

## 2018-03-25 ENCOUNTER — Other Ambulatory Visit: Payer: Self-pay

## 2018-03-25 ENCOUNTER — Encounter (HOSPITAL_COMMUNITY): Payer: Self-pay | Admitting: Emergency Medicine

## 2018-03-25 ENCOUNTER — Emergency Department (HOSPITAL_COMMUNITY): Payer: PRIVATE HEALTH INSURANCE

## 2018-03-25 DIAGNOSIS — J45909 Unspecified asthma, uncomplicated: Secondary | ICD-10-CM | POA: Insufficient documentation

## 2018-03-25 DIAGNOSIS — J069 Acute upper respiratory infection, unspecified: Secondary | ICD-10-CM | POA: Insufficient documentation

## 2018-03-25 DIAGNOSIS — I10 Essential (primary) hypertension: Secondary | ICD-10-CM | POA: Diagnosis not present

## 2018-03-25 DIAGNOSIS — R05 Cough: Secondary | ICD-10-CM | POA: Diagnosis present

## 2018-03-25 DIAGNOSIS — F17200 Nicotine dependence, unspecified, uncomplicated: Secondary | ICD-10-CM | POA: Insufficient documentation

## 2018-03-25 DIAGNOSIS — Z79899 Other long term (current) drug therapy: Secondary | ICD-10-CM | POA: Diagnosis not present

## 2018-03-25 LAB — INFLUENZA PANEL BY PCR (TYPE A & B)
Influenza A By PCR: NEGATIVE
Influenza B By PCR: NEGATIVE

## 2018-03-25 LAB — GROUP A STREP BY PCR: Group A Strep by PCR: NOT DETECTED

## 2018-03-25 MED ORDER — DEXAMETHASONE 1 MG/ML PO CONC
10.0000 mg | Freq: Once | ORAL | Status: AC
  Administered 2018-03-25: 10 mg via ORAL
  Filled 2018-03-25: qty 10

## 2018-03-25 MED ORDER — ACETAMINOPHEN 325 MG PO TABS
650.0000 mg | ORAL_TABLET | Freq: Once | ORAL | Status: AC | PRN
  Administered 2018-03-25: 650 mg via ORAL
  Filled 2018-03-25: qty 2

## 2018-03-25 MED ORDER — ALBUTEROL SULFATE HFA 108 (90 BASE) MCG/ACT IN AERS: 1.0000 | INHALATION_SPRAY | Freq: Four times a day (QID) | RESPIRATORY_TRACT | 0 refills | Status: AC | PRN

## 2018-03-25 MED ORDER — OSELTAMIVIR PHOSPHATE 75 MG PO CAPS: 75.0000 mg | ORAL_CAPSULE | Freq: Two times a day (BID) | ORAL | 0 refills | Status: AC

## 2018-03-25 NOTE — ED Notes (Signed)
Patient able to ambulate independently  

## 2018-03-25 NOTE — ED Provider Notes (Signed)
[facility] EMERGENCY DEPARTMENT Provider Note   CSN: [id number] Arrival date & time: 03/25/18  1412     History   Chief Complaint Chief Complaint  Patient presents with  . Nasal Congestion  . Cough  . Sore Throat    HPI ANWYN KRIEGEL is a 47 y.o. female.  47 year old female with history of asthma, hypertension, sickle cell trait, current everyday smoker presents with complaint of nonproductive cough, congestion, watery itchy eyes, sore throat, posttussive emesis x1.  Patient states her symptoms started 2 days ago, states that she has allergies and thought originally she had worsening allergies however now feels like she has all over body aches and suddenly feels terrible.  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  Patient has been taking OTC cold medications with limited relief, has not had any medications since yesterday, denies fevers or chills, rash, changes in bowel or bladder habits. No other complaints or concerns.      Past Medical History:  Diagnosis Date  . Asthma   . Endometriosis   . Headache(784.0)   . Heart murmur   . Hypertension   . Interstitial cystitis   . Sickle cell trait (HCC)   . Vitamin D insufficiency     Patient Active Problem List   Diagnosis Date Noted  . Pelvic pain in female 08/31/2017  . Urinary frequency 08/31/2017  . Asthma 10/18/2010  . H/O: hysterectomy 10/18/2010  . Cystitis, interstitial 10/18/2010  . Constipation 10/18/2010    Past Surgical History:  Procedure Laterality Date  . ABDOMINAL HYSTERECTOMY    . ANKLE SURGERY    . BARTHOLIN GLAND CYST EXCISION    . CYSTECTOMY    . DILATION AND CURETTAGE OF UTERUS    . EYE SURGERY    . INGUINAL HERNIA REPAIR    . LAPAROSCOPY       OB History    Gravida  3   Para  1   Term      Preterm  0   AB  2   Living  1     SAB  1   TAB  1   Ectopic      Multiple      Live Births  1            Home Medications    Prior to Admission medications    Medication Sig Start Date End Date Taking? Authorizing Provider  albuterol (PROVENTIL HFA;VENTOLIN HFA) 108 (90 BASE) MCG/ACT inhaler Inhale 2 puffs into the lungs every 6 (six) hours as needed for wheezing or shortness of breath. 03/07/15   Dowless, Lelon Mast Tripp, PA-C  albuterol (PROVENTIL HFA;VENTOLIN HFA) 108 (90 Base) MCG/ACT inhaler Inhale 1-2 puffs into the lungs every 6 (six) hours as needed for wheezing or shortness of breath. 03/25/18   Jeannie Fend, PA-C  albuterol (PROVENTIL) (2.5 MG/3ML) 0.083% nebulizer solution Take 3 mLs (2.5 mg total) by nebulization every 6 (six) hours as needed for wheezing or shortness of breath. 03/07/15   Dowless, Lelon Mast Tripp, PA-C  atorvastatin (LIPITOR) 20 MG tablet Take 20 mg by mouth daily. 09/15/17   [provider]  cetirizine (ZYRTEC) 10 MG tablet Take 10 mg by mouth daily. For allergies    [provider]  clindamycin (CLEOCIN) 150 MG capsule 3 tabs PO TID x 10 days 10/12/17   Samuel Jester, DO  co-enzyme Q-10 30 MG capsule Take 30 mg by mouth daily.    [provider]  [facility]  Liver Oil CAPS Take 1 capsule by mouth daily.    [provider]  docusate sodium (COLACE) 100 MG capsule Take 100 mg by mouth daily as needed for mild constipation.    [provider]  fluconazole (DIFLUCAN) 150 MG tablet Take 1 tablet (150 mg total) by mouth daily. Patient not taking: Reported on 10/12/2017 08/31/17   Raelyn Moraawson, Rolitta, CNM  losartan (COZAAR) 50 MG tablet Take 50 mg by mouth daily.     [provider]  Meth-Hyo-M Bl-Na Phos-Ph Sal (URIBEL) 118 MG CAPS Take 1 capsule by mouth daily as needed (urinary pain).     [provider]  mometasone-formoterol (DULERA) 100-5 MCG/ACT AERO Inhale 2 puffs into the lungs 2 (two) times daily as needed.    [provider]  ondansetron (ZOFRAN ODT) 4 MG disintegrating tablet Take 1 tablet (4 mg total) by mouth every 8 (eight) hours as needed. Patient taking  differently: Take 4 mg by mouth every 8 (eight) hours as needed for nausea.  06/27/17   Jacalyn LefevreHaviland, Julie, MD  oseltamivir (TAMIFLU) 75 MG capsule Take 1 capsule (75 mg total) by mouth 2 (two) times daily for 5 days. 03/25/18 03/30/18  Jeannie FendMurphy, Jayden Kratochvil A, PA-C  triamcinolone cream (KENALOG) 0.1 % Apply 1 application topically 3 (three) times daily as needed (eczema.).     [provider]  VITAMIN E PO Take 1 capsule by mouth every other day.    [provider]    Family History Family History  Problem Relation Age of Onset  . Hypertension Mother   . Fibromyalgia Mother   . Hypertension Father   . Vitamin D deficiency Father   . Alzheimer's disease Maternal Grandmother   . Prostate cancer Maternal Grandfather   . Cirrhosis Paternal Grandmother   . Hypertension Paternal Grandmother   . Pneumonia Paternal Grandfather   . Hypertension Paternal Grandfather   . GER disease Brother   . Hypertension Sister   . Anxiety disorder Sister   . Asthma Child   . Pulmonary Hypertension Child   . Gout Child     Social History Social History   Tobacco Use  . Smoking status: Current Some Day Smoker    Packs/day: 0.25  . Smokeless tobacco: Never Used  . Tobacco comment: 1-2 cig  Substance Use Topics  . Alcohol use: No  . Drug use: No     Allergies   Amoxicillin-pot clavulanate; Avelox [moxifloxacin hydrochloride]; Hydrochlorothiazide; Erythromycin; Penicillins; Aspirin; Gabapentin; Lisinopril; Oxycodone; Sulfa antibiotics; and Tessalon [benzonatate]   Review of Systems Review of Systems  Constitutional: Negative for chills and fever.  HENT: Positive for congestion, postnasal drip, rhinorrhea, sneezing and sore throat. Negative for ear pain, sinus pressure, sinus pain, trouble swallowing and voice change.   Eyes: Positive for discharge and itching. Negative for redness.       Watery drainage  Respiratory: Positive for cough. Negative for shortness of breath and wheezing.     Gastrointestinal: Positive for vomiting. Negative for diarrhea and nausea.  Musculoskeletal: Positive for arthralgias and myalgias.  Skin: Negative for rash and wound.  Allergic/Immunologic: Negative for immunocompromised state.  Neurological: Negative for weakness and headaches.  Hematological: Negative for adenopathy.  All other systems reviewed and are negative.    Physical Exam Updated Vital Signs BP (!) 141/93   Pulse 86   Temp 99.5 F (37.5 C) (Oral)   Resp 16   Ht 5\' 6"  (1.676 m)   Wt 83.9 kg   SpO2 100%  BMI 29.86 kg/m   Physical Exam  Constitutional: She is oriented to person, place, and time. She appears well-developed and well-nourished. No distress.  HENT:  Head: Normocephalic and atraumatic.  Right Ear: Ear canal normal. Tympanic membrane is not erythematous, not retracted and not bulging. A middle ear effusion is present.  Left Ear: Ear canal normal. Tympanic membrane is not erythematous, not retracted and not bulging. A middle ear effusion is present.  Nose: Mucosal edema present. Right sinus exhibits no maxillary sinus tenderness and no frontal sinus tenderness. Left sinus exhibits no maxillary sinus tenderness and no frontal sinus tenderness.  Mouth/Throat: Uvula is midline and mucous membranes are normal. No uvula swelling. Posterior oropharyngeal erythema present. No posterior oropharyngeal edema or tonsillar abscesses. Tonsils are 2+ on the right. Tonsils are 2+ on the left. No tonsillar exudate.  Eyes: Pupils are equal, round, and reactive to light. EOM are normal.  Neck: Neck supple.  Cardiovascular: Normal rate, regular rhythm and intact distal pulses.  Pulmonary/Chest: Effort normal and breath sounds normal. No respiratory distress. She has no wheezes.  Lymphadenopathy:    She has no cervical adenopathy.  Neurological: She is alert and oriented to person, place, and time.  Skin: Skin is warm and dry. No rash noted. She is not diaphoretic.   Psychiatric: She has a normal mood and affect. Her behavior is normal.  Nursing note and vitals reviewed.    ED Treatments / Results  Labs (all labs ordered are listed, but only abnormal results are displayed) Labs Reviewed  GROUP A STREP BY PCR  INFLUENZA PANEL BY PCR (TYPE A & B)    EKG None  Radiology Dg Chest 2 View  Result Date: 03/25/2018 CLINICAL DATA:  Cough.  Sore throat and fever. EXAM: CHEST - 2 VIEW COMPARISON:  07/30/2016 FINDINGS: The heart size and mediastinal contours are within normal limits. Both lungs are clear. The visualized skeletal structures are unremarkable. No definite pleural effusions. IMPRESSION: No active cardiopulmonary disease. Electronically Signed   By: Richarda Overlie M.D.   On: 03/25/2018 14:57    Procedures Procedures (including critical care time)  Medications Ordered in ED Medications  dexamethasone (DECADRON) 1 MG/ML solution 10 mg (has no administration in time range)  acetaminophen (TYLENOL) tablet 650 mg (650 mg Oral Given 03/25/18 1430)     Initial Impression / Assessment and Plan / ED Course  I have reviewed the triage vital signs and the nursing notes.  Pertinent labs & imaging results that were available during my care of the patient were reviewed by me and considered in my medical decision making (see chart for details).  Clinical Course as of Mar 26 1647  Fri Mar 25, 2018  [id number] 47 year old female with complaint of URI symptoms x2 days.  Review of triage vitals, patient probably tachycardic with temperature of 100.2, has not taken any antipyretics today and was unaware of fever or slight elevation temperature prior to triage vitals.  Repeat vitals without intervention, tachycardia has resolved, repeat temp of 99.5.  Patient strep swab and flu swabs are negative, her chest x-ray is normal.  Due to sudden onset of flulike symptoms, patient will be offered Tamiflu, also given dose of Decadron today for complaint of sore throat with  history of enlarged tonsils and scheduled for tonsillectomy (also uvula and nasal turbinates).  Refill of albuterol inhaler given today, lung sounds clear and respiratory effort normal.  Patient to recheck with PCP next week, return to ER for any worsening or concerning  symptoms.   [LM]    Clinical Course User Index [LM] Jeannie Fend, PA-C   Final Clinical Impressions(s) / ED Diagnoses   Final diagnoses:  Upper respiratory tract infection, unspecified type    ED Discharge Orders         Ordered    oseltamivir (TAMIFLU) 75 MG capsule  2 times daily     03/25/18 1643    albuterol (PROVENTIL HFA;VENTOLIN HFA) 108 (90 Base) MCG/ACT inhaler  Every 6 hours PRN     03/25/18 1643           Jeannie Fend, PA-C 03/25/18 1648    Donnetta Hutching, MD 03/26/18 [phone]

## 2018-03-25 NOTE — Discharge Instructions (Signed)
Your flu test today is negative. Take Tamiflu as discussed. Saline sinus rinse twice daily, daily Zyrtec, daily Flonase. Very limited use of Afrin as discussed. Use inhaler as needed as discussed.   Follow-up with your primary care provider next week, return to ER for new or worsening symptoms.

## 2018-03-25 NOTE — ED Notes (Signed)
Patient able to ambulate independently to restroom, gait steady and even 

## 2018-03-25 NOTE — ED Triage Notes (Signed)
Onset 3-4 days ago developed nasal congestion clear, sore throat, and dry cough. States cough attack severe at times feels the needs to have an emesis and did today. Airway intact bilateral equal chest rise and fall.

## 2019-05-16 IMAGING — CT CT HEAD W/O CM
4 series · 16 of 47 positions shown, 18 images · non-contrast
Comparison: June 27, 2014

CLINICAL DATA: Headache.  Sickle cell trait

EXAM:
CT HEAD WITHOUT CONTRAST
TECHNIQUE: Contiguous axial images were obtained from the base of the skull
through the vertex without intravenous contrast.

[Series 3: head without · axial · non-contrast · 0.41mm/px · z∈[-54,+66]mm · 7 of 32 slices shown, 9 images]
[im 4/32  brain]
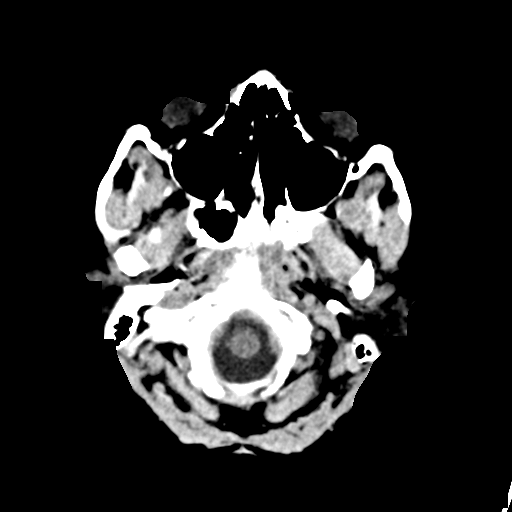
[im 4/32  bone]
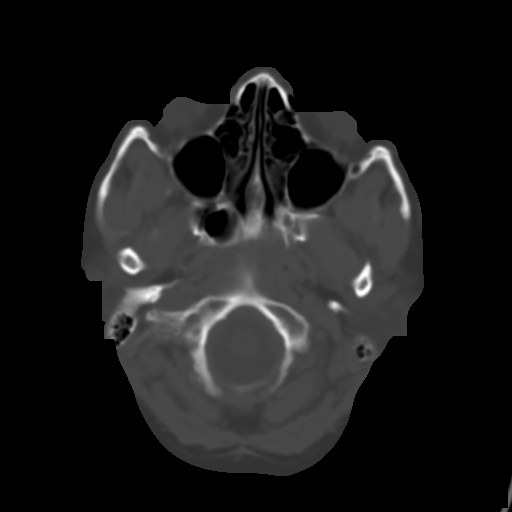
[im 8/32  brain]
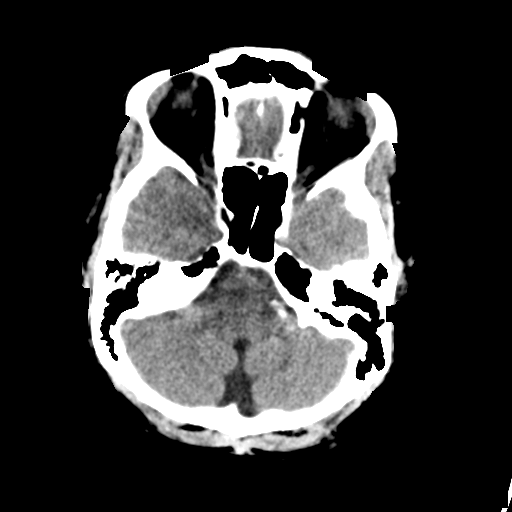
[im 12/32  brain]
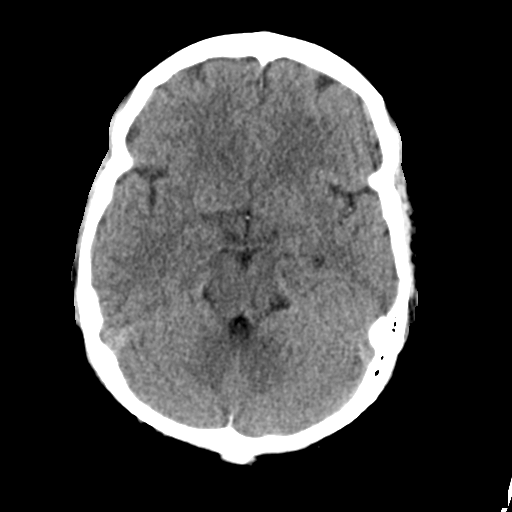
[im 16/32  brain]
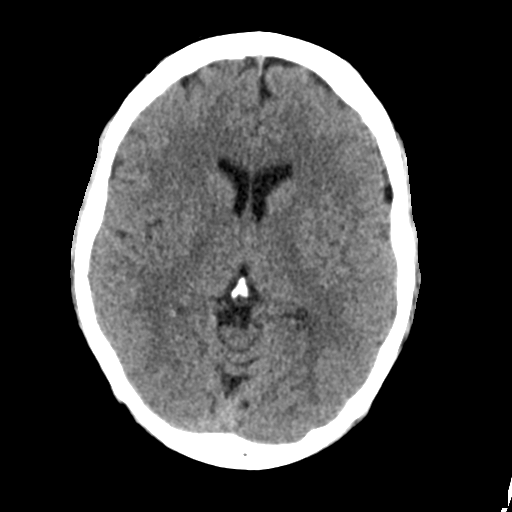
[im 20/32  brain]
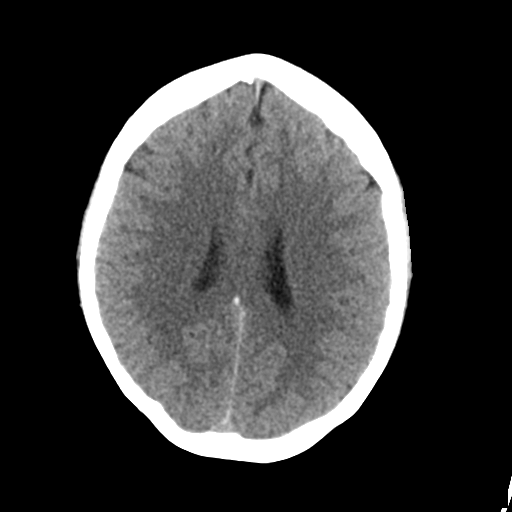
[im 20/32  bone]
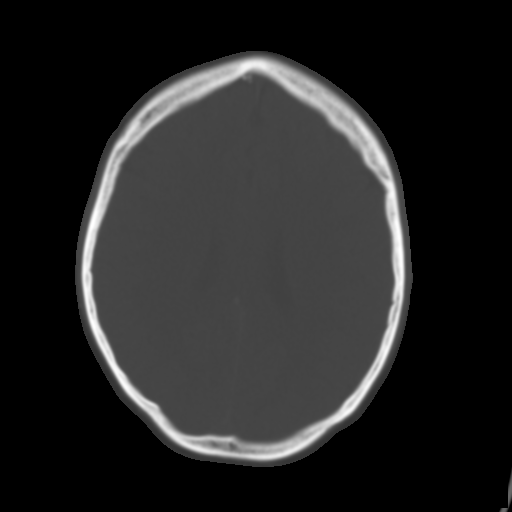
[im 24/32  brain]
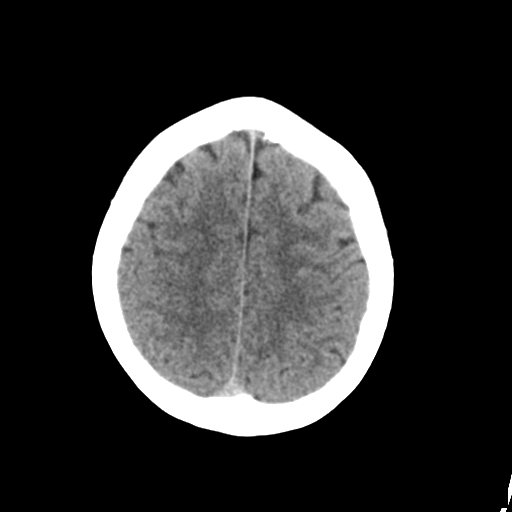
[im 28/32  brain]
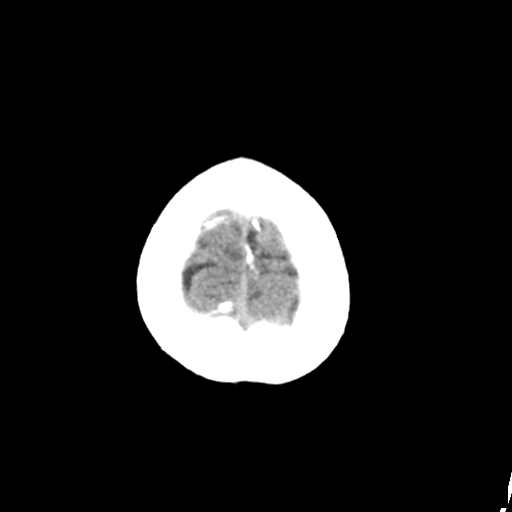

[Series 4: head bone · axial · 0.41mm/px · z∈[-56,-24]mm · 3 of 80 slices shown]
[im 8/80  bone]
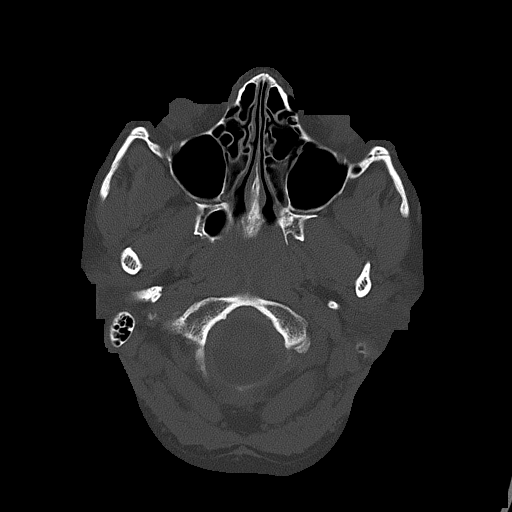
[im 16/80  bone]
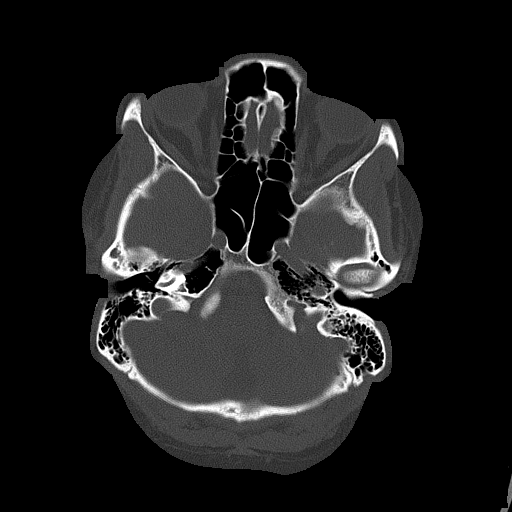
[im 24/80  bone]
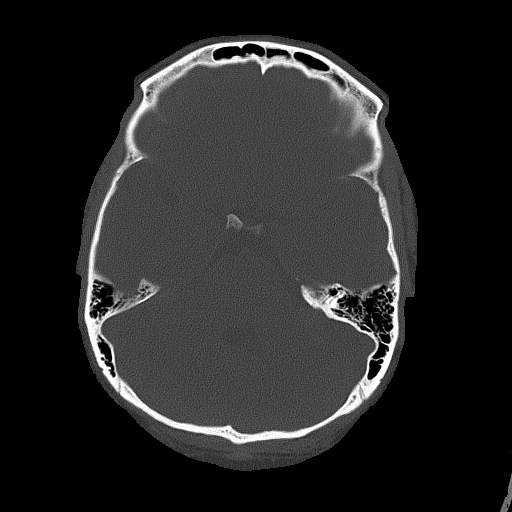

[Series 5: head without cor · coronal · non-contrast · 0.31mm/px · 3 of 69 slices shown]
[im 23/69  brain]
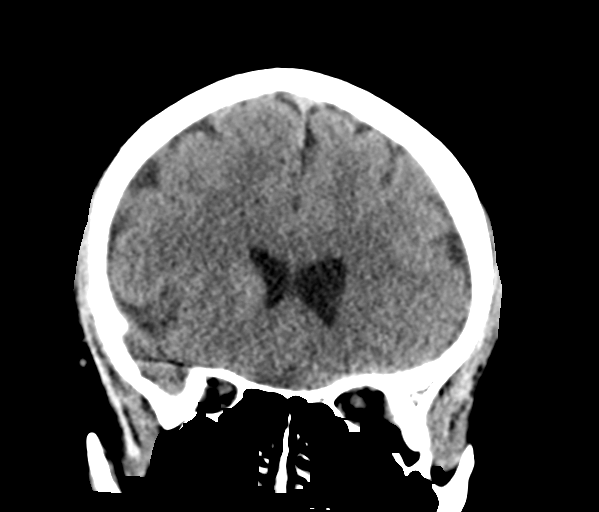
[im 31/69  brain]
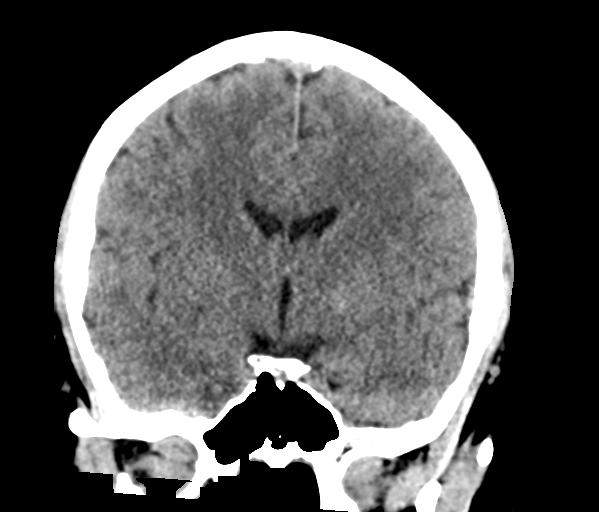
[im 38/69  brain]
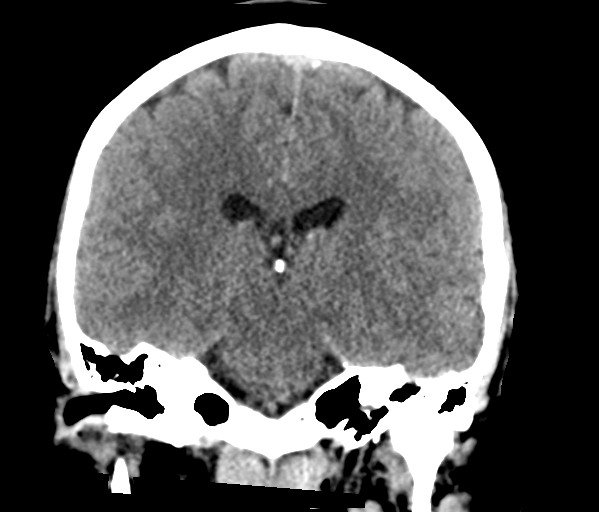

[Series 6: head without sag · sagittal · non-contrast · 0.31mm/px · 3 of 67 slices shown]
[im 23/67  brain]
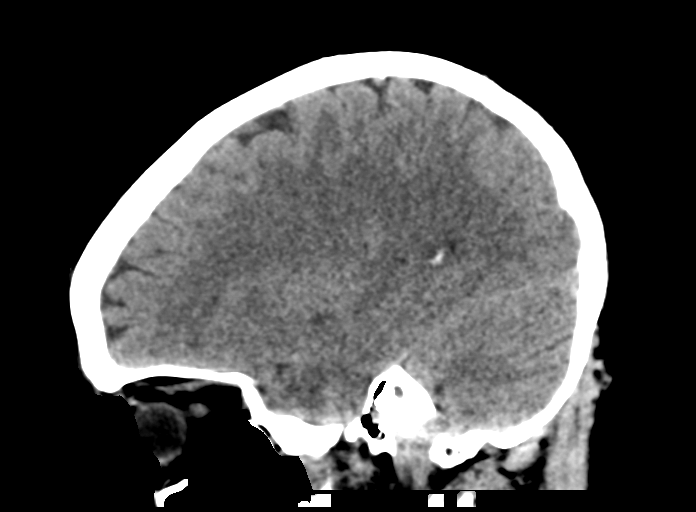
[im 34/67  brain]
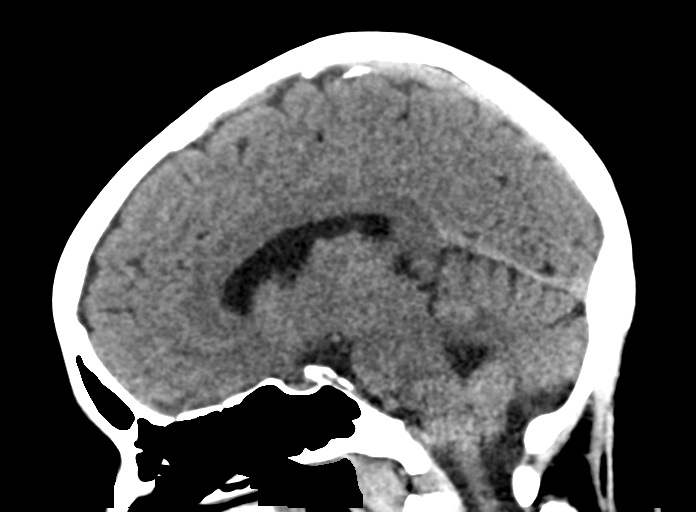
[im 45/67  brain]
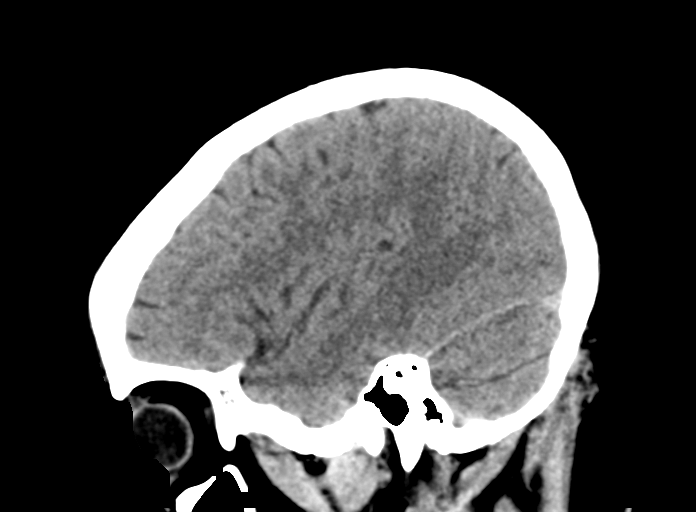

[16 of 47 positions shown; findings below may reference images not displayed]

FINDINGS: Brain: The ventricles are normal in size and configuration.
Prominence of the cisterna magna is a stable anatomic variant. There
is no intracranial mass, hemorrhage, extra-axial fluid collection,
or midline shift. Gray-white compartments are normal. No evident
acute infarct.

Vascular: There is no hyperdense vessel. There is no appreciable
arterial vascular calcification.

Skull: The bony calvarium appears intact.

Sinuses/Orbits: There is slight mucosal thickening in several
ethmoid air cells bilaterally. Other visualized paranasal sinuses
are clear. There is rightward deviation of the nasal septum. Orbits
appear symmetric bilaterally.

Other: Mastoid air cells are clear.
IMPRESSION: No intracranial mass, hemorrhage, or extra-axial fluid collection.
Gray-white compartments appear normal. Mild ethmoid sinus disease.
Rightward deviation of nasal septum.

## 2019-05-23 ENCOUNTER — Emergency Department (HOSPITAL_COMMUNITY): Payer: Self-pay

## 2019-05-23 ENCOUNTER — Encounter (HOSPITAL_COMMUNITY): Payer: Self-pay

## 2019-05-23 ENCOUNTER — Emergency Department (HOSPITAL_COMMUNITY)
Admission: EM | Admit: 2019-05-23 | Discharge: 2019-05-23 | Disposition: A | Discharge status: Home / Self Care | Payer: Self-pay | Attending: Emergency Medicine | Admitting: Emergency Medicine

## 2019-05-23 ENCOUNTER — Other Ambulatory Visit: Payer: Self-pay

## 2019-05-23 DIAGNOSIS — S52501A Unspecified fracture of the lower end of right radius, initial encounter for closed fracture: Secondary | ICD-10-CM

## 2019-05-23 DIAGNOSIS — I10 Essential (primary) hypertension: Secondary | ICD-10-CM | POA: Insufficient documentation

## 2019-05-23 DIAGNOSIS — Y939 Activity, unspecified: Secondary | ICD-10-CM | POA: Insufficient documentation

## 2019-05-23 DIAGNOSIS — Y929 Unspecified place or not applicable: Secondary | ICD-10-CM | POA: Insufficient documentation

## 2019-05-23 DIAGNOSIS — Y990 Civilian activity done for income or pay: Secondary | ICD-10-CM | POA: Insufficient documentation

## 2019-05-23 DIAGNOSIS — W010XXA Fall on same level from slipping, tripping and stumbling without subsequent striking against object, initial encounter: Secondary | ICD-10-CM | POA: Insufficient documentation

## 2019-05-23 DIAGNOSIS — Z79899 Other long term (current) drug therapy: Secondary | ICD-10-CM | POA: Insufficient documentation

## 2019-05-23 DIAGNOSIS — F17210 Nicotine dependence, cigarettes, uncomplicated: Secondary | ICD-10-CM | POA: Insufficient documentation

## 2019-05-23 DIAGNOSIS — S52591A Other fractures of lower end of right radius, initial encounter for closed fracture: Secondary | ICD-10-CM | POA: Insufficient documentation

## 2019-05-23 MED ORDER — IBUPROFEN 600 MG PO TABS: 600.0000 mg | ORAL_TABLET | Freq: Four times a day (QID) | ORAL | 0 refills | Status: AC | PRN

## 2019-05-23 MED ORDER — HYDROCODONE-ACETAMINOPHEN 5-325 MG PO TABS
1.0000 | ORAL_TABLET | Freq: Once | ORAL | Status: AC
  Administered 2019-05-23: 12:00:00 1 via ORAL
  Filled 2019-05-23: qty 1

## 2019-05-23 MED ORDER — HYDROCODONE-ACETAMINOPHEN 5-325 MG PO TABS: 1.0000 | ORAL_TABLET | Freq: Four times a day (QID) | ORAL | 0 refills | Status: DC | PRN

## 2019-05-23 NOTE — ED Notes (Signed)
Ortho at bedside.

## 2019-05-23 NOTE — ED Provider Notes (Signed)
[facility]-EMERGENCY DEPT Provider Note   CSN: [id number] Arrival date & time: 05/23/19  1006     History Chief Complaint  Patient presents with  . Wrist Injury  . Fall    Dawn Atkinson is a 49 y.o. female.  Dawn Atkinson is a 48 y.o. female with a history of hypertension, sickle cell trait, heart murmur, endometriosis, who presents to the ED for evaluation of wrist injury.  Patient states that yesterday she was wearing a new pair of shoes at work and they gave out and she fell catching herself on her right wrist.  She reports initially she felt like she was fine she continued to work throughout the day, but states that she has had worsening wrist pain and swelling since the fall.  Pain is most severe over the radial aspect of her right wrist and radiates up her arm.  She reports swelling around the wrist, no swelling at the elbow or shoulder.  She has not taken anything prior to arrival to treat pain.  She denies numbness tingling or weakness.  Denies previous injury or surgery to the wrist.  No other aggravating or alleviating factors.        Past Medical History:  Diagnosis Date  . Asthma   . Endometriosis   . Headache(784.0)   . Heart murmur   . Hypertension   . Interstitial cystitis   . Sickle cell trait (HCC)   . Vitamin D insufficiency     Patient Active Problem List   Diagnosis Date Noted  . Pelvic pain in female 08/31/2017  . Urinary frequency 08/31/2017  . Asthma 10/18/2010  . H/O: hysterectomy 10/18/2010  . Cystitis, interstitial 10/18/2010  . Constipation 10/18/2010    Past Surgical History:  Procedure Laterality Date  . ABDOMINAL HYSTERECTOMY    . ANKLE SURGERY    . BARTHOLIN GLAND CYST EXCISION    . COLONOSCOPY    . CYSTECTOMY    . DILATION AND CURETTAGE OF UTERUS    . EYE SURGERY    . INGUINAL HERNIA REPAIR    . LAPAROSCOPY    . UPPER GI ENDOSCOPY       OB History    Gravida  3   Para  1   Term      Preterm  0   AB  2   Living  1     SAB  1   TAB  1   Ectopic      Multiple      Live Births  1           Family History  Problem Relation Age of Onset  . Hypertension Mother   . Fibromyalgia Mother   . Hypertension Father   . Vitamin D deficiency Father   . Alzheimer's disease Maternal Grandmother   . Prostate cancer Maternal Grandfather   . Cirrhosis Paternal Grandmother   . Hypertension Paternal Grandmother   . Pneumonia Paternal Grandfather   . Hypertension Paternal Grandfather   . GER disease Brother   . Hypertension Sister   . Anxiety disorder Sister   . Asthma Child   . Pulmonary Hypertension Child   . Gout Child     Social History   Tobacco Use  . Smoking status: Current Some Day Smoker    Packs/day: 0.25    Types: Cigarettes  . Smokeless tobacco: Never Used  . Tobacco comment: 1-2 cig  Substance Use Topics  . Alcohol use: No  .  Drug use: No    Home Medications Prior to Admission medications   Medication Sig Start Date End Date Taking? Authorizing Provider  albuterol (PROVENTIL HFA;VENTOLIN HFA) 108 (90 BASE) MCG/ACT inhaler Inhale 2 puffs into the lungs every 6 (six) hours as needed for wheezing or shortness of breath. 03/07/15   Dowless, Lelon Mast Tripp, PA-C  albuterol (PROVENTIL HFA;VENTOLIN HFA) 108 (90 Base) MCG/ACT inhaler Inhale 1-2 puffs into the lungs every 6 (six) hours as needed for wheezing or shortness of breath. 03/25/18   Jeannie Fend, PA-C  albuterol (PROVENTIL) (2.5 MG/3ML) 0.083% nebulizer solution Take 3 mLs (2.5 mg total) by nebulization every 6 (six) hours as needed for wheezing or shortness of breath. 03/07/15   Dowless, Lelon Mast Tripp, PA-C  atorvastatin (LIPITOR) 20 MG tablet Take 20 mg by mouth daily. 09/15/17   [provider]  cetirizine (ZYRTEC) 10 MG tablet Take 10 mg by mouth daily. For allergies    [provider]  clindamycin (CLEOCIN) 150 MG capsule 3 tabs PO TID x 10 days 10/12/17   Samuel Jester, DO    co-enzyme Q-10 30 MG capsule Take 30 mg by mouth daily.    [provider]  [facility] Liver Oil CAPS Take 1 capsule by mouth daily.    [provider]  docusate sodium (COLACE) 100 MG capsule Take 100 mg by mouth daily as needed for mild constipation.    [provider]  fluconazole (DIFLUCAN) 150 MG tablet Take 1 tablet (150 mg total) by mouth daily. Patient not taking: Reported on 10/12/2017 08/31/17   Raelyn Mora, CNM  losartan (COZAAR) 50 MG tablet Take 50 mg by mouth daily.     [provider]  Meth-Hyo-M Bl-Na Phos-Ph Sal (URIBEL) 118 MG CAPS Take 1 capsule by mouth daily as needed (urinary pain).     [provider]  mometasone-formoterol (DULERA) 100-5 MCG/ACT AERO Inhale 2 puffs into the lungs 2 (two) times daily as needed.    [provider]  ondansetron (ZOFRAN ODT) 4 MG disintegrating tablet Take 1 tablet (4 mg total) by mouth every 8 (eight) hours as needed. Patient taking differently: Take 4 mg by mouth every 8 (eight) hours as needed for nausea.  06/27/17   Jacalyn Lefevre, MD  triamcinolone cream (KENALOG) 0.1 % Apply 1 application topically 3 (three) times daily as needed (eczema.).     [provider]  VITAMIN E PO Take 1 capsule by mouth every other day.    [provider]    Allergies    Amoxicillin-pot clavulanate, Avelox [moxifloxacin hydrochloride], Hydrochlorothiazide, Erythromycin, Penicillins, Aspirin, Gabapentin, Lisinopril, Oxycodone, Sulfa antibiotics, and Tessalon [benzonatate]  Review of Systems   Review of Systems  Constitutional: Negative for chills and fever.  Musculoskeletal: Positive for arthralgias and joint swelling.  Skin: Negative for color change and rash.  Neurological: Negative for weakness and numbness.    Physical Exam Updated Vital Signs BP (!) 185/101 (BP Location: Left Arm)   Pulse 96   Temp 98.6 F (37 C) (Oral)   Resp 16   Ht 5\' 6"  (1.676 m)   Wt 88.5 kg   SpO2 100%    BMI 31.47 kg/m   Physical Exam Vitals and nursing note reviewed.  Constitutional:      General: She is not in acute distress.    Appearance: She is well-developed. She is not diaphoretic.  HENT:     Head: Normocephalic and atraumatic.  Eyes:     General:  Right eye: No discharge.        Left eye: No discharge.  Pulmonary:     Effort: Pulmonary effort is normal. No respiratory distress.  Musculoskeletal:     Comments: Tenderness and swelling noted over the right wrist with focal pain over the distal radius, some soreness with movement of the proximal forearm and elbow but no swelling or deformity noted.  2+ radial pulse and good capillary refill, cardinal hand movements intact, normal sensation.  Range of motion of the wrist is limited due to pain.  Skin:    General: Skin is warm and dry.  Neurological:     Mental Status: She is alert and oriented to person, place, and time.     Coordination: Coordination normal.  Psychiatric:        Mood and Affect: Mood normal.        Behavior: Behavior normal.     ED Results / Procedures / Treatments   Labs (all labs ordered are listed, but only abnormal results are displayed) Labs Reviewed - No data to display  EKG None  Radiology DG Wrist Complete Right  Result Date: 05/23/2019 CLINICAL DATA:  Right wrist pain.  Status post fall. EXAM: RIGHT WRIST - COMPLETE 3+ VIEW COMPARISON:  None. FINDINGS: There is a subtle acute nondisplaced fracture along the dorsal distal radial metaphysis. There is no evidence of arthropathy or other focal bone abnormality. Soft tissues are unremarkable. IMPRESSION: Subtle acute nondisplaced fracture along the dorsal distal radial metaphysis. Electronically Signed   By: Elige Ko   On: 05/23/2019 10:42    Procedures Procedures (including critical care time)  Medications Ordered in ED Medications  HYDROcodone-acetaminophen (NORCO/VICODIN) 5-325 MG per tablet 1 tablet (1 tablet Oral Given 05/23/19  1159)    ED Course  I have reviewed the triage vital signs and the nursing notes.  Pertinent labs & imaging results that were available during my care of the patient were reviewed by me and considered in my medical decision making (see chart for details).    MDM Rules/Calculators/A&P                     Patient presents with right wrist injury after she fell on the outstretched hand.  Swelling noted without significant deformity.  Neurovascularly intact.  X-ray shows a acute nondisplaced fracture along the dorsal distal radial metaphysis.  Patient has pain with pronation and supination and range of motion of the wrist is limited due to pain but she has full range of motion of the elbow and shoulder.  Discussed with [facility] and will place patient in a sugar tong splint for more support with rotation.  Will have patient follow-up with Dr. Amanda Pea with hand surgery.  Encouraged ice and elevation.  Will treat with Norco, ibuprofen and Tylenol.  Follow-up and return precautions discussed.  Patient expresses understanding and agreement.  Discharged home in good condition.  Final Clinical Impression(s) / ED Diagnoses Final diagnoses:  Closed fracture of distal end of right radius, unspecified fracture morphology, initial encounter    Rx / DC Orders ED Discharge Orders         Ordered    HYDROcodone-acetaminophen (NORCO) 5-325 MG tablet  Every 6 hours PRN     05/23/19 1210    ibuprofen (ADVIL) 600 MG tablet  Every 6 hours PRN     05/23/19 1210           Dartha Lodge, PA-C 05/23/19 1210    Long,  W[facility], MD 05/24/19 [phone]

## 2019-05-23 NOTE — Discharge Instructions (Signed)
You have a fracture of your distal radius noted on x-ray today.  You will need to remain in splint until you are seen by Dr. Amanda Pea with hand surgery.  Ice and elevate the wrist to help reduce swelling and pain.  Use prescribed Norco as well as 600 mg ibuprofen every 6 hours as needed for pain.  Norco can cause drowsiness do not take before driving.  Return for any new or worsening symptoms.

## 2019-05-23 NOTE — ED Triage Notes (Signed)
Patient states she was wearing new shoes and fell yesterday. Today the patient c/o right wrist pain and swelling.

## 2019-05-23 NOTE — ED Notes (Signed)
Pt verbalizes understanding of DC instructions. Pt belongings returned and is ambulatory out of ED.  

## 2019-09-23 ENCOUNTER — Encounter: Payer: Self-pay | Admitting: *Deleted

## 2019-09-23 ENCOUNTER — Ambulatory Visit
Admission: EM | Admit: 2019-09-23 | Discharge: 2019-09-23 | Disposition: A | Discharge status: Home / Self Care | Payer: 59

## 2019-09-23 ENCOUNTER — Other Ambulatory Visit: Payer: Self-pay

## 2019-09-23 DIAGNOSIS — M5442 Lumbago with sciatica, left side: Secondary | ICD-10-CM | POA: Diagnosis not present

## 2019-09-23 MED ORDER — CYCLOBENZAPRINE HCL 5 MG PO TABS: 5.0000 mg | ORAL_TABLET | Freq: Two times a day (BID) | ORAL | 0 refills | Status: AC | PRN

## 2019-09-23 MED ORDER — METHYLPREDNISOLONE SODIUM SUCC 125 MG IJ SOLR
125.0000 mg | Freq: Once | INTRAMUSCULAR | Status: AC
  Administered 2019-09-23: 125 mg via INTRAMUSCULAR

## 2019-09-23 MED ORDER — NAPROXEN 500 MG PO TABS: 500.0000 mg | ORAL_TABLET | Freq: Two times a day (BID) | ORAL | 0 refills | Status: AC

## 2019-09-23 NOTE — ED Triage Notes (Signed)
Denies injury.  Reports waking up yesterday when she noticed left low back & hip burning pain radiating down to left knee.  States feels like "knee gives out".  C/O slight numbness to left foot at times.  Denies any urinary or bowel incontinence.

## 2019-09-23 NOTE — ED Provider Notes (Signed)
[facility]    CSN: [id number] Arrival date & time: 09/23/19  1256      History   Chief Complaint Chief Complaint  Patient presents with  . Back Pain  . Hip Pain    HPI Dawn Atkinson is a 49 y.o. female with history of sickle cell trait, interstitial cystitis, endometriosis, asthma presenting for left low back and hip pain.  Patient states pain radiates down to left knee since yesterday.  Has tried hydrocodone, ibuprofen without relief.  Denies trauma/injury to the affected area and does not recall an inciting event.  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  Denies fever, saddle area anesthesia, lower extremity numbness/weakness, urinary retention, fecal incontinence.   Past Medical History:  Diagnosis Date  . Asthma   . Endometriosis   . Headache(784.0)   . Heart murmur   . Hypertension   . Interstitial cystitis   . Sickle cell trait (HCC)   . Vitamin D insufficiency     Patient Active Problem List   Diagnosis Date Noted  . Pelvic pain in female 08/31/2017  . Urinary frequency 08/31/2017  . Asthma 10/18/2010  . H/O: hysterectomy 10/18/2010  . Cystitis, interstitial 10/18/2010  . Constipation 10/18/2010    Past Surgical History:  Procedure Laterality Date  . ABDOMINAL HYSTERECTOMY    . ANKLE SURGERY    . BARTHOLIN GLAND CYST EXCISION    . COLONOSCOPY    . CYSTECTOMY    . DILATION AND CURETTAGE OF UTERUS    . EYE SURGERY    . INGUINAL HERNIA REPAIR    . LAPAROSCOPY    . UPPER GI ENDOSCOPY      OB History    Gravida  3   Para  1   Term      Preterm  0   AB  2   Living  1     SAB  1   TAB  1   Ectopic      Multiple      Live Births  1            Home Medications    Prior to Admission medications   Medication Sig Start Date End Date Taking? Authorizing Provider  cetirizine (ZYRTEC) 10 MG tablet Take 10 mg by mouth daily. For allergies   Yes [provider]  co-enzyme Q-10 30 MG capsule Take 30 mg by mouth daily.   Yes [provider]  Cod Liver Oil CAPS Take 1 capsule by mouth daily.   Yes [provider]  docusate sodium (COLACE) 100 MG capsule Take 100 mg by mouth daily as needed for mild constipation.   Yes [provider]  losartan (COZAAR) 50 MG tablet Take 50 mg by mouth daily.    Yes [provider]  valACYclovir HCl (VALTREX PO) Take by mouth.   Yes [provider]  VITAMIN E PO Take 1 capsule by mouth every other day.   Yes [provider]  albuterol (PROVENTIL HFA;VENTOLIN HFA) 108 (90 BASE) MCG/ACT inhaler Inhale 2 puffs into the lungs every 6 (six) hours as needed for wheezing or shortness of breath. 03/07/15   Dowless, Lelon Mast Tripp, PA-C  albuterol (PROVENTIL HFA;VENTOLIN HFA) 108 (90 Base) MCG/ACT inhaler Inhale 1-2 puffs into the lungs every 6 (six) hours as needed for wheezing or shortness of breath. 03/25/18   Jeannie Fend, PA-C  albuterol (PROVENTIL) (2.5 MG/3ML)  0.083% nebulizer solution Take 3 mLs (2.5 mg total) by nebulization every 6 (six) hours as needed for wheezing or shortness of breath. 03/07/15   Dowless, Lelon Mast Tripp, PA-C  atorvastatin (LIPITOR) 20 MG tablet Take 20 mg by mouth daily. 09/15/17   [provider]  cyclobenzaprine (FLEXERIL) 5 MG tablet Take 1 tablet (5 mg total) by mouth 2 (two) times daily as needed for up to 5 days for muscle spasms. 09/23/19 09/28/19  Hall-Potvin, [place], PA-C  HYDROcodone-acetaminophen (NORCO) 5-325 MG tablet Take 1 tablet by mouth every 6 (six) hours as needed. 05/23/19   Dartha Lodge, PA-C  ibuprofen (ADVIL) 600 MG tablet Take 1 tablet (600 mg total) by mouth every 6 (six) hours as needed. 05/23/19   Dartha Lodge, PA-C  Meth-Hyo-M Bl-Na Phos-Ph Sal (URIBEL) 118 MG CAPS Take 1 capsule by mouth daily as needed (urinary pain).     [provider]  mometasone-formoterol (DULERA) 100-5 MCG/ACT AERO  Inhale 2 puffs into the lungs 2 (two) times daily as needed.    [provider]  naproxen (NAPROSYN) 500 MG tablet Take 1 tablet (500 mg total) by mouth 2 (two) times daily. 09/23/19   Hall-Potvin, [place], PA-C  triamcinolone cream (KENALOG) 0.1 % Apply 1 application topically 3 (three) times daily as needed (eczema.).     [provider]    Family History Family History  Problem Relation Age of Onset  . Hypertension Mother   . Fibromyalgia Mother   . Hypertension Father   . Vitamin D deficiency Father   . Alzheimer's disease Maternal Grandmother   . Prostate cancer Maternal Grandfather   . Cirrhosis Paternal Grandmother   . Hypertension Paternal Grandmother   . Pneumonia Paternal Grandfather   . Hypertension Paternal Grandfather   . GER disease Brother   . Hypertension Sister   . Anxiety disorder Sister   . Asthma Child   . Pulmonary Hypertension Child   . Gout Child     Social History Social History   Tobacco Use  . Smoking status: Current Some Day Smoker    Packs/day: 0.25    Types: Cigarettes  . Smokeless tobacco: Never Used  . Tobacco comment: 1-2 cig  Substance Use Topics  . Alcohol use: Yes    Comment: socially  . Drug use: No     Allergies   Amoxicillin-pot clavulanate, Avelox [moxifloxacin hydrochloride], Hydrochlorothiazide, Erythromycin, Penicillins, Aspirin, Gabapentin, Lisinopril, Oxycodone, Sulfa antibiotics, and Tessalon [benzonatate]   Review of Systems As per HPI   Physical Exam Triage Vital Signs ED Triage Vitals  Enc Vitals Group     BP      Pulse      Resp      Temp      Temp src      SpO2      Weight      Height      Head Circumference      Peak Flow      Pain Score      Pain Loc      Pain Edu?      Excl. in GC?    No data found.  Updated Vital Signs BP (!) 157/100 (BP Location: Left Arm)   Pulse 93   Temp 98.8 F (37.1 C) (Oral)   Resp 18   SpO2 97%   Visual Acuity Right Eye Distance:   Left Eye  Distance:   Bilateral Distance:    Right Eye Near:   Left Eye Near:  Bilateral Near:     Physical Exam Constitutional:      General: Dawn Atkinson is not in acute distress.    Appearance: Dawn Atkinson is obese. Dawn Atkinson is not ill-appearing.  HENT:     Head: Normocephalic and atraumatic.  Eyes:     General: No scleral icterus.    Pupils: Pupils are equal, round, and reactive to light.  Cardiovascular:     Rate and Rhythm: Normal rate.  Pulmonary:     Effort: Pulmonary effort is normal.  Musculoskeletal:     Comments: Decreased ROM in all directions of lumbar spine and left hip secondary pain.  Patient endorsing diffuse left lumbar tenderness without bony deformity, crepitus, edema.  No spasm appreciated.  Exam limited secondary cooperation due to pain.  Skin:    Coloration: Skin is not jaundiced or pale.     Findings: No rash.  Neurological:     General: No focal deficit present.     Mental Status: Dawn Atkinson is alert and oriented to person, place, and time.      UC Treatments / Results  Labs (all labs ordered are listed, but only abnormal results are displayed) Labs Reviewed - No data to display  EKG   Radiology No results found.  Procedures Procedures (including critical care time)  Medications Ordered in UC Medications  methylPREDNISolone sodium succinate (SOLU-MEDROL) 125 mg/2 mL injection 125 mg (has no administration in time range)    Initial Impression / Assessment and Plan / UC Course  I have reviewed the triage vital signs and the nursing notes.  Pertinent labs & imaging results that were available during my care of the patient were reviewed by me and considered in my medical decision making (see chart for details).     Patient afebrile, nontoxic in office today.  H&P consistent with left low back pain with left-sided sciatica.  Provided Solu-Medrol injection in office which he tolerated well.  We will treat supportively as outlined below.  Provided contact information for  orthopedics for further evaluation if needed.  Return precautions discussed, patient verbalized understanding and is agreeable to plan. Final Clinical Impressions(s) / UC Diagnoses   Final diagnoses:  Acute left-sided low back pain with left-sided sciatica     Discharge Instructions     Recommend RICE: rest, ice, compression, elevation as needed for pain.    Heat therapy (hot compress, warm wash rag, hot showers, etc.) can help relax muscles and soothe muscle aches. Cold therapy (ice packs) can be used to help swelling both after injury and after prolonged use of areas of chronic pain/aches.  For pain: naproxen twice daily with food.  May take Tylenol additionally if needed.  May take muscle relaxer as needed for severe pain / spasm.  (This medication may cause you to become tired so it is important you do not drink alcohol or operate heavy machinery while on this medication.  Recommend your first dose to be taken before bedtime to monitor for side effects safely)    ED Prescriptions    Medication Sig Dispense Auth. Provider   naproxen (NAPROSYN) 500 MG tablet Take 1 tablet (500 mg total) by mouth 2 (two) times daily. 30 tablet Hall-Potvin, [place], PA-C   cyclobenzaprine (FLEXERIL) 5 MG tablet Take 1 tablet (5 mg total) by mouth 2 (two) times daily as needed for up to 5 days for muscle spasms. 10 tablet Hall-Potvin, [place], PA-C     I have reviewed the PDMP during this encounter.   Hall-Potvin, [place], [place] 09/23/19  1324  

## 2019-09-23 NOTE — Discharge Instructions (Addendum)
Recommend RICE: rest, ice, compression, elevation as needed for pain.    Heat therapy (hot compress, warm wash rag, hot showers, etc.) can help relax muscles and soothe muscle aches. Cold therapy (ice packs) can be used to help swelling both after injury and after prolonged use of areas of chronic pain/aches.  For pain: naproxen twice daily with food.  May take Tylenol additionally if needed.  May take muscle relaxer as needed for severe pain / spasm.  (This medication may cause you to become tired so it is important you do not drink alcohol or operate heavy machinery while on this medication.  Recommend your first dose to be taken before bedtime to monitor for side effects safely)

## 2020-01-25 ENCOUNTER — Ambulatory Visit: Payer: 59 | Admitting: Neurology

## 2020-01-25 ENCOUNTER — Encounter: Payer: Self-pay | Admitting: Neurology

## 2020-01-25 VITALS — BP 146/94 | HR 72 | Ht 66.0 in | Wt 185.0 lb

## 2020-01-25 DIAGNOSIS — R351 Nocturia: Secondary | ICD-10-CM

## 2020-01-25 DIAGNOSIS — R0681 Apnea, not elsewhere classified: Secondary | ICD-10-CM | POA: Diagnosis not present

## 2020-01-25 DIAGNOSIS — G4719 Other hypersomnia: Secondary | ICD-10-CM

## 2020-01-25 DIAGNOSIS — R0683 Snoring: Secondary | ICD-10-CM | POA: Diagnosis not present

## 2020-01-25 DIAGNOSIS — E663 Overweight: Secondary | ICD-10-CM

## 2020-01-25 DIAGNOSIS — R519 Headache, unspecified: Secondary | ICD-10-CM

## 2020-01-25 DIAGNOSIS — J351 Hypertrophy of tonsils: Secondary | ICD-10-CM

## 2020-01-25 DIAGNOSIS — Z820 Family history of epilepsy and other diseases of the nervous system: Secondary | ICD-10-CM

## 2020-01-25 NOTE — Progress Notes (Signed)
Subjective:    Patient ID: Dawn Atkinson is a 49 y.o. female.  HPI     Huston Foley, MD, PhD [facility] [address] [address] [place], [place] [address]  Dear Dawn Atkinson,   I saw your patient, Dawn Atkinson, upon your kind request, in my Sleep clinic today for initial consultation of her sleep disorder, in particular, concern for underlying obstructive sleep apnea.  The patient is unaccompanied today.  As you know, Dawn Atkinson is a 49 year old right-handed woman with an underlying medical history of sickle cell trait, interstitial cystitis, hypertension, heart murmur, endometriosis, asthma, vitamin D deficiency and overweight state, who reports snoring and excessive daytime somnolence.  I reviewed your office note from 11/24/2019.  Her Epworth sleepiness score is 15 out of 24 today, fatigue severity score is 50 out of 63. Her boyfriend has noted apneic pauses while she is asleep. She has had palpitations at times. She has woken up with a headache at times. She does have nocturia but also has a history of interstitial cystitis. Bedtime is generally around 6 or 7 PM as she has to get up early for work. Rise time is around 3 AM. She has to be at work typically at 4:45 AM. 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 Her mother has sleep apnea and has a CPAP machine. Patient limits her caffeine to 1 serving per day on average and drinks alcohol occasionally, she is an occasional/social smoker. She has a history of bruxism and has talked to her dentist about it. She also reports that her dentist treats sleep apnea. She has seen an allergy specialist. She often wakes up with swelling in her face. She has noticed swelling in her tonsils and uvula at times. She had seen ENT before for her tonsillar hypertrophy but tonsillectomy was not recommended per se but considered as a possibility if needed.  Her Past Medical History Is  Significant For: Past Medical History:  Diagnosis Date  . Asthma   . Endometriosis   . Headache(784.0)   . Heart murmur   . Hypertension   . Interstitial cystitis   . Sickle cell trait (HCC)   . Vitamin D insufficiency     Her Past Surgical History Is Significant For: Past Surgical History:  Procedure Laterality Date  . ABDOMINAL HYSTERECTOMY    . ANKLE SURGERY    . BARTHOLIN GLAND CYST EXCISION    . COLONOSCOPY    . CYSTECTOMY    . DILATION AND CURETTAGE OF UTERUS    . EYE SURGERY    . INGUINAL HERNIA REPAIR    . LAPAROSCOPY    . UPPER GI ENDOSCOPY      Her Family History Is Significant For: Family History  Problem Relation Age of Onset  . Hypertension Mother   . Fibromyalgia Mother   . Hypertension Father   . Vitamin D deficiency Father   . Alzheimer's disease Maternal Grandmother   . Prostate cancer Maternal Grandfather   . Cirrhosis Paternal Grandmother   . Hypertension Paternal Grandmother   . Pneumonia Paternal Grandfather   . Hypertension Paternal Grandfather   . GER disease Brother   . Hypertension Sister   . Anxiety disorder Sister   . Asthma Child   . Pulmonary Hypertension Child   . Gout Child     Her Social History Is Significant For: Social History   Socioeconomic History  . Marital  status: Divorced    Spouse name: Not on file  . Number of children: Not on file  . Years of education: Not on file  . Highest education level: Not on file  Occupational History  . Not on file  Tobacco Use  . Smoking status: Current Some Day Smoker    Packs/day: 0.25    Types: Cigarettes  . Smokeless tobacco: Never Used  . Tobacco comment: 1-2 cig  Vaping Use  . Vaping Use: Never used  Substance and Sexual Activity  . Alcohol use: Yes    Comment: socially  . Drug use: No  . Sexual activity: Not Currently    Birth control/protection: Surgical  Other Topics Concern  . Not on file  Social History Narrative  . Not on file   Social Determinants of  Health   Financial Resource Strain:   . Difficulty of Paying Living Expenses: Not on file  Food Insecurity:   . Worried About Programme researcher, broadcasting/film/videounning Out of Food in the Last Year: Not on file  . Ran Out of Food in the Last Year: Not on file  Transportation Needs:   . Lack of Transportation (Medical): Not on file  . Lack of Transportation (Non-Medical): Not on file  Physical Activity:   . Days of Exercise per Week: Not on file  . Minutes of Exercise per Session: Not on file  Stress:   . Feeling of Stress : Not on file  Social Connections:   . Frequency of Communication with Friends and Family: Not on file  . Frequency of Social Gatherings with Friends and Family: Not on file  . Attends Religious Services: Not on file  . Active Member of Clubs or Organizations: Not on file  . Attends BankerClub or Organization Meetings: Not on file  . Marital Status: Not on file    Her Allergies Are:  Allergies  Allergen Reactions  . Amoxicillin-Pot Clavulanate Anaphylaxis    Has patient had a PCN reaction causing immediate rash, facial/tongue/throat swelling, SOB or lightheadedness with hypotension: Unknown.  Pt thinks, but cannot confirm rash.  Has patient had a PCN reaction causing severe rash involving mucus membranes or skin necrosis: Unknown Has patient had a PCN reaction that required hospitalization No Has patient had a PCN reaction occurring within the last 10 years: No If all of the above answers are "NO", then may proceed with Cephalosporin use.   . Avelox [Moxifloxacin Hydrochloride] Anaphylaxis  . Hydrochlorothiazide Anaphylaxis and Other (See Comments)    hypotension  . Erythromycin Other (See Comments)    Unknown  . Penicillins   . Aspirin Diarrhea    childhood  . Gabapentin Other (See Comments)    hallucinations   . Lisinopril Other (See Comments)    Induced patient asthma  . Oxycodone Hives    "tries to dig skin out"  . Sulfa Antibiotics Hives  . Tessalon [Benzonatate] Other (See Comments)     WHOOPING COUGH   :   Her Current Medications Are:  Outpatient Encounter Medications as of 01/25/2020  Medication Sig  . albuterol (PROVENTIL HFA;VENTOLIN HFA) 108 (90 BASE) MCG/ACT inhaler Inhale 2 puffs into the lungs every 6 (six) hours as needed for wheezing or shortness of breath.  Marland Kitchen. albuterol (PROVENTIL HFA;VENTOLIN HFA) 108 (90 Base) MCG/ACT inhaler Inhale 1-2 puffs into the lungs every 6 (six) hours as needed for wheezing or shortness of breath.  Marland Kitchen. albuterol (PROVENTIL) (2.5 MG/3ML) 0.083% nebulizer solution Take 3 mLs (2.5 mg total) by nebulization every 6 (six)  hours as needed for wheezing or shortness of breath.  . cetirizine (ZYRTEC) 10 MG tablet Take 10 mg by mouth daily. For allergies  . co-enzyme Q-10 30 MG capsule Take 30 mg by mouth daily.  Marland Kitchen Cod Liver Oil CAPS Take 1 capsule by mouth daily.  Marland Kitchen docusate sodium (COLACE) 100 MG capsule Take 100 mg by mouth daily as needed for mild constipation.  Marland Kitchen HYDROcodone-acetaminophen (NORCO) 5-325 MG tablet Take 1 tablet by mouth every 6 (six) hours as needed.  Marland Kitchen ibuprofen (ADVIL) 600 MG tablet Take 1 tablet (600 mg total) by mouth every 6 (six) hours as needed.  Marland Kitchen losartan (COZAAR) 50 MG tablet Take 100 mg by mouth daily.   . Meth-Hyo-M Bl-Na Phos-Ph Sal (URIBEL) 118 MG CAPS Take 1 capsule by mouth daily as needed (urinary pain).   . mometasone-formoterol (DULERA) 100-5 MCG/ACT AERO Inhale 2 puffs into the lungs 2 (two) times daily as needed.  . naproxen (NAPROSYN) 500 MG tablet Take 1 tablet (500 mg total) by mouth 2 (two) times daily.  Marland Kitchen triamcinolone cream (KENALOG) 0.1 % Apply 1 application topically 3 (three) times daily as needed (eczema.).   Marland Kitchen valACYclovir HCl (VALTREX PO) Take by mouth.  Marland Kitchen VITAMIN E PO Take 1 capsule by mouth every other day.  . [DISCONTINUED] atorvastatin (LIPITOR) 20 MG tablet Take 20 mg by mouth daily.   No facility-administered encounter medications on file as of 01/25/2020.  :  Review of Systems:  Out  of a complete 14 point review of systems, all are reviewed and negative with the exception of these symptoms as listed below: Review of Systems  Neurological:       Here for sleep consult. Prior sleep study completed several years ago, osa was not severe enough to treat.  Pt here to reassess. Pt reports daytime sleepiness/fatigue is severe. Snoring is present.  Epworth Sleepiness Scale 0= would never doze 1= slight chance of dozing 2= moderate chance of dozing 3= high chance of dozing  Sitting and reading:3 Watching TV:3 Sitting inactive in a public place (ex. Theater or meeting):2 As a passenger in a car for an hour without a break:1 Lying down to rest in the afternoon:3 Sitting and talking to someone:1 Sitting quietly after lunch (no alcohol):1 In a car, while stopped in traffic:1 Total:15     Objective:  Neurological Exam  Physical Exam Physical Examination:   Vitals:   01/25/20 1106  BP: (!) 146/94  Pulse: 72  SpO2: 97%    General Examination: The patient is a very pleasant 49 y.o. female in no acute distress. She appears well-developed and well-nourished and well groomed.   HEENT: Normocephalic, atraumatic, pupils are equal, round and reactive to light, extraocular tracking is good without limitation to gaze excursion or nystagmus noted. Hearing is grossly intact. Face is symmetric with normal facial animation. Speech is clear with no dysarthria noted. There is no hypophonia. There is no lip, neck/head, jaw or voice tremor. Neck is supple with full range of passive and active motion. There are no carotid bruits on auscultation. Oropharynx exam reveals: mild mouth dryness, good dental hygiene and moderate airway crowding, due to small airway entry, tonsillar size of about 3+ bilaterally, slightly longer uvula. Mallampati class II. Neck circumference of 14-1/4 inches. She has a mild overbite. Tongue protrudes centrally and palate elevates symmetrically.  Chest: Clear to  auscultation without wheezing, rhonchi or crackles noted.  Heart: S1+S2+0, regular and normal without murmurs, rubs or gallops noted.  Abdomen: Soft, non-tender and non-distended with normal bowel sounds appreciated on auscultation.  Extremities: There is nonpitting puffiness noted around both ankles.   Skin: Warm and dry without trophic changes noted. Scarring noted lateral ankles bilaterally, left worse than right with hyperpigmentation noted. She reports eczema and scratching.  Musculoskeletal: exam reveals no obvious joint deformities, tenderness or joint swelling or erythema.   Neurologically:  Mental status: The patient is awake, alert and oriented in all 4 spheres. Her immediate and remote memory, attention, language skills and fund of knowledge are appropriate. There is no evidence of aphasia, agnosia, apraxia or anomia. Speech is clear with normal prosody and enunciation. Thought process is linear. Mood is normal and affect is normal.  Cranial nerves II - XII are as described above under HEENT exam.  Motor exam: Normal bulk, strength and tone is noted. There is no tremor, Romberg is negative. Fine motor skills and coordination: grossly intact.  Cerebellar testing: No dysmetria or intention tremor. There is no truncal or gait ataxia.  Sensory exam: intact to light touch in the upper and lower extremities.  Gait, station and balance: She stands easily. No veering to one side is noted. No leaning to one side is noted. Posture is age-appropriate and stance is narrow based. Gait shows normal stride length and normal pace. No problems turning are noted. Tandem walk is unremarkable.                Assessment and Plan:  In summary, Mersades Barbaro Diliberto is a very pleasant 49 y.o.-year old female with an underlying medical history of sickle cell trait, interstitial cystitis, hypertension, heart murmur, endometriosis, asthma, vitamin D deficiency and overweight state, whose history and physical exam  are concerning for obstructive sleep apnea (OSA). I had a long chat with the patient about my findings and the diagnosis of OSA, its prognosis and treatment options. We talked about medical treatments, surgical interventions and non-pharmacological approaches. I explained in particular the risks and ramifications of untreated moderate to severe OSA, especially with respect to developing cardiovascular disease down the Road, including congestive heart failure, difficult to treat hypertension, cardiac arrhythmias, or stroke. Even type 2 diabetes has, in part, been linked to untreated OSA. Symptoms of untreated OSA include daytime sleepiness, memory problems, mood irritability and mood disorder such as depression and anxiety, lack of energy, as well as recurrent headaches, especially morning headaches. We talked about smoking cessation and trying to maintain a healthy lifestyle in general, as well as the importance of weight control. We also talked about the importance of good sleep hygiene. I recommended the following at this time: sleep study. I explained the sleep test procedure to the patient and also outlined possible surgical and non-surgical treatment options of OSA, including the use of a custom-made dental device (which would require a referral to a specialist dentist or oral surgeon), upper airway surgical options, such as traditional UPPP or a novel less invasive surgical option in the form of Inspire hypoglossal nerve stimulation (which would involve a referral to an ENT surgeon). I also explained the CPAP treatment option to the patient, who indicated that she would be willing to try CPAP if the need arises. I answered all her questions today and the patient was in agreement. I plan to see her back after the sleep study is completed and encouraged her to call with any interim questions, concerns, problems or updates.   Thank you very much for allowing me to participate in the care of  this nice patient.  If I can be of any further assistance to you please do not hesitate to call me at [phone].  Sincerely,   Huston Foley, MD, PhD

## 2020-01-25 NOTE — Patient Instructions (Signed)

## 2020-02-06 ENCOUNTER — Ambulatory Visit (INDEPENDENT_AMBULATORY_CARE_PROVIDER_SITE_OTHER): Payer: 59

## 2020-02-06 ENCOUNTER — Other Ambulatory Visit: Payer: Self-pay

## 2020-02-06 ENCOUNTER — Ambulatory Visit: Payer: 59 | Admitting: Podiatry

## 2020-02-06 DIAGNOSIS — M722 Plantar fascial fibromatosis: Secondary | ICD-10-CM | POA: Diagnosis not present

## 2020-02-06 DIAGNOSIS — M79671 Pain in right foot: Secondary | ICD-10-CM | POA: Diagnosis not present

## 2020-02-06 DIAGNOSIS — M79672 Pain in left foot: Secondary | ICD-10-CM

## 2020-02-06 DIAGNOSIS — M2142 Flat foot [pes planus] (acquired), left foot: Secondary | ICD-10-CM

## 2020-02-06 DIAGNOSIS — G629 Polyneuropathy, unspecified: Secondary | ICD-10-CM

## 2020-02-06 DIAGNOSIS — M2141 Flat foot [pes planus] (acquired), right foot: Secondary | ICD-10-CM | POA: Diagnosis not present

## 2020-02-06 MED ORDER — IBUPROFEN 800 MG PO TABS: 800.0000 mg | ORAL_TABLET | Freq: Three times a day (TID) | ORAL | 0 refills | Status: AC | PRN

## 2020-02-07 LAB — COMPLETE METABOLIC PANEL WITH GFR
Albumin: 4.4 g/dL (ref 3.6–5.1)
Chloride: 106 mmol/L (ref 98–110)
Creat: 0.76 mg/dL (ref 0.50–1.10)

## 2020-02-07 LAB — TSH: TSH: 0.86 mIU/L

## 2020-02-08 LAB — PROTEIN ELECTROPHORESIS, SERUM
Albumin ELP: 4.4 g/dL (ref 3.8–4.8)
Alpha 1: 0.3 g/dL (ref 0.2–0.3)
Alpha 2: 0.7 g/dL (ref 0.5–0.9)
Beta 2: 0.4 g/dL (ref 0.2–0.5)
Beta Globulin: 0.5 g/dL (ref 0.4–0.6)
Gamma Globulin: 0.9 g/dL (ref 0.8–1.7)
Total Protein: 7.1 g/dL (ref 6.1–8.1)

## 2020-02-08 LAB — C-REACTIVE PROTEIN: CRP: 2.6 mg/L (ref <8.0)

## 2020-02-08 LAB — COMPLETE METABOLIC PANEL WITH GFR
AG Ratio: 1.7 (calc) (ref 1.0–2.5)
ALT: 10 U/L (ref 6–29)
AST: 15 U/L (ref 10–35)
Alkaline phosphatase (APISO): 105 U/L (ref 31–125)
BUN: 15 mg/dL (ref 7–25)
CO2: 26 mmol/L (ref 20–32)
Calcium: 10 mg/dL (ref 8.6–10.2)
GFR, Est African American: 107 mL/min/{1.73_m2} (ref >=60)
GFR, Est Non African American: 92 mL/min/{1.73_m2} (ref >=60)
Globulin: 2.6 g/dL (calc) (ref 1.9–3.7)
Glucose, Bld: 96 mg/dL (ref 65–139)
Potassium: 4.3 mmol/L (ref 3.5–5.3)
Sodium: 140 mmol/L (ref 135–146)
Total Bilirubin: 0.4 mg/dL (ref 0.2–1.2)
Total Protein: 7 g/dL (ref 6.1–8.1)

## 2020-02-08 LAB — SEDIMENTATION RATE: Sed Rate: 11 mm/h (ref 0–20)

## 2020-02-08 LAB — VITAMIN D 25 HYDROXY (VIT D DEFICIENCY, FRACTURES): Vit D, 25-Hydroxy: 24 ng/mL — ABNORMAL LOW (ref 30–100)

## 2020-02-08 LAB — VITAMIN B12: Vitamin B-12: 269 pg/mL (ref 200–1100)

## 2020-02-09 ENCOUNTER — Other Ambulatory Visit: Payer: Self-pay | Admitting: Podiatry

## 2020-02-09 MED ORDER — VITAMIN D (ERGOCALCIFEROL) 1.25 MG (50000 UNIT) PO CAPS: 50000.0000 [IU] | ORAL_CAPSULE | ORAL | 0 refills | Status: AC

## 2020-02-09 NOTE — Progress Notes (Signed)
Subjective:   Patient ID: Dawn Atkinson, female   DOB: 49 y.o.   MRN: [id number]   HPI 49 year old female presents the office today for concerns of flatfeet as well as pain to both of her heels.  She describes a burning sensations to the palms of both of her feet.  This is now been ongoing for a couple of years.  She has been tested for diabetes exams been within normal limits and she is not sure why she is starting to her feet.  She also has a history of toe fractures but no recent injury.  She occasionally get tenderness to the big toes as well.  Denies any weakness or falls.  No other concerns today.   Review of Systems  All other systems reviewed and are negative.  Past Medical History:  Diagnosis Date  . Asthma   . Endometriosis   . Headache(784.0)   . Heart murmur   . Hypertension   . Interstitial cystitis   . Sickle cell trait (HCC)   . Vitamin D insufficiency     Past Surgical History:  Procedure Laterality Date  . ABDOMINAL HYSTERECTOMY    . ANKLE SURGERY    . BARTHOLIN GLAND CYST EXCISION    . COLONOSCOPY    . CYSTECTOMY    . DILATION AND CURETTAGE OF UTERUS    . EYE SURGERY    . INGUINAL HERNIA REPAIR    . LAPAROSCOPY    . UPPER GI ENDOSCOPY       Current Outpatient Medications:  .  albuterol (PROVENTIL HFA;VENTOLIN HFA) 108 (90 BASE) MCG/ACT inhaler, Inhale 2 puffs into the lungs every 6 (six) hours as needed for wheezing or shortness of breath., Disp: 1 Inhaler, Rfl: 0 .  albuterol (PROVENTIL HFA;VENTOLIN HFA) 108 (90 Base) MCG/ACT inhaler, Inhale 1-2 puffs into the lungs every 6 (six) hours as needed for wheezing or shortness of breath., Disp: 1 Inhaler, Rfl: 0 .  albuterol (PROVENTIL) (2.5 MG/3ML) 0.083% nebulizer solution, Take 3 mLs (2.5 mg total) by nebulization every 6 (six) hours as needed for wheezing or shortness of breath., Disp: 75 mL, Rfl: 12 .  cetirizine (ZYRTEC) 10 MG tablet, Take 10 mg by mouth daily. For allergies, Disp: , Rfl:  .  co-enzyme  Q-10 30 MG capsule, Take 30 mg by mouth daily., Disp: , Rfl:  .  Cod Liver Oil CAPS, Take 1 capsule by mouth daily., Disp: , Rfl:  .  docusate sodium (COLACE) 100 MG capsule, Take 100 mg by mouth daily as needed for mild constipation., Disp: , Rfl:  .  HYDROcodone-acetaminophen (NORCO) 5-325 MG tablet, Take 1 tablet by mouth every 6 (six) hours as needed., Disp: 10 tablet, Rfl: 0 .  ibuprofen (ADVIL) 600 MG tablet, Take 1 tablet (600 mg total) by mouth every 6 (six) hours as needed., Disp: 30 tablet, Rfl: 0 .  ibuprofen (ADVIL) 800 MG tablet, Take 1 tablet (800 mg total) by mouth every 8 (eight) hours as needed., Disp: 30 tablet, Rfl: 0 .  losartan (COZAAR) 50 MG tablet, Take 100 mg by mouth daily. , Disp: , Rfl:  .  Meth-Hyo-M Bl-Na Phos-Ph Sal (URIBEL) 118 MG CAPS, Take 1 capsule by mouth daily as needed (urinary pain). , Disp: , Rfl:  .  mometasone-formoterol (DULERA) 100-5 MCG/ACT AERO, Inhale 2 puffs into the lungs 2 (two) times daily as needed., Disp: , Rfl:  .  naproxen (NAPROSYN) 500 MG tablet, Take 1 tablet (500 mg total) by mouth 2 (  two) times daily., Disp: 30 tablet, Rfl: 0 .  triamcinolone cream (KENALOG) 0.1 %, Apply 1 application topically 3 (three) times daily as needed (eczema.). , Disp: , Rfl:  .  valACYclovir HCl (VALTREX PO), Take by mouth., Disp: , Rfl:  .  VITAMIN E PO, Take 1 capsule by mouth every other day., Disp: , Rfl:   Allergies  Allergen Reactions  . Amoxicillin-Pot Clavulanate Anaphylaxis    Has patient had a PCN reaction causing immediate rash, facial/tongue/throat swelling, SOB or lightheadedness with hypotension: Unknown.  Pt thinks, but cannot confirm rash.  Has patient had a PCN reaction causing severe rash involving mucus membranes or skin necrosis: Unknown Has patient had a PCN reaction that required hospitalization No Has patient had a PCN reaction occurring within the last 10 years: No If all of the above answers are "NO", then may proceed with  Cephalosporin use.   . Avelox [Moxifloxacin Hydrochloride] Anaphylaxis  . Hydrochlorothiazide Anaphylaxis and Other (See Comments)    hypotension  . Erythromycin Other (See Comments)    Unknown  . Penicillins   . Aspirin Diarrhea    childhood  . Gabapentin Other (See Comments)    hallucinations   . Lisinopril Other (See Comments)    Induced patient asthma  . Oxycodone Hives    "tries to dig skin out"  . Sulfa Antibiotics Hives  . Tessalon [Benzonatate] Other (See Comments)    WHOOPING COUGH         Objective:  Physical Exam  General: AAO x3, NAD  Dermatological: Skin is warm, dry and supple bilateral.  There are no open sores, no preulcerative lesions, no rash or signs of infection present.  Vascular: Dorsalis Pedis artery and Posterior Tibial artery pedal pulses are 2/4 bilateral with immedate capillary fill time. There is no pain with calf compression, swelling, warmth, erythema.   Neruologic: Sensation intact with Phoebe Perch monofilament.  Negative Tinel sign bilaterally.  Musculoskeletal: There is a decrease in medial arch arch upon weightbearing bilaterally.  There is tenderness to palpation along the course of the insertion of the plantar fascia.  There is no pain with lateral compression of calcaneus there is no area of pinpoint tenderness.  No significant edema bilaterally.  No pain of the toes today.  Muscular strength 5/5 in all groups tested bilateral.  Gait: Unassisted, Nonantalgic.       Assessment:   49 year old female with bilateral foot pain, likely plantar fasciitis but also concern for possible neuropathy/nerve issues     Plan:  -Treatment options discussed including all alternatives, risks, and complications -Etiology of symptoms were discussed -X-rays were obtained and reviewed with the patient. There is no evidence of acute fracture identified. -Regards to the foot pain we discussed plantar fasciitis.  Start stretching, icing daily.   Discussed shoe modifications and orthotics.  We will check orthotic coverage for her. Offered steroid injection. Plantar fascial braces dispensed.  -Concerned about the numbness which has been ongoing for couple of years.  This could be from plantar fasciitis but also due to other issues.  I would order nerve conduction test as well as blood work.  Vivi Barrack DPM

## 2020-02-13 ENCOUNTER — Encounter: Payer: Self-pay | Admitting: Neurology

## 2020-02-28 ENCOUNTER — Ambulatory Visit (INDEPENDENT_AMBULATORY_CARE_PROVIDER_SITE_OTHER): Payer: 59 | Admitting: Neurology

## 2020-02-28 DIAGNOSIS — R351 Nocturia: Secondary | ICD-10-CM

## 2020-02-28 DIAGNOSIS — R0683 Snoring: Secondary | ICD-10-CM

## 2020-02-28 DIAGNOSIS — J351 Hypertrophy of tonsils: Secondary | ICD-10-CM

## 2020-02-28 DIAGNOSIS — G4733 Obstructive sleep apnea (adult) (pediatric): Secondary | ICD-10-CM

## 2020-02-28 DIAGNOSIS — E663 Overweight: Secondary | ICD-10-CM

## 2020-02-28 DIAGNOSIS — G4719 Other hypersomnia: Secondary | ICD-10-CM

## 2020-02-28 DIAGNOSIS — Z820 Family history of epilepsy and other diseases of the nervous system: Secondary | ICD-10-CM

## 2020-02-28 DIAGNOSIS — R0681 Apnea, not elsewhere classified: Secondary | ICD-10-CM

## 2020-02-28 DIAGNOSIS — R519 Headache, unspecified: Secondary | ICD-10-CM

## 2020-03-04 ENCOUNTER — Telehealth: Payer: Self-pay

## 2020-03-04 NOTE — Procedures (Signed)
   [facility]  HOME SLEEP TEST (Watch PAT)  STUDY DATE: 02/28/20  DOB: 1970/10/31  MRN: [id number]  ORDERING CLINICIAN: Huston Foley, MD, PhD   REFERRING CLINICIAN: Farley Ly, PA-C  CLINICAL INFORMATION/HISTORY: 49 year old woman with a history of sickle cell trait, interstitial cystitis, hypertension, heart murmur, endometriosis, asthma, vitamin D deficiency and overweight state, who reports snoring and excessive daytime somnolence.    Epworth sleepiness score: 15/24.  BMI: 29.8 kg/m  FINDINGS:   Total Record Time (hours, min): 6hr,  Total Sleep Time (hours, min):  4hr,   Percent REM (%):    5.7   Calculated pAHI (per hour):  54.4     REM pAHI:    NA    NREM pAHI: 54.4   Oxygen Saturation (%) Mean: 94 Minimum oxygen saturation (%):                 83   O2 Saturation Range (%): 83 - 99  O2Saturation (minutes) <=88%: 1.6  Pulse Mean (bpm):    63  Pulse Range (51 - 95)   IMPRESSION: OSA (obstructive sleep apnea), severe   RECOMMENDATION:  This home sleep test demonstrates severe obstructive sleep apnea with a total AHI of 54.4/hour and O2 nadir of 83%. Treatment with positive airway pressure is recommended. The patient will be advised to proceed with an autoPAP titration/trial at home for now. A full night titration study may be considered to optimize treatment settings, if needed down the road. Please note that untreated obstructive sleep apnea may carry additional perioperative morbidity. Patients with significant obstructive sleep apnea should receive perioperative PAP therapy and the surgeons and particularly the anesthesiologist should be informed of the diagnosis and the severity of the sleep disordered breathing. Other treatment options will be limited due to the severity of her sleep disordered breathing; weight loss is encouraged.  The patient should be cautioned not to drive, work at heights, or operate dangerous or heavy equipment  when tired or sleepy. Review and reiteration of good sleep hygiene measures should be pursued with any patient. Other causes of the patient's symptoms, including circadian rhythm disturbances, an underlying mood disorder, medication effect and/or an underlying medical problem cannot be ruled out based on this test. Clinical correlation is recommended. The patient and her referring provider will be notified of the test results. The patient will be seen in follow up in sleep clinic at [facility].  I certify that I have reviewed the raw data recording prior to the issuance of this report in accordance with the standards of the American Academy of Sleep Medicine (AASM).  INTERPRETING PHYSICIAN:    Huston Foley, MD, PhD  Board Certified in Neurology and Sleep Medicine  [facility] [address], [address] [place], [place] [address] [phone]

## 2020-03-04 NOTE — Telephone Encounter (Signed)
-----   Message from Huston Foley, MD sent at 03/04/2020  9:39 AM EST ----- Patient referred by Farley Ly, PA, seen by me on 01/25/20, HST on 02/28/20.    Please call and notify the patient that the recent home sleep test showed obstructive sleep apnea in the severe range. I recommend treatment for this in the form of autoPAP, which means, that we don't have to bring her in for a sleep study with CPAP, but will let her start using a so called autoPAP machine at home, through a DME company (of her choice, or as per insurance requirement). The DME representative will educate her on how to use the machine, how to put the mask on, etc. Please inform patient about the importance of compliance with PAP therapy. I have placed an order in the chart. Please send referral, talk to patient, send report to referring MD. We will need a FU in sleep clinic for 10 weeks post-PAP set up, please arrange that with me or one of our NPs. Thanks,   Huston Foley, MD, PhD [facility])

## 2020-03-04 NOTE — Progress Notes (Signed)
Patient referred by Farley Ly, PA, seen by me on 01/25/20, HST on 02/28/20.    Please call and notify the patient that the recent home sleep test showed obstructive sleep apnea in the severe range. I recommend treatment for this in the form of autoPAP, which means, that we don't have to bring her in for a sleep study with CPAP, but will let her start using a so called autoPAP machine at home, through a DME company (of her choice, or as per insurance requirement). The DME representative will educate her on how to use the machine, how to put the mask on, etc. Please inform patient about the importance of compliance with PAP therapy. I have placed an order in the chart. Please send referral, talk to patient, send report to referring MD. We will need a FU in sleep clinic for 10 weeks post-PAP set up, please arrange that with me or one of our NPs. Thanks,   Huston Foley, MD, PhD [facility])

## 2020-03-04 NOTE — Telephone Encounter (Signed)
I called pt. I advised pt that Dr. Frances Furbish reviewed their sleep study results and found that pt has severe osa. Dr. Frances Furbish recommends that pt start an auto pap at home. I reviewed PAP compliance expectations with the pt. Pt is agreeable to starting an auto-PAP. I advised pt that an order will be sent to a DME, [facility], and [facility] will call the pt within about one week after they file with the pt's insurance. [facility] will show the pt how to use the machine, fit for masks, and troubleshoot the auto-PAP if needed. A follow up appt was made for insurance purposes with Amy, NP on 06/05/20 at 8:30am. Pt verbalized understanding to arrive 15 minutes early and bring their auto-PAP. A letter with all of this information in it will be mailed to the pt as a reminder. I verified with the pt that the address we have on file is correct. Pt verbalized understanding of results. Pt had no questions at this time but was encouraged to call back if questions arise. I have sent the order to [facility] and have received confirmation that they have received the order.

## 2020-03-04 NOTE — Addendum Note (Signed)
Addended by: Huston Foley on: 03/04/2020 09:39 AM   Modules accepted: Orders

## 2020-03-27 ENCOUNTER — Encounter: Payer: 59 | Admitting: Neurology

## 2020-03-27 DIAGNOSIS — Z029 Encounter for administrative examinations, unspecified: Secondary | ICD-10-CM

## 2020-03-29 ENCOUNTER — Encounter: Payer: Self-pay | Admitting: Neurology

## 2020-05-07 ENCOUNTER — Telehealth: Payer: Self-pay | Admitting: Neurology

## 2020-05-07 NOTE — Telephone Encounter (Signed)
I have fwd insurance information over to [facility] for them to have and process CPAP request.

## 2020-05-07 NOTE — Telephone Encounter (Signed)
Pt calling to give her new insurance info so she is able to get her CPAP [employer] ID: [id number] Group# [id number] [phone]

## 2020-06-05 ENCOUNTER — Ambulatory Visit: Payer: Self-pay | Admitting: Family Medicine

## 2020-06-14 ENCOUNTER — Encounter (HOSPITAL_COMMUNITY): Payer: Self-pay

## 2020-06-14 ENCOUNTER — Emergency Department (HOSPITAL_COMMUNITY): Payer: BLUE CROSS/BLUE SHIELD

## 2020-06-14 ENCOUNTER — Emergency Department (HOSPITAL_COMMUNITY)
Admission: EM | Admit: 2020-06-14 | Discharge: 2020-06-14 | Disposition: A | Discharge status: Home / Self Care | Payer: BLUE CROSS/BLUE SHIELD | Attending: Emergency Medicine | Admitting: Emergency Medicine

## 2020-06-14 ENCOUNTER — Other Ambulatory Visit: Payer: Self-pay

## 2020-06-14 DIAGNOSIS — R0602 Shortness of breath: Secondary | ICD-10-CM | POA: Diagnosis not present

## 2020-06-14 DIAGNOSIS — I10 Essential (primary) hypertension: Secondary | ICD-10-CM | POA: Diagnosis not present

## 2020-06-14 DIAGNOSIS — Z7952 Long term (current) use of systemic steroids: Secondary | ICD-10-CM | POA: Insufficient documentation

## 2020-06-14 DIAGNOSIS — F17210 Nicotine dependence, cigarettes, uncomplicated: Secondary | ICD-10-CM | POA: Insufficient documentation

## 2020-06-14 DIAGNOSIS — J45909 Unspecified asthma, uncomplicated: Secondary | ICD-10-CM | POA: Diagnosis not present

## 2020-06-14 DIAGNOSIS — R079 Chest pain, unspecified: Secondary | ICD-10-CM | POA: Insufficient documentation

## 2020-06-14 LAB — CBC
HCT: 39.9 % (ref 36.0–46.0)
Hemoglobin: 13.6 g/dL (ref 12.0–15.0)
MCH: 28.8 pg (ref 26.0–34.0)
MCHC: 34.1 g/dL (ref 30.0–36.0)
MCV: 84.5 fL (ref 80.0–100.0)
Platelets: 303 10*3/uL (ref 150–400)
RBC: 4.72 MIL/uL (ref 3.87–5.11)
RDW: 14.3 % (ref 11.5–15.5)
WBC: 10.1 10*3/uL (ref 4.0–10.5)
nRBC: 0 % (ref 0.0–0.2)

## 2020-06-14 LAB — BASIC METABOLIC PANEL
Anion gap: 9 (ref 5–15)
BUN: 14 mg/dL (ref 6–20)
CO2: 26 mmol/L (ref 22–32)
Calcium: 9.4 mg/dL (ref 8.9–10.3)
Chloride: 106 mmol/L (ref 98–111)
Creatinine, Ser: 0.87 mg/dL (ref 0.44–1.00)
GFR, Estimated: >60 mL/min (ref >60)
Glucose, Bld: 104 mg/dL — ABNORMAL HIGH (ref 70–99)
Potassium: 3.8 mmol/L (ref 3.5–5.1)
Sodium: 141 mmol/L (ref 135–145)

## 2020-06-14 LAB — TROPONIN I (HIGH SENSITIVITY)
Troponin I (High Sensitivity): 3 ng/L (ref <18)
Troponin I (High Sensitivity): 3 ng/L (ref <18)

## 2020-06-14 NOTE — ED Notes (Signed)
Pt verbalizes understanding of DC paper work. Pt ambulated out of ED without difficulty.

## 2020-06-14 NOTE — Discharge Instructions (Addendum)
Call your primary care doctor or specialist as discussed in the next 2-3 days.   Return immediately back to the ER if:  Your symptoms worsen within the next 12-24 hours. You develop new symptoms such as new fevers, persistent vomiting, new pain, shortness of breath, or new weakness or numbness, or if you have any other concerns.  

## 2020-06-14 NOTE — ED Notes (Signed)
Pt upset regarding care. Pt states that she is not being treated for what she came here for.

## 2020-06-14 NOTE — ED Notes (Signed)
EDP at bedside  

## 2020-06-14 NOTE — ED Triage Notes (Signed)
patient c/o intermittent chest pain and SOB x 4 days. Patient states she has been taking zyrtec for allergies and sinus issues.

## 2020-06-14 NOTE — ED Provider Notes (Signed)
[facility]-EMERGENCY DEPT Provider Note   CSN: [id number] Arrival date & time: 06/14/20  1022     History Chief Complaint  Patient presents with  . Chest Pain    Dawn Atkinson is a 50 y.o. female.  Patient presents with chest pain or shortness of breath intermittently for the past 4 days describes as an ache in the mid chest last about 1 or 2 minutes and then resolves but has been intermittent for the past 4 days.  Denies any fevers.  No vomiting or diarrhea.  Currently denies pain or discomfort.        Past Medical History:  Diagnosis Date  . Asthma   . Endometriosis   . Headache(784.0)   . Heart murmur   . Hypertension   . Interstitial cystitis   . Sickle cell trait (HCC)   . Vitamin D insufficiency     Patient Active Problem List   Diagnosis Date Noted  . Pelvic pain in female 08/31/2017  . Urinary frequency 08/31/2017  . Asthma 10/18/2010  . H/O: hysterectomy 10/18/2010  . Cystitis, interstitial 10/18/2010  . Constipation 10/18/2010    Past Surgical History:  Procedure Laterality Date  . ABDOMINAL HYSTERECTOMY    . ANKLE SURGERY    . BARTHOLIN GLAND CYST EXCISION    . COLONOSCOPY    . CYSTECTOMY    . DILATION AND CURETTAGE OF UTERUS    . EYE SURGERY    . INGUINAL HERNIA REPAIR    . LAPAROSCOPY    . UPPER GI ENDOSCOPY       OB History    Gravida  3   Para  1   Term      Preterm  0   AB  2   Living  1     SAB  1   IAB  1   Ectopic      Multiple      Live Births  1           Family History  Problem Relation Age of Onset  . Hypertension Mother   . Fibromyalgia Mother   . Hypertension Father   . Vitamin D deficiency Father   . Alzheimer's disease Maternal Grandmother   . Prostate cancer Maternal Grandfather   . Cirrhosis Paternal Grandmother   . Hypertension Paternal Grandmother   . Pneumonia Paternal Grandfather   . Hypertension Paternal Grandfather   . GER disease Brother   . Hypertension  Sister   . Anxiety disorder Sister   . Asthma Child   . Pulmonary Hypertension Child   . Gout Child     Social History   Tobacco Use  . Smoking status: Current Some Day Smoker    Types: Cigarettes  . Smokeless tobacco: Never Used  . Tobacco comment: 1-2 cig  Vaping Use  . Vaping Use: Never used  Substance Use Topics  . Alcohol use: Yes    Comment: socially  . Drug use: No    Home Medications Prior to Admission medications   Medication Sig Start Date End Date Taking? Authorizing Provider  albuterol (PROVENTIL HFA;VENTOLIN HFA) 108 (90 Base) MCG/ACT inhaler Inhale 1-2 puffs into the lungs every 6 (six) hours as needed for wheezing or shortness of breath. 03/25/18  Yes Jeannie Fend, PA-C  azelastine (ASTELIN) 0.1 % nasal spray Place 1 spray into both nostrils daily. Use in each nostril as directed   Yes [provider]  cetirizine (ZYRTEC) 10 MG tablet Take 10  mg by mouth daily. For allergies   Yes [provider]  cholecalciferol (VITAMIN D3) 25 MCG (1000 UNIT) tablet Take 1,000 Units by mouth daily.   Yes [provider]  co-enzyme Q-10 30 MG capsule Take 30 mg by mouth daily.   Yes [provider]  Cod Liver Oil CAPS Take 1 capsule by mouth daily.   Yes [provider]  docusate sodium (COLACE) 100 MG capsule Take 100 mg by mouth daily as needed for mild constipation.   Yes [provider]  losartan (COZAAR) 100 MG tablet Take 100 mg by mouth daily.    Yes [provider]  albuterol (PROVENTIL) (2.5 MG/3ML) 0.083% nebulizer solution Take 3 mLs (2.5 mg total) by nebulization every 6 (six) hours as needed for wheezing or shortness of breath. Patient not taking: Reported on 06/14/2020 03/07/15   Dowless, Lelon Mast Tripp, PA-C  HYDROcodone-acetaminophen (NORCO) 5-325 MG tablet Take 1 tablet by mouth every 6 (six) hours as needed. Patient not taking: Reported on 06/14/2020 05/23/19   Dartha Lodge, PA-C  ibuprofen (ADVIL)  600 MG tablet Take 1 tablet (600 mg total) by mouth every 6 (six) hours as needed. Patient not taking: Reported on 06/14/2020 05/23/19   Dartha Lodge, PA-C  ibuprofen (ADVIL) 800 MG tablet Take 1 tablet (800 mg total) by mouth every 8 (eight) hours as needed. Patient not taking: Reported on 06/14/2020 02/06/20   Vivi Barrack, DPM  naproxen (NAPROSYN) 500 MG tablet Take 1 tablet (500 mg total) by mouth 2 (two) times daily. Patient not taking: Reported on 06/14/2020 09/23/19   Hall-Potvin, [place], PA-C  Vitamin D, Ergocalciferol, (DRISDOL) 1.25 MG (50000 UNIT) CAPS capsule Take 1 capsule (50,000 Units total) by mouth every 7 (seven) days. Patient not taking: Reported on 06/14/2020 02/09/20   Vivi Barrack, DPM    Allergies    Amoxicillin-pot clavulanate, Avelox [moxifloxacin hydrochloride], Hydrochlorothiazide, Erythromycin, Penicillins, Aspirin, Gabapentin, Lisinopril, Oxycodone, Sulfa antibiotics, and Tessalon [benzonatate]  Review of Systems   Review of Systems  Constitutional: Negative for fever.  HENT: Negative for ear pain.   Eyes: Negative for pain.  Respiratory: Negative for cough.   Cardiovascular: Positive for chest pain.  Gastrointestinal: Negative for abdominal pain.  Genitourinary: Negative for flank pain.  Musculoskeletal: Negative for back pain.  Skin: Negative for rash.  Neurological: Negative for headaches.    Physical Exam Updated Vital Signs BP (!) 166/95   Pulse 62   Temp 98.5 F (36.9 C) (Oral)   Resp 18   Ht 5\' 6"  (1.676 m)   Wt 85.7 kg   SpO2 98%   BMI 30.51 kg/m   Physical Exam Constitutional:      General: She is not in acute distress.    Appearance: Normal appearance.  HENT:     Head: Normocephalic.     Nose: Nose normal.  Eyes:     Extraocular Movements: Extraocular movements intact.  Cardiovascular:     Rate and Rhythm: Normal rate.  Pulmonary:     Effort: Pulmonary effort is normal.  Musculoskeletal:        General: Normal range  of motion.     Cervical back: Normal range of motion.  Neurological:     General: No focal deficit present.     Mental Status: She is alert. Mental status is at baseline.     ED Results / Procedures / Treatments   Labs (all labs ordered are listed, but only abnormal results are displayed) Labs Reviewed  BASIC METABOLIC PANEL - Abnormal; Notable for the following components:      Result Value   Glucose, Bld 104 (*)    All other components within normal limits  CBC  TROPONIN I (HIGH SENSITIVITY)  TROPONIN I (HIGH SENSITIVITY)    EKG EKG Interpretation  Date/Time:  Friday June 14 2020 10:32:07 EST Ventricular Rate:  96 PR Interval:    QRS Duration: 81 QT Interval:  359 QTC Calculation: 454 R Axis:   78 Text Interpretation: Sinus rhythm Right atrial enlargement Abnormal R-wave progression, early transition Borderline T wave abnormalities 12 Lead; Mason-Likar Confirmed by Norman Clay ([id number]) on 06/14/2020 11:35:13 AM   Radiology DG Chest 2 View  Result Date: 06/14/2020 CLINICAL DATA:  Intermittent chest pain and shortness of breath for 4 days. EXAM: CHEST - 2 VIEW COMPARISON:  Radiographs 03/25/2018.  CT 01/14/2006. FINDINGS: The heart size and mediastinal contours are normal. The lungs are clear. There is no pleural effusion or pneumothorax. No acute osseous findings are identified. Telemetry leads overlie the chest. IMPRESSION: No active cardiopulmonary process. Electronically Signed   By: Carey Bullocks M.D.   On: 06/14/2020 12:12    Procedures Procedures   Medications Ordered in ED Medications - No data to display  ED Course  I have reviewed the triage vital signs and the nursing notes.  Pertinent labs & imaging results that were available during my care of the patient were reviewed by me and considered in my medical decision making (see chart for details).    MDM Rules/Calculators/A&P                          Labs unremarkable.  Troponin x2  unremarkable.  Recommending outpatient follow-up with cardiology within the week.  Recommending rest no strenuous activity until cleared by cardiology.  Advised immediate return for recurrent pain fevers trouble breathing or any additional concerns.   Final Clinical Impression(s) / ED Diagnoses Final diagnoses:  Nonspecific chest pain    Rx / DC Orders ED Discharge Orders    None       Cheryll Cockayne, MD 06/14/20 [phone]

## 2020-08-06 ENCOUNTER — Encounter: Payer: Self-pay | Admitting: Neurology

## 2020-08-07 ENCOUNTER — Other Ambulatory Visit: Payer: Self-pay

## 2020-08-07 DIAGNOSIS — G4733 Obstructive sleep apnea (adult) (pediatric): Secondary | ICD-10-CM

## 2020-08-07 DIAGNOSIS — G4719 Other hypersomnia: Secondary | ICD-10-CM

## 2020-09-26 ENCOUNTER — Encounter: Payer: Self-pay | Admitting: Neurology

## 2020-09-26 ENCOUNTER — Ambulatory Visit: Payer: BLUE CROSS/BLUE SHIELD | Admitting: Neurology

## 2020-09-26 VITALS — BP 139/93 | HR 76 | Ht 66.0 in | Wt 187.0 lb

## 2020-09-26 DIAGNOSIS — Z9989 Dependence on other enabling machines and devices: Secondary | ICD-10-CM | POA: Diagnosis not present

## 2020-09-26 DIAGNOSIS — G4733 Obstructive sleep apnea (adult) (pediatric): Secondary | ICD-10-CM

## 2020-09-26 NOTE — Progress Notes (Signed)
Subjective:    Patient ID: Dawn Atkinson is a 50 y.o. female.  HPI    Interim history:   Dawn Atkinson is a 50 year old right-handed woman with an underlying medical history of sickle cell trait, interstitial cystitis, hypertension, heart murmur, endometriosis, asthma, vitamin D deficiency and overweight state, who presents for follow-up consultation of her obstructive sleep apnea after interim testing and starting AutoPap therapy.  The patient is unaccompanied today.  I first met her at the request of her primary care PA on 01/25/2020, at which time she reported snoring and excessive daytime somnolence.  She was advised to proceed with a sleep study.  She had a home sleep test on 02/28/2020 which indicated severe obstructive sleep apnea with an AHI of 54.4/h, O2 nadir 83%.  She was advised to proceed with home AutoPap therapy.  Her set up date was 08/06/2020.  She emailed in the interim reporting that the pressure felt too high.  I reduced her minimum and maximum AutoPap pressure.  Today, 09/26/2020: I reviewed her AutoPap compliance data from 08/26/2020 through 09/24/2020, which is a total of 30 days, during which time she used her machine 26 days with percent use days greater than 4 hours at 60%, indicating suboptimal compliance with an average usage for days on treatment of 5 hours and 24 minutes, residual AHI slightly borderline at 5.3/h, 95th percentile of pressure at 9.5 cm, range of 4 to 10 cm with EPR of 3, leak on the higher side intermittently with a 95th percentile at 21.5 L/min.  She reports that she could tolerate the lower pressure little bit better, still has intermittent bloating.  Overall feels that the AutoPap has been helpful.  She has had better quality sleep, she still has a erratic sleep schedule because she works as needed at night.  She is working towards a more steady work schedule.  She is considering treatment of her tonsillar hypertrophy and inferior turbinate hypertrophy with surgery.   She had seen ENT before and wants to make a follow-up appointment.  She is motivated to continue with treatment.  Sometimes she monitors her usage hours on her cell phone app and it seems to be discrepant from what the machine shows as usage.  She did not bring the machine today, she is advised to talk to her DME company about getting a card download rather than a remote download.  She has been using 2 different kinds of masks.  The nasal cushion exacerbated her eczema.  She started off with a full facemask which caused some lip swelling.  She alternates between the 2 different masks.  She does report residual snoring.  She would be willing to try a slightly higher pressure.  The patient's allergies, current medications, family history, past medical history, past social history, past surgical history and problem list were reviewed and updated as appropriate.   Previously:   01/25/20: (She) reports snoring and excessive daytime somnolence.  I reviewed your office note from 11/24/2019.  Her Epworth sleepiness score is 15 out of 24 today, fatigue severity score is 50 out of 63. Her boyfriend has noted apneic pauses while she is asleep. She has had palpitations at times. She has woken up with a headache at times. She does have nocturia but also has a history of interstitial cystitis. Bedtime is generally around 6 or 7 PM as she has to get up early for work. Rise time is around 3 AM. She has to be at work typically at [employer]  AM. 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 Her mother has sleep apnea and has a CPAP machine. Patient limits her caffeine to 1 serving per day on average and drinks alcohol occasionally, she is an occasional/social smoker. She has a history of bruxism and has talked to her dentist about it. She also reports that her dentist treats sleep apnea. She has seen an allergy specialist. She often wakes up with swelling in her face. She has  noticed swelling in her tonsils and uvula at times. She had seen ENT before for her tonsillar hypertrophy but tonsillectomy was not recommended per se but considered as a possibility if needed.   Her Past Medical History Is Significant For: Past Medical History:  Diagnosis Date   Asthma    Endometriosis    Headache(784.0)    Heart murmur    Hypertension    Interstitial cystitis    Sickle cell trait (HCC)    Vitamin D insufficiency     Her Past Surgical History Is Significant For: Past Surgical History:  Procedure Laterality Date   ABDOMINAL HYSTERECTOMY     ANKLE SURGERY     BARTHOLIN GLAND CYST EXCISION     COLONOSCOPY     CYSTECTOMY     DILATION AND CURETTAGE OF UTERUS     EYE SURGERY     INGUINAL HERNIA REPAIR     LAPAROSCOPY     UPPER GI ENDOSCOPY      Her Family History Is Significant For: Family History  Problem Relation Age of Onset   Hypertension Mother    Fibromyalgia Mother    Hypertension Father    Vitamin D deficiency Father    Alzheimer's disease Maternal Grandmother    Prostate cancer Maternal Grandfather    Cirrhosis Paternal Grandmother    Hypertension Paternal Grandmother    Pneumonia Paternal Grandfather    Hypertension Paternal Grandfather    GER disease Brother    Hypertension Sister    Anxiety disorder Sister    Asthma Child    Pulmonary Hypertension Child    Gout Child     Her Social History Is Significant For: Social History   Socioeconomic History   Marital status: Divorced    Spouse name: Not on file   Number of children: Not on file   Years of education: Not on file   Highest education level: Not on file  Occupational History   Not on file  Tobacco Use   Smoking status: Some Days    Pack years: 0.00    Types: Cigarettes   Smokeless tobacco: Never   Tobacco comments:    1-2 cig  Vaping Use   Vaping Use: Never used  Substance and Sexual Activity   Alcohol use: Yes    Comment: socially   Drug use: No   Sexual activity:  Not Currently    Birth control/protection: Surgical  Other Topics Concern   Not on file  Social History Narrative   Not on file   Social Determinants of Health   Financial Resource Strain: Not on file  Food Insecurity: Not on file  Transportation Needs: Not on file  Physical Activity: Not on file  Stress: Not on file  Social Connections: Not on file    Her Allergies Are:  Allergies  Allergen Reactions   Amoxicillin-Pot Clavulanate Anaphylaxis    Has patient had a PCN reaction causing immediate rash, facial/tongue/throat swelling, SOB or lightheadedness with hypotension:  Unknown.  Pt thinks, but cannot confirm rash.  Has patient had a PCN reaction causing severe rash involving mucus membranes or skin necrosis: Unknown Has patient had a PCN reaction that required hospitalization No Has patient had a PCN reaction occurring within the last 10 years: No If all of the above answers are "NO", then may proceed with Cephalosporin use.    Avelox [Moxifloxacin Hydrochloride] Anaphylaxis   Hydrochlorothiazide Anaphylaxis and Other (See Comments)    hypotension   Erythromycin Other (See Comments)    Unknown   Penicillins    Aspirin Diarrhea    childhood   Gabapentin Other (See Comments)    hallucinations    Lisinopril Other (See Comments)    Induced patient asthma   Oxycodone Hives    "tries to dig skin out"   Sulfa Antibiotics Hives   Tessalon [Benzonatate] Other (See Comments)    WHOOPING COUGH   :   Her Current Medications Are:  Outpatient Encounter Medications as of 09/26/2020  Medication Sig   albuterol (PROVENTIL HFA;VENTOLIN HFA) 108 (90 Base) MCG/ACT inhaler Inhale 1-2 puffs into the lungs every 6 (six) hours as needed for wheezing or shortness of breath.   albuterol (PROVENTIL) (2.5 MG/3ML) 0.083% nebulizer solution Take 3 mLs (2.5 mg total) by nebulization every 6 (six) hours as needed for wheezing or shortness of breath.   azelastine (ASTELIN) 0.1 % nasal spray Place 1  spray into both nostrils daily. Use in each nostril as directed   cetirizine (ZYRTEC) 10 MG tablet Take 10 mg by mouth daily. For allergies   cholecalciferol (VITAMIN D3) 25 MCG (1000 UNIT) tablet Take 1,000 Units by mouth daily.   co-enzyme Q-10 30 MG capsule Take 30 mg by mouth daily.   Cod Liver Oil CAPS Take 1 capsule by mouth daily.   docusate sodium (COLACE) 100 MG capsule Take 100 mg by mouth daily as needed for mild constipation.   ezetimibe (ZETIA) 10 MG tablet Take 10 mg by mouth daily.   HYDROcodone-acetaminophen (NORCO) 5-325 MG tablet Take 1 tablet by mouth every 6 (six) hours as needed.   ibuprofen (ADVIL) 600 MG tablet Take 1 tablet (600 mg total) by mouth every 6 (six) hours as needed.   ibuprofen (ADVIL) 800 MG tablet Take 1 tablet (800 mg total) by mouth every 8 (eight) hours as needed.   losartan (COZAAR) 100 MG tablet Take 100 mg by mouth daily.    naproxen (NAPROSYN) 500 MG tablet Take 1 tablet (500 mg total) by mouth 2 (two) times daily.   Vitamin D, Ergocalciferol, (DRISDOL) 1.25 MG (50000 UNIT) CAPS capsule Take 1 capsule (50,000 Units total) by mouth every 7 (seven) days.   No facility-administered encounter medications on file as of 09/26/2020.  :  Review of Systems:  Out of a complete 14 point review of systems, all are reviewed and negative with the exception of these symptoms as listed below:  Review of Systems  Neurological:        Here for f/u on autopap. Reports trouble getting used to machine but working on using it more consistently.    Objective:  Neurological Exam  Physical Exam Physical Examination:   Vitals:   09/26/20 1046  BP: (!) 139/93  Pulse: 76    General Examination: The patient is a very pleasant 50 y.o. female in no acute distress. She appears well-developed and well-nourished and well groomed.   HEENT: Normocephalic, atraumatic, pupils are equal, round and reactive to light, extraocular tracking is good  without limitation to gaze  excursion or nystagmus noted. Hearing is grossly intact. Face is symmetric with normal facial animation. Speech is clear with no dysarthria noted. There is no hypophonia. There is no lip, neck/head, jaw or voice tremor. Neck is supple with full range of passive and active motion. There are no carotid bruits on auscultation. Oropharynx exam reveals: mild mouth dryness, good dental hygiene and moderate airway crowding, due to small airway entry, tonsillar size of about 3+ bilaterally.  Nasal inspection reveals mucosal swelling and inferior turbinate hypertrophy.   Chest: Clear to auscultation without wheezing, rhonchi or crackles noted.   Heart: S1+S2+0, regular and normal without murmurs, rubs or gallops noted.   Abdomen: Soft, non-tender and non-distended.   Extremities: There is nonpitting puffiness noted around both ankles.   Skin: Warm and dry without new changes.     Musculoskeletal: exam reveals no obvious joint deformities, tenderness or joint swelling or erythema.   Neurologically: Mental status: The patient is awake, alert and oriented in all 4 spheres. Her immediate and remote memory, attention, language skills and fund of knowledge are appropriate. There is no evidence of aphasia, agnosia, apraxia or anomia. Speech is clear with normal prosody and enunciation. Thought process is linear. Mood is normal and affect is normal. Cranial nerves II - XII are as described above under HEENT exam. Motor exam: Normal bulk, strength and tone is noted. There is no tremor, fine motor skills and coordination: grossly intact. Cerebellar testing: No dysmetria or intention tremor. There is no truncal or gait ataxia. Sensory exam: intact to light touch in the upper and lower extremities. Gait, station and balance: She stands easily. No veering to one side is noted. No leaning to one side is noted. Posture is age-appropriate and stance is narrow based. Gait shows normal stride length and normal pace. No  problems turning are noted.    Assessment and Plan:  In summary, Taneisha Fuson Lodico is a very pleasant 50 year old female with an underlying medical history of sickle cell trait, interstitial cystitis, hypertension, heart murmur, endometriosis, asthma, vitamin D deficiency and overweight state, who presents for follow-up consultation of her obstructive sleep apnea, after interim testing and starting AutoPap therapy.  Her home sleep test from 02/28/2020 which indicated severe obstructive sleep apnea with an AHI of 54.4/h, O2 nadir 83%.  She was advised to proceed with home AutoPap therapy.  Her set up date was 08/06/2020.  She had trouble initially with tolerance of the AutoPap pressure.  We reduce the pressure settings and she has been able to tolerate the treatment better.  She is not quite there yet with her compliance, she is working on better treatment adherence.  Her sleep schedule is somewhat erratic because of her night shifts.  She is encouraged to be fully compliant with her AutoPap.  She is willing to try a slightly higher pressure as her residual AHI is around 5.3 currently and she has also noted residual snoring.  To that end, I will increase her maximum pressure on her AutoPap from 10 cm to 11 cm.  She can continue to alternate between her nasal cushion interface as well as full facemask.  She is able to tolerate both at this time.  She is advised to follow-up routinely to see one of our nurse practitioners in 6 months, she is encouraged to call or email [place] in the interim with any questions or concerns.  Furthermore, she is advised to get in touch with her DME company  to get a hopefully more accurate download from the machine directly via SD card.  She is advised that she can also bring [place] the machine with the power cord and we can do a download in the office.  She is noticing some improvement in her sleep-related symptoms and is encouraged to continue with treatment.  I answered all her questions today  and she was in agreement with the plan.  I spent 30 minutes in total face-to-face time and in reviewing records during pre-charting, more than 50% of which was spent in counseling and coordination of care, reviewing test results, reviewing medications and treatment regimen and/or in discussing or reviewing the diagnosis of OSA, the prognosis and treatment options. Pertinent laboratory and imaging test results that were available during this visit with the patient were reviewed by me and considered in my medical decision making (see chart for details).

## 2020-09-26 NOTE — Patient Instructions (Signed)
It was nice to see you again. You have adjusted fairly well to treatment.  As discussed, we will increase your maximum pressure from 10 cm to 11 cm at this time due to residual snoring and your apnea score slightly suboptimal around 5/h, we would like to have your residual sleep apnea score less than 5/h.  Please continue using your autoPAP regularly. While your insurance requires that you use PAP at least 4 hours each night on 70% of the nights, I recommend, that you not skip any nights and use it throughout the night if you can. Getting used to PAP and staying with the treatment long term does take time and patience and discipline. Untreated obstructive sleep apnea when it is moderate to severe can have an adverse impact on cardiovascular health and raise her risk for heart disease, arrhythmias, hypertension, congestive heart failure, stroke and diabetes. Untreated obstructive sleep apnea causes sleep disruption, nonrestorative sleep, and sleep deprivation. This can have an impact on your day to day functioning and cause daytime sleepiness and impairment of cognitive function, memory loss, mood disturbance, and problems focussing. Using PAP regularly can improve these symptoms.  Please also talk to [facility] about getting a card download rather than remote access to your machines data as you have some discrepancy between your reported usage and the downloaded report from the machine.

## 2020-09-28 ENCOUNTER — Encounter: Payer: Self-pay | Admitting: Neurology

## 2020-10-13 ENCOUNTER — Other Ambulatory Visit: Payer: Self-pay

## 2020-10-13 ENCOUNTER — Emergency Department (HOSPITAL_COMMUNITY)
Admission: EM | Admit: 2020-10-13 | Discharge: 2020-10-13 | Disposition: A | Discharge status: Home / Self Care | Payer: BLUE CROSS/BLUE SHIELD | Attending: Emergency Medicine | Admitting: Emergency Medicine

## 2020-10-13 ENCOUNTER — Encounter (HOSPITAL_COMMUNITY): Payer: Self-pay

## 2020-10-13 ENCOUNTER — Emergency Department (HOSPITAL_COMMUNITY): Payer: BLUE CROSS/BLUE SHIELD

## 2020-10-13 DIAGNOSIS — J45909 Unspecified asthma, uncomplicated: Secondary | ICD-10-CM | POA: Insufficient documentation

## 2020-10-13 DIAGNOSIS — F17210 Nicotine dependence, cigarettes, uncomplicated: Secondary | ICD-10-CM | POA: Diagnosis not present

## 2020-10-13 DIAGNOSIS — Z20822 Contact with and (suspected) exposure to covid-19: Secondary | ICD-10-CM | POA: Insufficient documentation

## 2020-10-13 DIAGNOSIS — R079 Chest pain, unspecified: Secondary | ICD-10-CM | POA: Diagnosis not present

## 2020-10-13 DIAGNOSIS — B349 Viral infection, unspecified: Secondary | ICD-10-CM | POA: Diagnosis not present

## 2020-10-13 DIAGNOSIS — I10 Essential (primary) hypertension: Secondary | ICD-10-CM | POA: Diagnosis not present

## 2020-10-13 DIAGNOSIS — R002 Palpitations: Secondary | ICD-10-CM | POA: Diagnosis not present

## 2020-10-13 DIAGNOSIS — M791 Myalgia, unspecified site: Secondary | ICD-10-CM | POA: Diagnosis present

## 2020-10-13 LAB — BASIC METABOLIC PANEL
Anion gap: 9 (ref 5–15)
BUN: 12 mg/dL (ref 6–20)
CO2: 25 mmol/L (ref 22–32)
Calcium: 9.6 mg/dL (ref 8.9–10.3)
Chloride: 106 mmol/L (ref 98–111)
Creatinine, Ser: 0.63 mg/dL (ref 0.44–1.00)
GFR, Estimated: >60 mL/min (ref >60)
Glucose, Bld: 93 mg/dL (ref 70–99)
Potassium: 4.4 mmol/L (ref 3.5–5.1)
Sodium: 140 mmol/L (ref 135–145)

## 2020-10-13 LAB — CBC
HCT: 44.9 % (ref 36.0–46.0)
Hemoglobin: 14.5 g/dL (ref 12.0–15.0)
MCH: 28.3 pg (ref 26.0–34.0)
MCHC: 32.3 g/dL (ref 30.0–36.0)
MCV: 87.7 fL (ref 80.0–100.0)
Platelets: 260 10*3/uL (ref 150–400)
RBC: 5.12 MIL/uL — ABNORMAL HIGH (ref 3.87–5.11)
RDW: 14.4 % (ref 11.5–15.5)
WBC: 10.1 10*3/uL (ref 4.0–10.5)
nRBC: 0 % (ref 0.0–0.2)

## 2020-10-13 LAB — TROPONIN I (HIGH SENSITIVITY): Troponin I (High Sensitivity): 3 ng/L (ref <18)

## 2020-10-13 LAB — CK: Total CK: 150 U/L (ref 38–234)

## 2020-10-13 LAB — SARS CORONAVIRUS 2 (TAT 6-24 HRS): SARS Coronavirus 2: NEGATIVE

## 2020-10-13 MED ORDER — DEXAMETHASONE SODIUM PHOSPHATE 10 MG/ML IJ SOLN
10.0000 mg | Freq: Once | INTRAMUSCULAR | Status: AC
  Administered 2020-10-13: 10 mg via INTRAMUSCULAR
  Filled 2020-10-13: qty 1

## 2020-10-13 NOTE — Discharge Instructions (Addendum)
You were evaluated in the Emergency Department and after careful evaluation, we did not find any emergent condition requiring admission or further testing in the hospital.  Your exam/testing today was overall reassuring.  Symptoms seem to be due to a viral illness.  Recommend Tylenol or Motrin at home for discomfort, drinking plenty of fluids and resting.  Please return to the Emergency Department if you experience any worsening of your condition.  Thank you for allowing [place] to be a part of your care.

## 2020-10-13 NOTE — ED Notes (Signed)
Respirations even/unlabored, pt breathing without difficulty at this time. Able to speak in full sentences. SpO2 100% on room air.

## 2020-10-13 NOTE — ED Notes (Signed)
Pt verbalizes understanding of discharge instructions. Opportunity for questions and answers were provided. Pt discharged from the ED.   ?

## 2020-10-13 NOTE — ED Triage Notes (Signed)
Patient complains of body aches, chills, sore throat and congestion z 2 days, vaccinated and NAD

## 2020-10-13 NOTE — ED Provider Notes (Signed)
[facility]EM[facility]ENCY DEPT [facility] Emergency Department Provider Note MRN:  [id number]  Arrival date & time: 10/13/20     Chief Complaint   Body aches History of Present Illness   Dawn Atkinson is a 50 y.o. year-old female with a history of hypertension presenting to the ED with chief complaint of body aches.  Patient woke up feeling unwell.  General malaise, body aches, sore throat, scratchy throat.  Denies headache or vision change, no cough, no fever.  Endorsing chest pressure and palpitations over the past several days.  Denies shortness of breath, no abdominal pain.  Symptoms are constant, mild to moderate, no exacerbating relieving factors.  Review of Systems  A complete 10 system review of systems was obtained and all systems are negative except as noted in the HPI and PMH.   Patient's Health History    Past Medical History:  Diagnosis Date   Asthma    Endometriosis    Headache(784.0)    Heart murmur    Hypertension    Interstitial cystitis    Sickle cell trait (HCC)    Vitamin D insufficiency     Past Surgical History:  Procedure Laterality Date   ABDOMINAL HYSTERECTOMY     ANKLE SURGERY     BARTHOLIN GLAND CYST EXCISION     COLONOSCOPY     CYSTECTOMY     DILATION AND CURETTAGE OF UTERUS     EYE SURGERY     INGUINAL HERNIA REPAIR     LAPAROSCOPY     UPPER GI ENDOSCOPY      Family History  Problem Relation Age of Onset   Hypertension Mother    Fibromyalgia Mother    Hypertension Father    Vitamin D deficiency Father    Alzheimer's disease Maternal Grandmother    Prostate cancer Maternal Grandfather    Cirrhosis Paternal Grandmother    Hypertension Paternal Grandmother    Pneumonia Paternal Grandfather    Hypertension Paternal Grandfather    GER disease Brother    Hypertension Sister    Anxiety disorder Sister    Asthma Child    Pulmonary Hypertension Child    Gout Child     Social History   Socioeconomic History   Marital status: Divorced     Spouse name: Not on file   Number of children: Not on file   Years of education: Not on file   Highest education level: Not on file  Occupational History   Not on file  Tobacco Use   Smoking status: Some Days    Pack years: 0.00    Types: Cigarettes   Smokeless tobacco: Never   Tobacco comments:    1-2 cig  Vaping Use   Vaping Use: Never used  Substance and Sexual Activity   Alcohol use: Yes    Comment: socially   Drug use: No   Sexual activity: Not Currently    Birth control/protection: Surgical  Other Topics Concern   Not on file  Social History Narrative   Not on file   Social Determinants of Health   Financial Resource Strain: Not on file  Food Insecurity: Not on file  Transportation Needs: Not on file  Physical Activity: Not on file  Stress: Not on file  Social Connections: Not on file  Intimate Partner Violence: Not on file     Physical Exam   Vitals:   10/13/20 1415 10/13/20 1430  BP: (!) 156/95 (!) 154/96  Pulse: (!) 58 (!) 55  Resp: 15 16  Temp:    SpO2: 100% 100%    CONSTITUTIONAL: Well-appearing, NAD NEURO:  Alert and oriented x 3, no focal deficits EYES:  eyes equal and reactive ENT/NECK:  no LAD, no JVD CARDIO: Regular rate, well-perfused, normal S1 and S2 PULM:  CTAB no wheezing or rhonchi GI/GU:  normal bowel sounds, non-distended, non-tender MSK/SPINE:  No gross deformities, no edema SKIN:  no rash, atraumatic PSYCH:  Appropriate speech and behavior  *Additional and/or pertinent findings included in MDM below  Diagnostic and Interventional Summary    EKG Interpretation  Date/Time:  Sunday October 13 2020 12:09:29 EDT Ventricular Rate:  61 PR Interval:  161 QRS Duration: 85 QT Interval:  412 QTC Calculation: 415 R Axis:   44 Text Interpretation: Sinus rhythm Probable left atrial enlargement RSR' in V1 or V2, right VCD or RVH Confirmed by Kennis Carina [phone]) on 10/13/2020 2:41:31 PM        Labs Reviewed  CBC - Abnormal; Notable  for the following components:      Result Value   RBC 5.12 (*)    All other components within normal limits  SARS CORONAVIRUS 2 (TAT 6-24 HRS)  BASIC METABOLIC PANEL  CK  TROPONIN I (HIGH SENSITIVITY)    DG Chest 2 View  Final Result      Medications  dexamethasone (DECADRON) injection 10 mg (has no administration in time range)     Procedures  /  Critical Care Procedures  ED Course and Medical Decision Making  I have reviewed the triage vital signs, the nursing notes, and pertinent available records from the EMR.  Listed above are laboratory and imaging tests that I personally ordered, reviewed, and interpreted and then considered in my medical decision making (see below for details).  Suspect viral illness, patient recently started cholesterol medication and has body aches also considering myositis.  ACS also considered given chest pain though this sounds like more of a chronic issue.  Obtaining screening labs, CK, troponin, EKG, chest x-ray and will reassess.     Work-up is reassuring with negative troponin, patient is appropriate for discharge.  Elmer Sow. Pilar Plate, MD [facility] Emergency [facility] [email or web address]  Final Clinical Impressions(s) / ED Diagnoses     ICD-10-CM   1. Viral illness  B34.9     2. Chest pain  R07.9 DG Chest 2 View    DG Chest 2 View      ED Discharge Orders     None        Discharge Instructions Discussed with and Provided to Patient:     Discharge Instructions      You were evaluated in the Emergency Department and after careful evaluation, we did not find any emergent condition requiring admission or further testing in the hospital.  Your exam/testing today was overall reassuring.  Symptoms seem to be due to a viral illness.  Recommend Tylenol or Motrin at home for discomfort, drinking plenty of fluids and resting.  Please return to the Emergency Department if you experience any worsening of your  condition.  Thank you for allowing [place] to be a part of your care.         Sabas Sous, MD 10/13/20 [phone]

## 2020-10-25 ENCOUNTER — Ambulatory Visit
Admission: EM | Admit: 2020-10-25 | Discharge: 2020-10-25 | Disposition: A | Discharge status: Home / Self Care | Payer: BLUE CROSS/BLUE SHIELD

## 2020-10-25 ENCOUNTER — Other Ambulatory Visit: Payer: Self-pay

## 2020-10-25 DIAGNOSIS — M79671 Pain in right foot: Secondary | ICD-10-CM

## 2020-10-25 DIAGNOSIS — M722 Plantar fascial fibromatosis: Secondary | ICD-10-CM

## 2020-10-25 DIAGNOSIS — M79672 Pain in left foot: Secondary | ICD-10-CM

## 2020-10-25 MED ORDER — PREDNISONE 20 MG PO TABS: 40.0000 mg | ORAL_TABLET | Freq: Every day | ORAL | 0 refills | Status: AC

## 2020-10-25 MED ORDER — KETOROLAC TROMETHAMINE 60 MG/2ML IM SOLN
60.0000 mg | Freq: Once | INTRAMUSCULAR | Status: AC
  Administered 2020-10-25: 60 mg via INTRAMUSCULAR

## 2020-10-25 NOTE — Discharge Instructions (Addendum)
You have been given ketorolac injection for pain in urgent care today.  You are also been treating with a course of oral steroids.  Please not take any ibuprofen, Advil, Aleve while on steroid.  May take Tylenol as needed.  Please see attached exercises and treatment for plantar fasciitis.  Please follow-up with your podiatrist as soon possible to schedule a follow-up appointment.

## 2020-10-25 NOTE — ED Provider Notes (Signed)
[facility]    CSN: [id number] Arrival date & time: 10/25/20  1240      History   Chief Complaint Chief Complaint  Patient presents with   Foot Pain    Bilateral     HPI Dawn Atkinson is a 50 y.o. female.   Patient presents with 1 week history of bilateral foot pain that is described as burning sensation with numbness as well.  Denies tingling.  Pain is present on the plantar surface of her feet primarily in heels.  Pain is present at all times but is exacerbated by standing, walking, and bearing weight.  Has been taking ibuprofen without any relief of pain.  Patient denies any history of diabetes.  Patient has been seen and evaluated by [facility] and was diagnosed with neuropathy and plantar fasciitis.  Patient states that she had been using good arch support shoes and doing the at home interventions that were suggested by podiatry with resolution of her pain.  Then, she wore different shoes and pain returned.  Also has a history of scoliosis where she has appointment soon for reevaluation.  She started wearing new shoes because she was having some back pain and thought that they would help with her back pain.  Patient states the numbness radiates up her legs.  Per podiatry note, he was concerned about neuropathy and was going to do nerve conduction studies but patient states that she did not have insurance and was unable to get the studies done at that time.   Foot Pain   Past Medical History:  Diagnosis Date   Asthma    Endometriosis    Headache(784.0)    Heart murmur    Hypertension    Interstitial cystitis    Sickle cell trait (HCC)    Vitamin D insufficiency     Patient Active Problem List   Diagnosis Date Noted   Pelvic pain in female 08/31/2017   Urinary frequency 08/31/2017   Asthma 10/18/2010   H/O: hysterectomy 10/18/2010   Cystitis, interstitial 10/18/2010   Constipation 10/18/2010    Past Surgical History:  Procedure Laterality Date    ABDOMINAL HYSTERECTOMY     ANKLE SURGERY     BARTHOLIN GLAND CYST EXCISION     COLONOSCOPY     CYSTECTOMY     DILATION AND CURETTAGE OF UTERUS     EYE SURGERY     INGUINAL HERNIA REPAIR     LAPAROSCOPY     UPPER GI ENDOSCOPY      OB History     Gravida  3   Para  1   Term      Preterm  0   AB  2   Living  1      SAB  1   IAB  1   Ectopic      Multiple      Live Births  1            Home Medications    Prior to Admission medications   Medication Sig Start Date End Date Taking? Authorizing Provider  losartan (COZAAR) 50 MG tablet Take by mouth. 06/26/20  Yes [provider]  predniSONE (DELTASONE) 20 MG tablet Take 2 tablets (40 mg total) by mouth daily for 5 days. 10/25/20 10/30/20 Yes Lance Muss, FNP  albuterol (PROVENTIL HFA;VENTOLIN HFA) 108 (90 Base) MCG/ACT inhaler Inhale 1-2 puffs into the lungs every 6 (six) hours as needed for wheezing or shortness of breath. 03/25/18  Army Melia A, PA-C  albuterol (PROVENTIL) (2.5 MG/3ML) 0.083% nebulizer solution Take 3 mLs (2.5 mg total) by nebulization every 6 (six) hours as needed for wheezing or shortness of breath. 03/07/15   Dowless, Lelon Mast Tripp, PA-C  azelastine (ASTELIN) 0.1 % nasal spray Place 1 spray into both nostrils daily. Use in each nostril as directed    [provider]  cetirizine (ZYRTEC) 10 MG tablet Take 10 mg by mouth daily. For allergies    [provider]  cholecalciferol (VITAMIN D3) 25 MCG (1000 UNIT) tablet Take 1,000 Units by mouth daily.    [provider]  co-enzyme Q-10 30 MG capsule Take 30 mg by mouth daily.    [provider]  [facility] Liver Oil CAPS Take 1 capsule by mouth daily.    [provider]  docusate sodium (COLACE) 100 MG capsule Take 100 mg by mouth daily as needed for mild constipation.    [provider]  ezetimibe (ZETIA) 10 MG tablet Take 10 mg by mouth daily. 07/18/20   [provider]   HYDROcodone-acetaminophen (NORCO) 5-325 MG tablet Take 1 tablet by mouth every 6 (six) hours as needed. 05/23/19   Dartha Lodge, PA-C  ibuprofen (ADVIL) 600 MG tablet Take 1 tablet (600 mg total) by mouth every 6 (six) hours as needed. 05/23/19   Dartha Lodge, PA-C  ibuprofen (ADVIL) 800 MG tablet Take 1 tablet (800 mg total) by mouth every 8 (eight) hours as needed. 02/06/20   Vivi Barrack, DPM  losartan (COZAAR) 100 MG tablet Take 100 mg by mouth daily.     [provider]  naproxen (NAPROSYN) 500 MG tablet Take 1 tablet (500 mg total) by mouth 2 (two) times daily. 09/23/19   Hall-Potvin, [place], PA-C  Vitamin D, Ergocalciferol, (DRISDOL) 1.25 MG (50000 UNIT) CAPS capsule Take 1 capsule (50,000 Units total) by mouth every 7 (seven) days. 02/09/20   Vivi Barrack, DPM    Family History Family History  Problem Relation Age of Onset   Hypertension Mother    Fibromyalgia Mother    Hypertension Father    Vitamin D deficiency Father    Alzheimer's disease Maternal Grandmother    Prostate cancer Maternal Grandfather    Cirrhosis Paternal Grandmother    Hypertension Paternal Grandmother    Pneumonia Paternal Grandfather    Hypertension Paternal Grandfather    GER disease Brother    Hypertension Sister    Anxiety disorder Sister    Asthma Child    Pulmonary Hypertension Child    Gout Child     Social History Social History   Tobacco Use   Smoking status: Some Days    Pack years: 0.00    Types: Cigarettes   Smokeless tobacco: Never   Tobacco comments:    1-2 cig  Vaping Use   Vaping Use: Never used  Substance Use Topics   Alcohol use: Yes    Comment: socially   Drug use: No     Allergies   Amoxicillin-pot clavulanate, Avelox [moxifloxacin hydrochloride], Hydrochlorothiazide, Erythromycin, Penicillins, Aspirin, Gabapentin, Lisinopril, Oxycodone, Sulfa antibiotics, and Tessalon [benzonatate]   Review of Systems Review of Systems Per HPI  Physical  Exam Triage Vital Signs ED Triage Vitals  Enc Vitals Group     BP 10/25/20 1357 129/83     Pulse Rate 10/25/20 1357 75     Resp 10/25/20 1357 18     Temp 10/25/20 1357 98.1 F (36.7 C)     Temp  Source 10/25/20 1357 Oral     SpO2 10/25/20 1357 98 %     Weight --      Height --      Head Circumference --      Peak Flow --      Pain Score 10/25/20 1403 10     Pain Loc --      Pain Edu? --      Excl. in [place]? --    No data found.  Updated Vital Signs BP 129/83 (BP Location: Left Arm)   Pulse 75   Temp 98.1 F (36.7 C) (Oral)   Resp 18   SpO2 98%   Visual Acuity Right Eye Distance:   Left Eye Distance:   Bilateral Distance:    Right Eye Near:   Left Eye Near:    Bilateral Near:     Physical Exam Constitutional:      Appearance: Normal appearance.  HENT:     Head: Normocephalic and atraumatic.  Eyes:     Extraocular Movements: Extraocular movements intact.     Conjunctiva/sclera: Conjunctivae normal.  Cardiovascular:     Pulses:          Dorsalis pedis pulses are 3+ on the right side and 3+ on the left side.       Posterior tibial pulses are 3+ on the right side and 3+ on the left side.  Pulmonary:     Effort: Pulmonary effort is normal.  Musculoskeletal:     Right foot: Normal range of motion and normal capillary refill. Tenderness present. No swelling, deformity or foot drop. Normal pulse.     Left foot: Normal range of motion and normal capillary refill. Tenderness present. No swelling, deformity or foot drop. Normal pulse.     Comments: Tenderness to palpation throughout plantar surface of foot and including heel.  Normal dorsalis pedis pulse.  Feet:     Right foot:     Skin integrity: Skin integrity normal. No ulcer, blister, skin breakdown, erythema, warmth, callus or dry skin.     Toenail Condition: Right toenails are normal.     Left foot:     Skin integrity: Skin integrity normal. No ulcer, blister, skin breakdown, erythema, warmth, callus or dry skin.      Toenail Condition: Left toenails are normal.  Skin:    General: Skin is warm and dry.     Findings: No erythema.  Neurological:     General: No focal deficit present.     Mental Status: She is alert and oriented to person, place, and time. Mental status is at baseline.  Psychiatric:        Mood and Affect: Mood normal.        Behavior: Behavior normal.        Thought Content: Thought content normal.        Judgment: Judgment normal.     UC Treatments / Results  Labs (all labs ordered are listed, but only abnormal results are displayed) Labs Reviewed - No data to display  EKG   Radiology No results found.  Procedures Procedures (including critical care time)  Medications Ordered in UC Medications  ketorolac (TORADOL) injection 60 mg (60 mg Intramuscular Given 10/25/20 1454)    Initial Impression / Assessment and Plan / UC Course  I have reviewed the triage vital signs and the nursing notes.  Pertinent labs & imaging results that were available during my care of the patient were reviewed by me and considered in my  medical decision making (see chart for details).     Ketorolac injection administered in office to help alleviate pain.  5-day course of prednisone prescribed to help decrease inflammation that could be causing pain and back and into feet.  Patient was advised to not take any over-the-counter NSAIDs while taking prednisone. Patient was advised to follow-up with podiatry as soon as possible for further evaluation and management of plantar fasciitis.  Patient was given handout and educated about stretches and at home interventions to treat pain associated with [place] fasciitis.  Patient to restart wearing arch support shoes as well. Discussed strict return precautions. Patient verbalized understanding and is agreeable with plan.  Final Clinical Impressions(s) / UC Diagnoses   Final diagnoses:  Plantar fasciitis  Bilateral foot pain     Discharge  Instructions      You have been given ketorolac injection for pain in urgent care today.  You are also been treating with a course of oral steroids.  Please not take any ibuprofen, Advil, Aleve while on steroid.  May take Tylenol as needed.  Please see attached exercises and treatment for plantar fasciitis.  Please follow-up with your podiatrist as soon possible to schedule a follow-up appointment.     ED Prescriptions     Medication Sig Dispense Auth. Provider   predniSONE (DELTASONE) 20 MG tablet Take 2 tablets (40 mg total) by mouth daily for 5 days. 10 tablet Lance MussFowler, Jaelin Devincentis E, FNP      PDMP not reviewed this encounter.   Lance MussFowler, Marimar Suber E, FNP 10/25/20 [phone]

## 2020-10-25 NOTE — ED Triage Notes (Signed)
Greater than one week h/o pain with a numbing and burning sensation in the plantar surface of her bilateral feet. Standing, walking and bearing weight aggravate sxs.  Has been taking Ibuprofen without relief. No h/o DM.  Pt has been seen by [facility] and dx with neuropathy and plantar fasciatus. Pt also has a h/o scoliosis. No injuries or falls noted.

## 2020-11-04 ENCOUNTER — Ambulatory Visit: Payer: BLUE CROSS/BLUE SHIELD | Admitting: Podiatry

## 2020-11-13 ENCOUNTER — Encounter: Payer: Self-pay | Admitting: Neurology

## 2021-04-02 ENCOUNTER — Ambulatory Visit: Payer: BLUE CROSS/BLUE SHIELD | Admitting: Adult Health

## 2021-05-06 ENCOUNTER — Ambulatory Visit
Admission: EM | Admit: 2021-05-06 | Discharge: 2021-05-06 | Disposition: A | Discharge status: Home / Self Care | Payer: BLUE CROSS/BLUE SHIELD | Attending: Physician Assistant | Admitting: Physician Assistant

## 2021-05-06 ENCOUNTER — Encounter: Payer: Self-pay | Admitting: Physician Assistant

## 2021-05-06 ENCOUNTER — Other Ambulatory Visit: Payer: Self-pay

## 2021-05-06 DIAGNOSIS — R21 Rash and other nonspecific skin eruption: Secondary | ICD-10-CM

## 2021-05-06 DIAGNOSIS — W57XXXA Bitten or stung by nonvenomous insect and other nonvenomous arthropods, initial encounter: Secondary | ICD-10-CM

## 2021-05-06 MED ORDER — DOXYCYCLINE HYCLATE 100 MG PO CAPS: 100.0000 mg | ORAL_CAPSULE | Freq: Two times a day (BID) | ORAL | 0 refills | Status: AC

## 2021-05-06 MED ORDER — FLUCONAZOLE 150 MG PO TABS: ORAL_TABLET | ORAL | 0 refills | Status: DC

## 2021-05-06 MED ORDER — TRIAMCINOLONE ACETONIDE 0.1 % EX CREA: 1.0000 "application " | TOPICAL_CREAM | Freq: Two times a day (BID) | CUTANEOUS | 0 refills | Status: AC

## 2021-05-06 NOTE — ED Provider Notes (Signed)
[facility]    CSN: [id number] Arrival date & time: 05/06/21  1514      History   Chief Complaint Chief Complaint  Patient presents with   Rash    HPI Dawn Atkinson is a 51 y.o. female.   Patient here today for evaluation of possible rash or insect bites that started a few days ago. She states that she recently stayed in a hotel out of town with her mother for a funeral. She reports after leaving hotel she noticed insect bites to her left shoulder and upper arms as well as one lesion to her right lower leg. She notes that lesions are itching and burning. She has not had fever but does report feeling more fatigued. Mother has similar lesions. She reports that she has had cellulitis in her right lower leg in the past and is concerned because that lesion appears to be more red and swollen today.   The history is provided by the patient.   Past Medical History:  Diagnosis Date   Asthma    Endometriosis    Headache(784.0)    Heart murmur    Hypertension    Interstitial cystitis    Sickle cell trait ([place])    Vitamin D insufficiency     Patient Active Problem List   Diagnosis Date Noted   Pelvic pain in female 08/31/2017   Urinary frequency 08/31/2017   Asthma 10/18/2010   H/O: hysterectomy 10/18/2010   Cystitis, interstitial 10/18/2010   Constipation 10/18/2010    Past Surgical History:  Procedure Laterality Date   ABDOMINAL HYSTERECTOMY     ANKLE SURGERY     BARTHOLIN GLAND CYST EXCISION     COLONOSCOPY     CYSTECTOMY     DILATION AND CURETTAGE OF UTERUS     EYE SURGERY     INGUINAL HERNIA REPAIR     LAPAROSCOPY     UPPER GI ENDOSCOPY      OB History     Gravida  3   Para  1   Term      Preterm  0   AB  2   Living  1      SAB  1   IAB  1   Ectopic      Multiple      Live Births  1            Home Medications    Prior to Admission medications   Medication Sig Start Date End Date Taking? Authorizing Provider   doxycycline (VIBRAMYCIN) 100 MG capsule Take 1 capsule (100 mg total) by mouth 2 (two) times daily. 05/06/21  Yes Francene Finders, PA-C  fluconazole (DIFLUCAN) 150 MG tablet Take one tab with initial symptoms and one tab at end of antibiotic treatment. 05/06/21  Yes Francene Finders, PA-C  triamcinolone cream (KENALOG) 0.1 % Apply 1 application topically 2 (two) times daily. 05/06/21  Yes Francene Finders, PA-C  albuterol (PROVENTIL HFA;VENTOLIN HFA) 108 (90 Base) MCG/ACT inhaler Inhale 1-2 puffs into the lungs every 6 (six) hours as needed for wheezing or shortness of breath. 03/25/18   Tacy Learn, PA-C  albuterol (PROVENTIL) (2.5 MG/3ML) 0.083% nebulizer solution Take 3 mLs (2.5 mg total) by nebulization every 6 (six) hours as needed for wheezing or shortness of breath. 03/07/15   Dowless, Aldona Bar Tripp, PA-C  azelastine (ASTELIN) 0.1 % nasal spray Place 1 spray into both nostrils daily. Use in each nostril as directed  [provider]  cetirizine (ZYRTEC) 10 MG tablet Take 10 mg by mouth daily. For allergies    [provider]  cholecalciferol (VITAMIN D3) 25 MCG (1000 UNIT) tablet Take 1,000 Units by mouth daily.    [provider]  co-enzyme Q-10 30 MG capsule Take 30 mg by mouth daily.    [provider]  [facility] Liver Oil CAPS Take 1 capsule by mouth daily.    [provider]  docusate sodium (COLACE) 100 MG capsule Take 100 mg by mouth daily as needed for mild constipation.    [provider]  ezetimibe (ZETIA) 10 MG tablet Take 10 mg by mouth daily. 07/18/20   [provider]  HYDROcodone-acetaminophen (NORCO) 5-325 MG tablet Take 1 tablet by mouth every 6 (six) hours as needed. 05/23/19   Jacqlyn Larsen, PA-C  ibuprofen (ADVIL) 600 MG tablet Take 1 tablet (600 mg total) by mouth every 6 (six) hours as needed. 05/23/19   Jacqlyn Larsen, PA-C  ibuprofen (ADVIL) 800 MG tablet Take 1 tablet (800 mg total) by mouth every 8 (eight) hours  as needed. 02/06/20   Trula Slade, DPM  losartan (COZAAR) 100 MG tablet Take 100 mg by mouth daily.     [provider]  losartan (COZAAR) 50 MG tablet Take by mouth. 06/26/20   [provider]  naproxen (NAPROSYN) 500 MG tablet Take 1 tablet (500 mg total) by mouth 2 (two) times daily. 09/23/19   Hall-Potvin, [place], PA-C  Vitamin D, Ergocalciferol, (DRISDOL) 1.25 MG (50000 UNIT) CAPS capsule Take 1 capsule (50,000 Units total) by mouth every 7 (seven) days. 02/09/20   Trula Slade, DPM    Family History Family History  Problem Relation Age of Onset   Hypertension Mother    Fibromyalgia Mother    Hypertension Father    Vitamin D deficiency Father    Alzheimer's disease Maternal Grandmother    Prostate cancer Maternal Grandfather    Cirrhosis Paternal Grandmother    Hypertension Paternal Grandmother    Pneumonia Paternal Grandfather    Hypertension Paternal Grandfather    GER disease Brother    Hypertension Sister    Anxiety disorder Sister    Asthma Child    Pulmonary Hypertension Child    Gout Child     Social History Social History   Tobacco Use   Smoking status: Some Days    Types: Cigarettes   Smokeless tobacco: Never   Tobacco comments:    1-2 cig  Vaping Use   Vaping Use: Never used  Substance Use Topics   Alcohol use: Yes    Comment: socially   Drug use: No     Allergies   Amoxicillin-pot clavulanate, Avelox [moxifloxacin hydrochloride], Hydrochlorothiazide, Erythromycin, Penicillins, Aspirin, Gabapentin, Lisinopril, Oxycodone, Sulfa antibiotics, and Tessalon [benzonatate]   Review of Systems Review of Systems  Constitutional:  Negative for chills and fever.  Eyes:  Negative for discharge and redness.  Gastrointestinal:  Negative for abdominal pain, nausea and vomiting.  Skin:  Positive for rash.    Physical Exam Triage Vital Signs ED Triage Vitals  Enc Vitals Group     BP      Pulse      Resp      Temp      Temp  src      SpO2      Weight      Height      Head Circumference      Peak Flow  Pain Score      Pain Loc      Pain Edu?      Excl. in [place]?    No data found.  Updated Vital Signs BP (!) 181/96 (BP Location: Left Arm)    Pulse 85    Temp 98.7 F (37.1 C) (Oral)    Resp 18    SpO2 98%      Physical Exam Vitals and nursing note reviewed.  Constitutional:      General: She is not in acute distress.    Appearance: Normal appearance. She is not ill-appearing.  HENT:     Head: Normocephalic and atraumatic.  Eyes:     Conjunctiva/sclera: Conjunctivae normal.  Cardiovascular:     Rate and Rhythm: Normal rate.  Pulmonary:     Effort: Pulmonary effort is normal.  Skin:    Comments: Cluster of erythematous papules to left upper shoulder and upper left arm, one single erythematous papule to right lower leg with surrounding erythema- increased compared to picture patient provides of lesion yesterday  Neurological:     Mental Status: She is alert.  Psychiatric:        Mood and Affect: Mood normal.        Behavior: Behavior normal.        Thought Content: Thought content normal.     UC Treatments / Results  Labs (all labs ordered are listed, but only abnormal results are displayed) Labs Reviewed - No data to display  EKG   Radiology No results found.  Procedures Procedures (including critical care time)  Medications Ordered in UC Medications - No data to display  Initial Impression / Assessment and Plan / UC Course  I have reviewed the triage vital signs and the nursing notes.  Pertinent labs & imaging results that were available during my care of the patient were reviewed by me and considered in my medical decision making (see chart for details).    Suspect bed bugs but given appearance of lesion to leg will treat with antibiotic therapy as well as topical steroids. Patient requests diflucan as she typically gets yeast infections with antibiotic treatment.  Recommended follow up with any further concerns.   Final Clinical Impressions(s) / UC Diagnoses   Final diagnoses:  Insect bite, unspecified site, initial encounter     Discharge Instructions        Bites appear consistent with bed bug bites. Use steroid cream as prescribed. Follow up with any further concerns.        ED Prescriptions     Medication Sig Dispense Auth. Provider   triamcinolone cream (KENALOG) 0.1 % Apply 1 application topically 2 (two) times daily. 30 g Francene Finders, PA-C   doxycycline (VIBRAMYCIN) 100 MG capsule Take 1 capsule (100 mg total) by mouth 2 (two) times daily. 20 capsule Ewell Poe F, PA-C   fluconazole (DIFLUCAN) 150 MG tablet Take one tab with initial symptoms and one tab at end of antibiotic treatment. 2 tablet Francene Finders, PA-C      PDMP not reviewed this encounter.   Francene Finders, PA-C 05/06/21 1619

## 2021-05-06 NOTE — ED Triage Notes (Signed)
Pt here for possible rash or insect bites to leg, arm and neck

## 2021-05-06 NOTE — Discharge Instructions (Addendum)
° °  Bites appear consistent with bed bug bites. Use steroid cream as prescribed. Follow up with any further concerns.

## 2021-05-13 ENCOUNTER — Telehealth: Payer: Self-pay | Admitting: Neurology

## 2021-05-13 ENCOUNTER — Ambulatory Visit (INDEPENDENT_AMBULATORY_CARE_PROVIDER_SITE_OTHER): Payer: BLUE CROSS/BLUE SHIELD | Admitting: Neurology

## 2021-05-13 ENCOUNTER — Encounter: Payer: Self-pay | Admitting: Neurology

## 2021-05-13 VITALS — BP 158/93 | HR 70 | Ht 65.0 in | Wt 203.8 lb

## 2021-05-13 DIAGNOSIS — G4733 Obstructive sleep apnea (adult) (pediatric): Secondary | ICD-10-CM

## 2021-05-13 DIAGNOSIS — Z789 Other specified health status: Secondary | ICD-10-CM

## 2021-05-13 DIAGNOSIS — G4719 Other hypersomnia: Secondary | ICD-10-CM

## 2021-05-13 DIAGNOSIS — J351 Hypertrophy of tonsils: Secondary | ICD-10-CM

## 2021-05-13 DIAGNOSIS — R0683 Snoring: Secondary | ICD-10-CM | POA: Diagnosis not present

## 2021-05-13 DIAGNOSIS — Z9989 Dependence on other enabling machines and devices: Secondary | ICD-10-CM

## 2021-05-13 NOTE — Progress Notes (Signed)
Subjective:    Patient ID: Dawn Atkinson is a 51 y.o. female.  HPI    Interim history:   Dawn Atkinson is a 51 year old right-handed woman with an underlying medical history of sickle cell trait, interstitial cystitis, hypertension, heart murmur, endometriosis, asthma, vitamin D deficiency and overweight state, who presents for follow-up consultation of her obstructive sleep apnea, on treatment with autoPAP.  The patient is unaccompanied today. I last saw her on 09/26/20, at which time It she reported that lowering the pressure was more tolerable.  She still had intermittent bloating.  She was suboptimal with her 4-hour compliance.  Her AHI was borderline at 5.3.  She was encouraged to be fully compliant with treatment.  I did suggest we increase her maximum pressure from 10 cm to 11 cm.  She was alternating between a nasal cushion interface and a full facemask.  Today, 05/13/2021: I reviewed her AutoPap compliance data from 04/12/2021 through 05/11/2020, which is a total of 30 days, during which time she used her machine 25 days with percent use days greater than 4 hours at 53%, indicating suboptimal compliance, average usage of 5 hours and 15 minutes for days on treatment, residual AHI 3.3/h, 95th percentile of pressure at 10 cm, range of 4 to 11 cm, leak on the higher side with the 95th percentile at 18 L/min.  She reports having difficulty tolerating full facemask, she pulls it off in a panic in the middle of the night.  She has had ongoing issues with her tonsils, even tonsillar stones on the right side.  She has residual snoring which can be very loud.  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  She has difficulty breathing through her nose.  She is trying Astelin but had side effects on it.  She is trying nasal saline spray but could not tolerate the nasal rinse.  She is interested in speaking with ENT, she has heard about inspire and we talked about this today as a  possibility.  She is working on weight loss.  The patient's allergies, current medications, family history, past medical history, past social history, past surgical history and problem list were reviewed and updated as appropriate.    Previously:    I first met her at the request of her primary care PA on 01/25/2020, at which time she reported snoring and excessive daytime somnolence.  She was advised to proceed with a sleep study.  She had a home sleep test on 02/28/2020 which indicated severe obstructive sleep apnea with an AHI of 54.4/h, O2 nadir 83%.  She was advised to proceed with home AutoPap therapy.  Her set up date was 08/06/2020.   She emailed in the interim reporting that the pressure felt too high.  I reduced her minimum and maximum AutoPap pressure.   I reviewed her AutoPap compliance data from 08/26/2020 through 09/24/2020, which is a total of 30 days, during which time she used her machine 26 days with percent use days greater than 4 hours at 60%, indicating suboptimal compliance with an average usage for days on treatment of 5 hours and 24 minutes, residual AHI slightly borderline at 5.3/h, 95th percentile of pressure at 9.5 cm, range of 4 to 10 cm with EPR of 3, leak on the higher side intermittently with a 95th percentile at 21.5 L/min.      01/25/20: (She) reports snoring and excessive daytime somnolence.  I reviewed your office note from 11/24/2019.  Her Epworth sleepiness score is 15 out of 24 today, fatigue severity score is 50 out of 63. Her boyfriend has noted apneic pauses while she is asleep. She has had palpitations at times. She has woken up with a headache at times. She does have nocturia but also has a history of interstitial cystitis. Bedtime is generally around 6 or 7 PM as she has to get up early for work. Rise time is around 3 AM. She has to be at work typically at 4:45 AM. 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 Her  mother has sleep apnea and has a CPAP machine. Patient limits her caffeine to 1 serving per day on average and drinks alcohol occasionally, she is an occasional/social smoker. She has a history of bruxism and has talked to her dentist about it. She also reports that her dentist treats sleep apnea. She has seen an allergy specialist. She often wakes up with swelling in her face. She has noticed swelling in her tonsils and uvula at times. She had seen ENT before for her tonsillar hypertrophy but tonsillectomy was not recommended per se but considered as a possibility if needed.    Her Past Medical History Is Significant For: Past Medical History:  Diagnosis Date   Asthma    Endometriosis    Headache(784.0)    Heart murmur    Hypertension    Interstitial cystitis    Sickle cell trait (HCC)    Vitamin D insufficiency     Her Past Surgical History Is Significant For: Past Surgical History:  Procedure Laterality Date   ABDOMINAL HYSTERECTOMY     ANKLE SURGERY     BARTHOLIN GLAND CYST EXCISION     COLONOSCOPY     CYSTECTOMY     DILATION AND CURETTAGE OF UTERUS     EYE SURGERY     INGUINAL HERNIA REPAIR     LAPAROSCOPY     UPPER GI ENDOSCOPY      Her Family History Is Significant For: Family History  Problem Relation Age of Onset   Hypertension Mother    Fibromyalgia Mother    Sleep apnea Mother    Hypertension Father    Vitamin D deficiency Father    Hypertension Sister    Anxiety disorder Sister    GER disease Brother    Alzheimer's disease Maternal Grandmother    Prostate cancer Maternal Grandfather    Cirrhosis Paternal Grandmother    Hypertension Paternal Grandmother    Pneumonia Paternal Grandfather    Hypertension Paternal Grandfather    Asthma Child    Pulmonary Hypertension Child    Gout Child     Her Social History Is Significant For: Social History   Socioeconomic History   Marital status: Divorced    Spouse name: Not on file   Number of children: Not on  file   Years of education: Not on file   Highest education level: Not on file  Occupational History   Not on file  Tobacco Use   Smoking status: Some Days    Packs/day: 0.25    Types: Cigarettes   Smokeless tobacco: Never   Tobacco comments:    1-2 cig  Vaping Use   Vaping Use: Never used  Substance and Sexual Activity   Alcohol use: Yes    Alcohol/week: 4.0 standard drinks    Types: 4 Glasses of wine per week    Comment: socially   Drug  use: No   Sexual activity: Not Currently    Birth control/protection: Surgical  Other Topics Concern   Not on file  Social History Narrative   Not on file   Social Determinants of Health   Financial Resource Strain: Not on file  Food Insecurity: Not on file  Transportation Needs: Not on file  Physical Activity: Not on file  Stress: Not on file  Social Connections: Not on file    Her Allergies Are:  Allergies  Allergen Reactions   Amoxicillin-Pot Clavulanate Anaphylaxis    Has patient had a PCN reaction causing immediate rash, facial/tongue/throat swelling, SOB or lightheadedness with hypotension: Unknown.  Pt thinks, but cannot confirm rash.  Has patient had a PCN reaction causing severe rash involving mucus membranes or skin necrosis: Unknown Has patient had a PCN reaction that required hospitalization No Has patient had a PCN reaction occurring within the last 10 years: No If all of the above answers are "NO", then may proceed with Cephalosporin use.    Avelox [Moxifloxacin Hydrochloride] Anaphylaxis   Hydrochlorothiazide Anaphylaxis and Other (See Comments)    hypotension   Erythromycin Other (See Comments)    Unknown   Penicillins    Aspirin Diarrhea    childhood   Gabapentin Other (See Comments)    hallucinations    Lisinopril Other (See Comments)    Induced patient asthma   Oxycodone Hives    "tries to dig skin out"   Sulfa Antibiotics Hives   Tessalon [Benzonatate] Other (See Comments)    WHOOPING COUGH   :    Her Current Medications Are:  Outpatient Encounter Medications as of 05/13/2021  Medication Sig   albuterol (PROVENTIL HFA;VENTOLIN HFA) 108 (90 Base) MCG/ACT inhaler Inhale 1-2 puffs into the lungs every 6 (six) hours as needed for wheezing or shortness of breath.   albuterol (PROVENTIL) (2.5 MG/3ML) 0.083% nebulizer solution Take 3 mLs (2.5 mg total) by nebulization every 6 (six) hours as needed for wheezing or shortness of breath.   azelastine (ASTELIN) 0.1 % nasal spray Place 1 spray into both nostrils daily. Use in each nostril as directed   cetirizine (ZYRTEC) 10 MG tablet Take 10 mg by mouth daily. For allergies   co-enzyme Q-10 30 MG capsule Take 30 mg by mouth daily.   Cod Liver Oil CAPS Take 1 capsule by mouth daily.   docusate sodium (COLACE) 100 MG capsule Take 100 mg by mouth daily as needed for mild constipation.   doxycycline (VIBRAMYCIN) 100 MG capsule Take 1 capsule (100 mg total) by mouth 2 (two) times daily.   ezetimibe (ZETIA) 10 MG tablet Take 10 mg by mouth daily.   fluconazole (DIFLUCAN) 150 MG tablet Take one tab with initial symptoms and one tab at end of antibiotic treatment.   ibuprofen (ADVIL) 600 MG tablet Take 1 tablet (600 mg total) by mouth every 6 (six) hours as needed.   ibuprofen (ADVIL) 800 MG tablet Take 1 tablet (800 mg total) by mouth every 8 (eight) hours as needed.   losartan (COZAAR) 50 MG tablet Take by mouth.   triamcinolone cream (KENALOG) 0.1 % Apply 1 application topically 2 (two) times daily.   cholecalciferol (VITAMIN D3) 25 MCG (1000 UNIT) tablet Take 1,000 Units by mouth daily.   HYDROcodone-acetaminophen (NORCO) 5-325 MG tablet Take 1 tablet by mouth every 6 (six) hours as needed.   losartan (COZAAR) 100 MG tablet Take 100 mg by mouth daily.    naproxen (NAPROSYN) 500 MG tablet Take 1  tablet (500 mg total) by mouth 2 (two) times daily.   Vitamin D, Ergocalciferol, (DRISDOL) 1.25 MG (50000 UNIT) CAPS capsule Take 1 capsule (50,000 Units  total) by mouth every 7 (seven) days.   No facility-administered encounter medications on file as of 05/13/2021.  :  Review of Systems:  Out of a complete 14 point review of systems, all are reviewed and negative with the exception of these symptoms as listed below:  Review of Systems  Neurological:        Pt is here for CPAP follow up . Pt states Cpap is not going well . Pt states that mask does not fit . Pt states she still snores and her stomach is descended some days. Pt states she still has headaches in the am. Pt feels she has to much aur  pressure on the machine . Pt states she is still fatigue throughout the day    Objective:  Neurological Exam  Physical Exam Physical Examination:   Vitals:   05/13/21 0840  BP: (!) 158/93  Pulse: 70    General Examination: The patient is a very pleasant 51 y.o. female in no acute distress. She appears well-developed and well-nourished and well groomed.   HEENT: Normocephalic, atraumatic, pupils are equal, round and reactive to light, extraocular tracking is good without limitation to gaze excursion or nystagmus noted. Hearing is grossly intact. Face is symmetric with normal facial animation. Speech is clear with no dysarthria noted. There is no hypophonia. There is no lip, neck/head, jaw or voice tremor. Neck is supple with full range of passive and active motion. There are no carotid bruits on auscultation. Oropharynx exam reveals: mild mouth dryness, good dental hygiene and moderate airway crowding, due to small airway entry, tonsillar size of about 3+ bilaterally, right side a little bigger than left, no obvious tonsillar stones..  Nasal inspection reveals mucosal swelling and inferior turbinate hypertrophy.   Chest: Clear to auscultation without wheezing, rhonchi or crackles noted.   Heart: S1+S2+0, regular and normal without murmurs, rubs or gallops noted.   Abdomen: Soft, non-tender and non-distended.   Extremities: There is nonpitting  puffiness noted around both ankles.   Skin: Warm and dry without new changes.     Musculoskeletal: exam reveals no obvious joint deformities.   Neurologically: Mental status: The patient is awake, alert and oriented in all 4 spheres. Her immediate and remote memory, attention, language skills and fund of knowledge are appropriate. There is no evidence of aphasia, agnosia, apraxia or anomia. Speech is clear with normal prosody and enunciation. Thought process is linear. Mood is normal and affect is normal. Cranial nerves II - XII are as described above under HEENT exam. Motor exam: Normal bulk, strength and tone is noted. There is no tremor, fine motor skills and coordination: grossly intact. Cerebellar testing: No dysmetria or intention tremor. There is no truncal or gait ataxia. Sensory exam: intact to light touch in the upper and lower extremities. Gait, station and balance: She stands easily. No veering to one side is noted. No leaning to one side is noted. Posture is age-appropriate and stance is narrow based. Gait shows normal stride length and normal pace. No problems turning are noted.    Assessment and Plan:  In summary, Dawn Atkinson is a very pleasant 51 year old female with an underlying medical history of sickle cell trait, interstitial cystitis, hypertension, heart murmur, endometriosis, asthma, vitamin D deficiency and overweight state, who presents for follow-up consultation of her obstructive sleep  apnea, on AutoPap therapy.  She is still struggling with tolerance of the AutoPap, reports residual sleepiness, residual significant snoring, difficulty with her fullface mask and difficulty breathing through her nose. Her home sleep test from 02/28/2020 indicated severe obstructive sleep apnea with an AHI of 54.4/h, O2 nadir 83%.  Her AutoPap set up date was 08/06/2020.  She had trouble initially with tolerance of the AutoPap pressure.  We reduced the pressure settings, she tolerated the  pressure little bit better.  And she has been able to tolerate the treatment better.  I increased her maximum pressure in June 2022 to 11 cm.  She is agreeable to trying a slightly higher pressure with the maximum at 12 cm at this time.  She feels that she could tolerate a higher pressure and is really bothered by the residual snoring.  We mutually agreed to seek ENT consultation for surgical intervention candidacy such as tonsillectomy and inferior turbinate reduction versus her candidacy for inspire potentially.  She is advised to continue to work on weight loss.  She may be a candidate for inspire if the need arises.  She is willing to continue with AutoPap for now, she is advised that when she uses her AutoPap it does have her apnea scores in the goal range.  She reports that sometimes the machine does not seem to register her usage.  She is encouraged to talk to her DME provider about this issue.  She is advised to follow-up after her ENT consultation, I placed a referral to ENT today.  She is agreeable to our plan.  She is encouraged to call our office once she has had ENT consultation.  I answered all her questions today and she was in agreement.  I spent 30 minutes in total face-to-face time and in reviewing records during pre-charting, more than 50% of which was spent in counseling and coordination of care, reviewing test results, reviewing medications and treatment regimen and/or in discussing or reviewing the diagnosis of OSA, the prognosis and treatment options. Pertinent laboratory and imaging test results that were available during this visit with the patient were reviewed by me and considered in my medical decision making (see chart for details).

## 2021-05-13 NOTE — Telephone Encounter (Signed)
ENT referral has been sent to [facility] (Dr. Normajean Glasgow. Annalee Genta). Phone: [phone].

## 2021-06-16 ENCOUNTER — Ambulatory Visit: Payer: BLUE CROSS/BLUE SHIELD | Admitting: Neurology

## 2021-10-01 ENCOUNTER — Ambulatory Visit: Admit: 2021-10-01 | Payer: BLUE CROSS/BLUE SHIELD | Admitting: Otolaryngology

## 2021-10-01 SURGERY — TONSILLECTOMY
Anesthesia: General | Laterality: Bilateral

## 2022-01-05 ENCOUNTER — Emergency Department (HOSPITAL_COMMUNITY)
Admission: EM | Admit: 2022-01-05 | Discharge: 2022-01-05 | Disposition: A | Discharge status: Home / Self Care | Payer: BLUE CROSS/BLUE SHIELD | Attending: Emergency Medicine | Admitting: Emergency Medicine

## 2022-01-05 ENCOUNTER — Encounter (HOSPITAL_COMMUNITY): Payer: Self-pay

## 2022-01-05 ENCOUNTER — Other Ambulatory Visit: Payer: Self-pay

## 2022-01-05 DIAGNOSIS — U071 COVID-19: Secondary | ICD-10-CM | POA: Diagnosis not present

## 2022-01-05 DIAGNOSIS — R0602 Shortness of breath: Secondary | ICD-10-CM | POA: Diagnosis present

## 2022-01-05 LAB — RESP PANEL BY RT-PCR (FLU A&B, COVID) ARPGX2
Influenza A by PCR: NEGATIVE
Influenza B by PCR: NEGATIVE
SARS Coronavirus 2 by RT PCR: NEGATIVE

## 2022-01-05 NOTE — Discharge Instructions (Signed)
Your COVID test results will be available on the patient portal.  Return to the emergency department for worsening symptoms.

## 2022-01-05 NOTE — ED Provider Notes (Signed)
[facility]-EMERGENCY DEPT Provider Note   CSN: [id number] Arrival date & time: 01/05/22  1108     History  Chief Complaint  Patient presents with   Shortness of Breath    Dawn Atkinson is a 51 y.o. female.   Shortness of Breath    51 year old female presenting to the emergency department for COVID testing.  The patient states that her boyfriend tested positive for COVID today.  She endorses mild shortness of breath, declines any cough, fever or chills.  She states that she has had a small rash in her bilateral extremities, denies any involvement of the palms and soles.  She denies any other infectious symptoms at this time.  Home Medications Prior to Admission medications   Medication Sig Start Date End Date Taking? Authorizing Provider  albuterol (PROVENTIL HFA;VENTOLIN HFA) 108 (90 Base) MCG/ACT inhaler Inhale 1-2 puffs into the lungs every 6 (six) hours as needed for wheezing or shortness of breath. 03/25/18   Jeannie Fend, PA-C  albuterol (PROVENTIL) (2.5 MG/3ML) 0.083% nebulizer solution Take 3 mLs (2.5 mg total) by nebulization every 6 (six) hours as needed for wheezing or shortness of breath. 03/07/15   Dowless, Lelon Mast Tripp, PA-C  azelastine (ASTELIN) 0.1 % nasal spray Place 1 spray into both nostrils daily. Use in each nostril as directed    [provider]  cetirizine (ZYRTEC) 10 MG tablet Take 10 mg by mouth daily. For allergies    [provider]  cholecalciferol (VITAMIN D3) 25 MCG (1000 UNIT) tablet Take 1,000 Units by mouth daily.    [provider]  co-enzyme Q-10 30 MG capsule Take 30 mg by mouth daily.    [provider]  [facility] Liver Oil CAPS Take 1 capsule by mouth daily.    [provider]  docusate sodium (COLACE) 100 MG capsule Take 100 mg by mouth daily as needed for mild constipation.    [provider]  doxycycline (VIBRAMYCIN) 100 MG capsule Take 1 capsule (100 mg total) by mouth  2 (two) times daily. 05/06/21   Tomi Bamberger, PA-C  ezetimibe (ZETIA) 10 MG tablet Take 10 mg by mouth daily. 07/18/20   [provider]  fluconazole (DIFLUCAN) 150 MG tablet Take one tab with initial symptoms and one tab at end of antibiotic treatment. 05/06/21   Tomi Bamberger, PA-C  HYDROcodone-acetaminophen (NORCO) 5-325 MG tablet Take 1 tablet by mouth every 6 (six) hours as needed. 05/23/19   Dartha Lodge, PA-C  ibuprofen (ADVIL) 600 MG tablet Take 1 tablet (600 mg total) by mouth every 6 (six) hours as needed. 05/23/19   Dartha Lodge, PA-C  ibuprofen (ADVIL) 800 MG tablet Take 1 tablet (800 mg total) by mouth every 8 (eight) hours as needed. 02/06/20   Vivi Barrack, DPM  losartan (COZAAR) 100 MG tablet Take 100 mg by mouth daily.     [provider]  losartan (COZAAR) 50 MG tablet Take by mouth. 06/26/20   [provider]  naproxen (NAPROSYN) 500 MG tablet Take 1 tablet (500 mg total) by mouth 2 (two) times daily. 09/23/19   Hall-Potvin, [place], PA-C  triamcinolone cream (KENALOG) 0.1 % Apply 1 application topically 2 (two) times daily. 05/06/21   Tomi Bamberger, PA-C  Vitamin D, Ergocalciferol, (DRISDOL) 1.25 MG (50000 UNIT) CAPS capsule Take 1 capsule (50,000 Units total) by mouth every 7 (seven) days. 02/09/20   Vivi Barrack, DPM      Allergies  Amoxicillin-pot clavulanate, Avelox [moxifloxacin hydrochloride], Hydrochlorothiazide, Atorvastatin, Erythromycin, Penicillins, Aspirin, Gabapentin, Lisinopril, Oxycodone, Sulfa antibiotics, and Tessalon [benzonatate]    Review of Systems   Review of Systems  Respiratory:  Positive for shortness of breath.   All other systems reviewed and are negative.   Physical Exam Updated Vital Signs BP (!) 186/110 (BP Location: Left Arm)   Pulse 90   Temp 98.9 F (37.2 C) (Oral)   Resp 18   Ht 5\' 5"  (1.651 m)   Wt 92 kg   SpO2 100%   BMI 33.75 kg/m  Physical Exam Vitals and nursing note reviewed.   Constitutional:      General: She is not in acute distress. HENT:     Head: Normocephalic and atraumatic.  Eyes:     Conjunctiva/sclera: Conjunctivae normal.     Pupils: Pupils are equal, round, and reactive to light.  Cardiovascular:     Rate and Rhythm: Normal rate and regular rhythm.  Pulmonary:     Effort: Pulmonary effort is normal. No respiratory distress.     Breath sounds: Normal breath sounds.  Abdominal:     General: There is no distension.     Tenderness: There is no guarding.  Musculoskeletal:        General: No deformity or signs of injury.     Cervical back: Neck supple.  Skin:    Findings: No lesion or rash.     Comments: No appreciable rash noted  Neurological:     General: No focal deficit present.     Mental Status: She is alert. Mental status is at baseline.     ED Results / Procedures / Treatments   Labs (all labs ordered are listed, but only abnormal results are displayed) Labs Reviewed  RESP PANEL BY RT-PCR (FLU A&B, COVID) [id number]    EKG None  Radiology No results found.  Procedures Procedures    Medications Ordered in ED Medications - No data to display  ED Course/ Medical Decision Making/ A&P                           Medical Decision Making   51 year old female presenting to the emergency department for COVID testing.  The patient states that her boyfriend tested positive for COVID today.  She endorses mild shortness of breath, declines any cough, fever or chills.  She states that she has had a small rash in her bilateral extremities, denies any involvement of the palms and soles.  She denies any other infectious symptoms at this time.  Ambulatory pulse oximetry revealed the patient maintained her saturations to 1 high percent on ambulation around the room.  She is afebrile, hemodynamically stable, well-appearing.  Lungs clear to auscultation bilaterally.  No productive cough. Presenting for COVID-19 testing in the setting of positive  exposure.  PCR testing collected and results pending at time of discharge.  Return precautions were provided.  Final Clinical Impression(s) / ED Diagnoses Final diagnoses:  [id number]    Rx / DC Orders ED Discharge Orders     None         Regan Lemming, MD 01/05/22 1243

## 2022-01-05 NOTE — ED Triage Notes (Signed)
Pt reports boyfriend positive for COVID and requesting test. Pt reports shob x1 day

## 2022-01-08 ENCOUNTER — Emergency Department (HOSPITAL_COMMUNITY)
Admission: EM | Admit: 2022-01-08 | Discharge: 2022-01-08 | Disposition: A | Discharge status: Home / Self Care | Payer: BLUE CROSS/BLUE SHIELD | Attending: Emergency Medicine | Admitting: Emergency Medicine

## 2022-01-08 ENCOUNTER — Emergency Department (HOSPITAL_COMMUNITY): Payer: BLUE CROSS/BLUE SHIELD

## 2022-01-08 ENCOUNTER — Other Ambulatory Visit: Payer: Self-pay

## 2022-01-08 ENCOUNTER — Encounter (HOSPITAL_COMMUNITY): Payer: Self-pay

## 2022-01-08 DIAGNOSIS — R0981 Nasal congestion: Secondary | ICD-10-CM | POA: Diagnosis present

## 2022-01-08 DIAGNOSIS — U071 COVID-19: Secondary | ICD-10-CM

## 2022-01-08 DIAGNOSIS — R112 Nausea with vomiting, unspecified: Secondary | ICD-10-CM | POA: Insufficient documentation

## 2022-01-08 DIAGNOSIS — M791 Myalgia, unspecified site: Secondary | ICD-10-CM | POA: Insufficient documentation

## 2022-01-08 LAB — CBC WITH DIFFERENTIAL/PLATELET
Abs Immature Granulocytes: 0.03 10*3/uL (ref 0.00–0.07)
Basophils Absolute: 0 10*3/uL (ref 0.0–0.1)
Basophils Relative: 1 %
Eosinophils Absolute: 0 10*3/uL (ref 0.0–0.5)
Eosinophils Relative: 0 %
HCT: 37.6 % (ref 36.0–46.0)
Hemoglobin: 13 g/dL (ref 12.0–15.0)
Immature Granulocytes: 1 %
Lymphocytes Relative: 19 %
Lymphs Abs: 1.2 10*3/uL (ref 0.7–4.0)
MCH: 28.3 pg (ref 26.0–34.0)
MCHC: 34.6 g/dL (ref 30.0–36.0)
MCV: 81.7 fL (ref 80.0–100.0)
Monocytes Absolute: 1.3 10*3/uL — ABNORMAL HIGH (ref 0.1–1.0)
Monocytes Relative: 20 %
Neutro Abs: 3.9 10*3/uL (ref 1.7–7.7)
Neutrophils Relative %: 59 %
Platelets: 240 10*3/uL (ref 150–400)
RBC: 4.6 MIL/uL (ref 3.87–5.11)
RDW: 14.6 % (ref 11.5–15.5)
WBC: 6.4 10*3/uL (ref 4.0–10.5)
nRBC: 0 % (ref 0.0–0.2)

## 2022-01-08 LAB — BASIC METABOLIC PANEL
Anion gap: 5 (ref 5–15)
BUN: 10 mg/dL (ref 6–20)
CO2: 24 mmol/L (ref 22–32)
Calcium: 8.7 mg/dL — ABNORMAL LOW (ref 8.9–10.3)
Chloride: 108 mmol/L (ref 98–111)
Creatinine, Ser: 0.66 mg/dL (ref 0.44–1.00)
GFR, Estimated: >60 mL/min (ref >60)
Glucose, Bld: 102 mg/dL — ABNORMAL HIGH (ref 70–99)
Potassium: 3.9 mmol/L (ref 3.5–5.1)
Sodium: 137 mmol/L (ref 135–145)

## 2022-01-08 MED ORDER — GUAIFENESIN-CODEINE 100-10 MG/5ML PO SOLN: 5.0000 mL | Freq: Three times a day (TID) | ORAL | 0 refills | Status: AC | PRN

## 2022-01-08 MED ORDER — ONDANSETRON 4 MG PO TBDP
4.0000 mg | ORAL_TABLET | Freq: Once | ORAL | Status: AC
  Administered 2022-01-08: 4 mg via ORAL
  Filled 2022-01-08: qty 1

## 2022-01-08 MED ORDER — ACETAMINOPHEN 500 MG PO TABS
1000.0000 mg | ORAL_TABLET | Freq: Four times a day (QID) | ORAL | Status: DC | PRN
  Administered 2022-01-08 (×2): 1000 mg via ORAL
  Filled 2022-01-08 (×2): qty 2

## 2022-01-08 MED ORDER — KETOROLAC TROMETHAMINE 60 MG/2ML IM SOLN
30.0000 mg | Freq: Once | INTRAMUSCULAR | Status: AC
  Administered 2022-01-08: 30 mg via INTRAMUSCULAR
  Filled 2022-01-08: qty 2

## 2022-01-08 NOTE — ED Provider Triage Note (Signed)
Emergency Medicine Provider Triage Evaluation Note  Dawn Atkinson , a 51 y.o. female  was evaluated in triage.  Pt complains of concerns for productive cough onset yesterday.  Was around someone with similar symptoms who was diagnosed with COVID.  Patient tested positive for COVID.  Has a history of hypertension hyperlipidemia has not taken her medications since 9/19.  Denies history of diabetes.  Has generalized body aches, rhinorrhea, nasal congestion nausea.  Denies chest pain, vomiting, abdominal pain, sore throat..  Review of Systems  Positive:  Negative:   Physical Exam  BP (!) 140/96 (BP Location: Left Arm)   Pulse 96   Temp (!) 101.8 F (38.8 C) (Oral)   Resp 18   Ht 5\' 5"  (1.651 m)   Wt 91.6 kg   SpO2 98%   BMI 33.61 kg/m  Gen:   Awake, no distress   Resp:  Normal effort  MSK:   Moves extremities without difficulty  Other:    Medical Decision Making  Medically screening exam initiated at 4:11 PM.  Appropriate orders placed.  Dawn Atkinson was informed that the remainder of the evaluation will be completed by another provider, this initial triage assessment does not replace that evaluation, and the importance of remaining in the ED until their evaluation is complete.  Work-up initiated   Dawn Atkinson A, PA-C 01/08/22 1612

## 2022-01-08 NOTE — ED Provider Notes (Signed)
[facility] EMERGENCY DEPARTMENT Provider Note   CSN: [id number] Arrival date & time: 01/08/22  1518     History  Chief Complaint  Patient presents with   Covid Positive    Dawn Atkinson is a 51 y.o. female.  51 year old female presents with URI symptoms x2 days.  Recently tested positive for COVID at home.  Has had nausea and posttussive emesis.  Denies any diarrhea.  No severe shortness of breath.  Has had nasal congestion.  No pleuritic chest pain.  Has been using over-the-counter medications without relief       Home Medications Prior to Admission medications   Medication Sig Start Date End Date Taking? Authorizing Provider  albuterol (PROVENTIL HFA;VENTOLIN HFA) 108 (90 Base) MCG/ACT inhaler Inhale 1-2 puffs into the lungs every 6 (six) hours as needed for wheezing or shortness of breath. 03/25/18   Jeannie Fend, PA-C  albuterol (PROVENTIL) (2.5 MG/3ML) 0.083% nebulizer solution Take 3 mLs (2.5 mg total) by nebulization every 6 (six) hours as needed for wheezing or shortness of breath. 03/07/15   Dowless, Lelon Mast Tripp, PA-C  azelastine (ASTELIN) 0.1 % nasal spray Place 1 spray into both nostrils daily. Use in each nostril as directed    [provider]  cetirizine (ZYRTEC) 10 MG tablet Take 10 mg by mouth daily. For allergies    [provider]  cholecalciferol (VITAMIN D3) 25 MCG (1000 UNIT) tablet Take 1,000 Units by mouth daily.    [provider]  co-enzyme Q-10 30 MG capsule Take 30 mg by mouth daily.    [provider]  [facility] Liver Oil CAPS Take 1 capsule by mouth daily.    [provider]  docusate sodium (COLACE) 100 MG capsule Take 100 mg by mouth daily as needed for mild constipation.    [provider]  doxycycline (VIBRAMYCIN) 100 MG capsule Take 1 capsule (100 mg total) by mouth 2 (two) times daily. 05/06/21   Tomi Bamberger, PA-C  ezetimibe (ZETIA) 10 MG tablet Take 10 mg by mouth daily.  07/18/20   [provider]  fluconazole (DIFLUCAN) 150 MG tablet Take one tab with initial symptoms and one tab at end of antibiotic treatment. 05/06/21   Tomi Bamberger, PA-C  HYDROcodone-acetaminophen (NORCO) 5-325 MG tablet Take 1 tablet by mouth every 6 (six) hours as needed. 05/23/19   Dartha Lodge, PA-C  ibuprofen (ADVIL) 600 MG tablet Take 1 tablet (600 mg total) by mouth every 6 (six) hours as needed. 05/23/19   Dartha Lodge, PA-C  ibuprofen (ADVIL) 800 MG tablet Take 1 tablet (800 mg total) by mouth every 8 (eight) hours as needed. 02/06/20   Vivi Barrack, DPM  losartan (COZAAR) 100 MG tablet Take 100 mg by mouth daily.     [provider]  losartan (COZAAR) 50 MG tablet Take by mouth. 06/26/20   [provider]  naproxen (NAPROSYN) 500 MG tablet Take 1 tablet (500 mg total) by mouth 2 (two) times daily. 09/23/19   Hall-Potvin, [place], PA-C  triamcinolone cream (KENALOG) 0.1 % Apply 1 application topically 2 (two) times daily. 05/06/21   Tomi Bamberger, PA-C  Vitamin D, Ergocalciferol, (DRISDOL) 1.25 MG (50000 UNIT) CAPS capsule Take 1 capsule (50,000 Units total) by mouth every 7 (seven) days. 02/09/20   Vivi Barrack, DPM      Allergies    Amoxicillin-pot clavulanate, Avelox [moxifloxacin hydrochloride], Hydrochlorothiazide, Atorvastatin, Erythromycin, Penicillins, Aspirin, Gabapentin, Lisinopril, Oxycodone, Sulfa antibiotics, and  Tessalon [benzonatate]    Review of Systems   Review of Systems  All other systems reviewed and are negative.   Physical Exam Updated Vital Signs BP 132/86 (BP Location: Left Arm)   Pulse 89   Temp 99.7 F (37.6 C) (Oral)   Resp 16   Ht 1.651 m (5\' 5" )   Wt 91.6 kg   SpO2 97%   BMI 33.61 kg/m  Physical Exam Vitals and nursing note reviewed.  Constitutional:      General: She is not in acute distress.    Appearance: Normal appearance. She is well-developed. She is not toxic-appearing.  HENT:     Head:  Normocephalic and atraumatic.  Eyes:     General: Lids are normal.     Conjunctiva/sclera: Conjunctivae normal.     Pupils: Pupils are equal, round, and reactive to light.  Neck:     Thyroid: No thyroid mass.     Trachea: No tracheal deviation.  Cardiovascular:     Rate and Rhythm: Normal rate and regular rhythm.     Heart sounds: Normal heart sounds. No murmur heard.    No gallop.  Pulmonary:     Effort: Pulmonary effort is normal. No respiratory distress.     Breath sounds: Normal breath sounds. No stridor. No decreased breath sounds, wheezing, rhonchi or rales.  Abdominal:     General: There is no distension.     Palpations: Abdomen is soft.     Tenderness: There is no abdominal tenderness. There is no rebound.  Musculoskeletal:        General: No tenderness. Normal range of motion.     Cervical back: Normal range of motion and neck supple.  Skin:    General: Skin is warm and dry.     Findings: No abrasion or rash.  Neurological:     Mental Status: She is alert and oriented to person, place, and time. Mental status is at baseline.     GCS: GCS eye subscore is 4. GCS verbal subscore is 5. GCS motor subscore is 6.     Cranial Nerves: No cranial nerve deficit.     Sensory: No sensory deficit.     Motor: Motor function is intact.  Psychiatric:        Attention and Perception: Attention normal.        Speech: Speech normal.        Behavior: Behavior normal.     ED Results / Procedures / Treatments   Labs (all labs ordered are listed, but only abnormal results are displayed) Labs Reviewed  BASIC METABOLIC PANEL - Abnormal; Notable for the following components:      Result Value   Glucose, Bld 102 (*)    Calcium 8.7 (*)    All other components within normal limits  CBC WITH DIFFERENTIAL/PLATELET - Abnormal; Notable for the following components:   Monocytes Absolute 1.3 (*)    All other components within normal limits    EKG None  Radiology DG Chest 2 View  Result  Date: 01/08/2022 CLINICAL DATA:  COVID-19 positive, body aches, shortness of breath, cough with sputum, febrile EXAM: CHEST - 2 VIEW COMPARISON:  10/13/2020 FINDINGS: Borderline enlargement of cardiac silhouette. Mediastinal contours and pulmonary vascularity normal. Atherosclerotic calcification aorta. Lungs clear. No acute infiltrate, pleural effusion, or pneumothorax. Posterior and trace shin versus hernia of RIGHT diaphragm again noted. No acute osseous findings. IMPRESSION: No acute infiltrate. Aortic Atherosclerosis (ICD10-I70.0). Electronically Signed   By: Crist Infante.D.  On: 01/08/2022 17:05    Procedures Procedures    Medications Ordered in ED Medications  acetaminophen (TYLENOL) tablet 1,000 mg (1,000 mg Oral Given 01/08/22 1602)  ketorolac (TORADOL) injection 30 mg (has no administration in time range)  ondansetron (ZOFRAN-ODT) disintegrating tablet 4 mg (4 mg Oral Given 01/08/22 1643)    ED Course/ Medical Decision Making/ A&P                           Medical Decision Making Risk OTC drugs. Prescription drug management.   Patient's chest x-ray per my interpretation shows no acute infiltrate.  No leukocytosis noted on CBC.  Low suspicion for sepsis.  Electrolytes within normal limits.  Given dose of Toradol here for her myalgias.  We will also give her Zofran as well 2.  Offered antivirals and she has deferred.  Will place patient on antitussives give work note.  No indication for admission at this time        Final Clinical Impression(s) / ED Diagnoses Final diagnoses:  None    Rx / DC Orders ED Discharge Orders     None         Lorre Nick, MD 01/08/22 2248

## 2022-01-08 NOTE — ED Triage Notes (Signed)
Reports exposed to friend who was positive.  Yesterday morning started having bodyaches and took home covid was +.  Complains of sob and cough with sputum.  Febrile.

## 2022-01-11 ENCOUNTER — Emergency Department (HOSPITAL_COMMUNITY): Payer: BLUE CROSS/BLUE SHIELD

## 2022-01-11 ENCOUNTER — Emergency Department (HOSPITAL_COMMUNITY)
Admission: EM | Admit: 2022-01-11 | Discharge: 2022-01-11 | Disposition: A | Discharge status: Home / Self Care | Payer: BLUE CROSS/BLUE SHIELD | Attending: Emergency Medicine | Admitting: Emergency Medicine

## 2022-01-11 ENCOUNTER — Encounter (HOSPITAL_COMMUNITY): Payer: Self-pay

## 2022-01-11 DIAGNOSIS — Z8616 Personal history of COVID-19: Secondary | ICD-10-CM | POA: Diagnosis not present

## 2022-01-11 DIAGNOSIS — Z79899 Other long term (current) drug therapy: Secondary | ICD-10-CM | POA: Insufficient documentation

## 2022-01-11 DIAGNOSIS — N390 Urinary tract infection, site not specified: Secondary | ICD-10-CM

## 2022-01-11 DIAGNOSIS — R102 Pelvic and perineal pain: Secondary | ICD-10-CM

## 2022-01-11 LAB — CBC WITH DIFFERENTIAL/PLATELET
Abs Immature Granulocytes: 0.01 10*3/uL (ref 0.00–0.07)
Basophils Absolute: 0 10*3/uL (ref 0.0–0.1)
Basophils Relative: 1 %
Eosinophils Absolute: 0.1 10*3/uL (ref 0.0–0.5)
Eosinophils Relative: 1 %
HCT: 40.9 % (ref 36.0–46.0)
Hemoglobin: 13.6 g/dL (ref 12.0–15.0)
Immature Granulocytes: 0 %
Lymphocytes Relative: 45 %
Lymphs Abs: 2.8 10*3/uL (ref 0.7–4.0)
MCH: 27.8 pg (ref 26.0–34.0)
MCHC: 33.3 g/dL (ref 30.0–36.0)
MCV: 83.6 fL (ref 80.0–100.0)
Monocytes Absolute: 0.7 10*3/uL (ref 0.1–1.0)
Monocytes Relative: 11 %
Neutro Abs: 2.5 10*3/uL (ref 1.7–7.7)
Neutrophils Relative %: 42 %
Platelets: 269 10*3/uL (ref 150–400)
RBC: 4.89 MIL/uL (ref 3.87–5.11)
RDW: 14 % (ref 11.5–15.5)
WBC: 6 10*3/uL (ref 4.0–10.5)
nRBC: 0 % (ref 0.0–0.2)

## 2022-01-11 LAB — WET PREP, GENITAL
Clue Cells Wet Prep HPF POC: NONE SEEN
Sperm: NONE SEEN
Trich, Wet Prep: NONE SEEN
WBC, Wet Prep HPF POC: <10 (ref <10)
Yeast Wet Prep HPF POC: NONE SEEN

## 2022-01-11 LAB — BASIC METABOLIC PANEL
Anion gap: 8 (ref 5–15)
BUN: 12 mg/dL (ref 6–20)
CO2: 25 mmol/L (ref 22–32)
Calcium: 9.2 mg/dL (ref 8.9–10.3)
Chloride: 108 mmol/L (ref 98–111)
Creatinine, Ser: 0.69 mg/dL (ref 0.44–1.00)
GFR, Estimated: >60 mL/min (ref >60)
Glucose, Bld: 103 mg/dL — ABNORMAL HIGH (ref 70–99)
Potassium: 3.3 mmol/L — ABNORMAL LOW (ref 3.5–5.1)
Sodium: 141 mmol/L (ref 135–145)

## 2022-01-11 LAB — URINALYSIS, ROUTINE W REFLEX MICROSCOPIC
Bilirubin Urine: NEGATIVE
Glucose, UA: NEGATIVE mg/dL
Ketones, ur: 5 mg/dL — AB
Nitrite: NEGATIVE
Protein, ur: NEGATIVE mg/dL
Specific Gravity, Urine: 1.013 (ref 1.005–1.030)
pH: 5 (ref 5.0–8.0)

## 2022-01-11 MED ORDER — FENTANYL CITRATE PF 50 MCG/ML IJ SOSY
50.0000 ug | PREFILLED_SYRINGE | Freq: Once | INTRAMUSCULAR | Status: AC
  Administered 2022-01-11: 50 ug via INTRAVENOUS
  Filled 2022-01-11: qty 1

## 2022-01-11 MED ORDER — HYDROCODONE-ACETAMINOPHEN 5-325 MG PO TABS: 1.0000 | ORAL_TABLET | Freq: Four times a day (QID) | ORAL | 0 refills | Status: AC | PRN

## 2022-01-11 MED ORDER — IOHEXOL 300 MG/ML  SOLN
100.0000 mL | Freq: Once | INTRAMUSCULAR | Status: AC | PRN
  Administered 2022-01-11: 100 mL via INTRAVENOUS

## 2022-01-11 MED ORDER — FLUCONAZOLE 150 MG PO TABS: 150.0000 mg | ORAL_TABLET | Freq: Every day | ORAL | 0 refills | Status: AC

## 2022-01-11 MED ORDER — NITROFURANTOIN MONOHYD MACRO 100 MG PO CAPS: 100.0000 mg | ORAL_CAPSULE | Freq: Two times a day (BID) | ORAL | 0 refills | Status: AC

## 2022-01-11 NOTE — ED Provider Notes (Signed)
[facility]-EMERGENCY DEPT Provider Note   CSN: [id number] Arrival date & time: 01/11/22  1236     History  Chief Complaint  Patient presents with   Pelvic Pain    Dawn Atkinson is a 51 y.o. female.  She was seen here couple of days ago for cough and found to be COVID-positive.  Given cough medicine.  She said she has been coughing hard for the past few days.  After she coughed last night she had significant pain in her vagina that radiated up into her right lower quadrant.  She also noticed a little bit of blood when she wiped.  She has some dysuria.  She has a history of interstitial cystitis but she said that this does not feel like that.  She is also had a hysterectomy.  She denies any vaginal discharge.  She continues to have the pain today along with a little bit of bleeding so she wanted to get evaluated.  No nausea or vomiting.  No other bleeding appreciated.  She is not on any blood thinners.  The history is provided by the patient.  Pelvic Pain The current episode started yesterday. The problem occurs constantly. The problem has not changed since onset.Associated symptoms include abdominal pain. Pertinent negatives include no chest pain, no headaches and no shortness of breath. The symptoms are aggravated by coughing. Nothing relieves the symptoms. She has tried rest for the symptoms. The treatment provided no relief.       Home Medications Prior to Admission medications   Medication Sig Start Date End Date Taking? Authorizing Provider  albuterol (PROVENTIL HFA;VENTOLIN HFA) 108 (90 Base) MCG/ACT inhaler Inhale 1-2 puffs into the lungs every 6 (six) hours as needed for wheezing or shortness of breath. 03/25/18   Jeannie Fend, PA-C  albuterol (PROVENTIL) (2.5 MG/3ML) 0.083% nebulizer solution Take 3 mLs (2.5 mg total) by nebulization every 6 (six) hours as needed for wheezing or shortness of breath. 03/07/15   Dowless, Lelon Mast Tripp, PA-C  azelastine  (ASTELIN) 0.1 % nasal spray Place 1 spray into both nostrils daily. Use in each nostril as directed    [provider]  cetirizine (ZYRTEC) 10 MG tablet Take 10 mg by mouth daily. For allergies    [provider]  cholecalciferol (VITAMIN D3) 25 MCG (1000 UNIT) tablet Take 1,000 Units by mouth daily.    [provider]  co-enzyme Q-10 30 MG capsule Take 30 mg by mouth daily.    [provider]  [facility] Liver Oil CAPS Take 1 capsule by mouth daily.    [provider]  docusate sodium (COLACE) 100 MG capsule Take 100 mg by mouth daily as needed for mild constipation.    [provider]  doxycycline (VIBRAMYCIN) 100 MG capsule Take 1 capsule (100 mg total) by mouth 2 (two) times daily. 05/06/21   Tomi Bamberger, PA-C  ezetimibe (ZETIA) 10 MG tablet Take 10 mg by mouth daily. 07/18/20   [provider]  fluconazole (DIFLUCAN) 150 MG tablet Take one tab with initial symptoms and one tab at end of antibiotic treatment. 05/06/21   Tomi Bamberger, PA-C  guaiFENesin-codeine 100-10 MG/5ML syrup Take 5 mLs by mouth 3 (three) times daily as needed for cough. 01/08/22   Lorre Nick, MD  HYDROcodone-acetaminophen (NORCO) 5-325 MG tablet Take 1 tablet by mouth every 6 (six) hours as needed. 05/23/19   Dartha Lodge, PA-C  ibuprofen (ADVIL) 600 MG tablet Take 1 tablet (600  mg total) by mouth every 6 (six) hours as needed. 05/23/19   Dartha Lodge, PA-C  ibuprofen (ADVIL) 800 MG tablet Take 1 tablet (800 mg total) by mouth every 8 (eight) hours as needed. 02/06/20   Vivi Barrack, DPM  losartan (COZAAR) 100 MG tablet Take 100 mg by mouth daily.     [provider]  losartan (COZAAR) 50 MG tablet Take by mouth. 06/26/20   [provider]  naproxen (NAPROSYN) 500 MG tablet Take 1 tablet (500 mg total) by mouth 2 (two) times daily. 09/23/19   Hall-Potvin, [place], PA-C  triamcinolone cream (KENALOG) 0.1 % Apply 1 application topically 2  (two) times daily. 05/06/21   Tomi Bamberger, PA-C  Vitamin D, Ergocalciferol, (DRISDOL) 1.25 MG (50000 UNIT) CAPS capsule Take 1 capsule (50,000 Units total) by mouth every 7 (seven) days. 02/09/20   Vivi Barrack, DPM      Allergies    Amoxicillin-pot clavulanate, Avelox [moxifloxacin hydrochloride], Hydrochlorothiazide, Atorvastatin, Erythromycin, Penicillins, Aspirin, Gabapentin, Lisinopril, Oxycodone, Sulfa antibiotics, and Tessalon [benzonatate]    Review of Systems   Review of Systems  Constitutional:  Positive for fever.  Respiratory:  Positive for cough. Negative for shortness of breath.   Cardiovascular:  Negative for chest pain.  Gastrointestinal:  Positive for abdominal pain. Negative for nausea and vomiting.  Genitourinary:  Positive for dysuria, hematuria, pelvic pain, vaginal bleeding and vaginal pain.  Neurological:  Negative for headaches.    Physical Exam Updated Vital Signs BP 126/85   Pulse 79   Temp 99 F (37.2 C) (Oral)   Resp 16   SpO2 99%  Physical Exam Vitals and nursing note reviewed.  Constitutional:      General: She is not in acute distress.    Appearance: Normal appearance. She is well-developed.  HENT:     Head: Normocephalic and atraumatic.  Eyes:     Conjunctiva/sclera: Conjunctivae normal.  Cardiovascular:     Rate and Rhythm: Normal rate and regular rhythm.     Heart sounds: No murmur heard. Pulmonary:     Effort: Pulmonary effort is normal. No respiratory distress.     Breath sounds: Normal breath sounds.  Abdominal:     Palpations: Abdomen is soft.     Tenderness: There is no abdominal tenderness. There is no guarding or rebound.  Genitourinary:    Comments: Pelvic exam with tech as chaperone.  Normal external genitalia.  Speculum exam with moderate discomfort.  No lesions or active bleeding identified.  No masses appreciated.  GC chlamydia and wet prep sent.  Patient tolerated procedure well. Musculoskeletal:        General:  Normal range of motion.     Cervical back: Neck supple.     Right lower leg: No edema.     Left lower leg: No edema.  Skin:    General: Skin is warm and dry.     Capillary Refill: Capillary refill takes less than 2 seconds.  Neurological:     General: No focal deficit present.     Mental Status: She is alert.     ED Results / Procedures / Treatments   Labs (all labs ordered are listed, but only abnormal results are displayed) Labs Reviewed  URINALYSIS, ROUTINE W REFLEX MICROSCOPIC - Abnormal; Notable for the following components:      Result Value   APPearance HAZY (*)    Hgb urine dipstick MODERATE (*)    Ketones, ur 5 (*)    Leukocytes,Ua LARGE (*)  Bacteria, UA RARE (*)    All other components within normal limits  BASIC METABOLIC PANEL - Abnormal; Notable for the following components:   Potassium 3.3 (*)    Glucose, Bld 103 (*)    All other components within normal limits  WET PREP, GENITAL  URINE CULTURE  CBC WITH DIFFERENTIAL/PLATELET  GC/CHLAMYDIA PROBE AMP ([place]) NOT AT [facility]    EKG None  Radiology CT Abdomen Pelvis W Contrast  Result Date: 01/11/2022 CLINICAL DATA:  Left lower quadrant pain EXAM: CT ABDOMEN AND PELVIS WITH CONTRAST TECHNIQUE: Multidetector CT imaging of the abdomen and pelvis was performed using the standard protocol following bolus administration of intravenous contrast. RADIATION DOSE REDUCTION: This exam was performed according to the departmental dose-optimization program which includes automated exposure control, adjustment of the mA and/or kV according to patient size and/or use of iterative reconstruction technique. CONTRAST:  185mL OMNIPAQUE IOHEXOL 300 MG/ML  SOLN COMPARISON:  03/26/2007 FINDINGS: Lower chest: Bilateral posterior diaphragmatic hernias containing fat. No acute abnormality. Hepatobiliary: No focal hepatic abnormality. Gallbladder unremarkable. Pancreas: No focal abnormality or ductal dilatation. Spleen: No focal  abnormality.  Normal size. Adrenals/Urinary Tract: No focal hepatic abnormality. Gallbladder unremarkable. Stomach/Bowel: Normal appendix. Stomach, large and small bowel grossly unremarkable. Vascular/Lymphatic: No evidence of aneurysm or adenopathy. Scattered aortic calcifications. Reproductive: Prior hysterectomy.  No adnexal masses. Other: No free fluid or free air. Musculoskeletal: No acute bony abnormality. IMPRESSION: No acute findings in the abdomen or pelvis. Electronically Signed   By: Rolm Baptise M.D.   On: 01/11/2022 20:19    Procedures Procedures    Medications Ordered in ED Medications  fentaNYL (SUBLIMAZE) injection 50 mcg (50 mcg Intravenous Given 01/11/22 1905)  iohexol (OMNIPAQUE) 300 MG/ML solution 100 mL (100 mLs Intravenous Contrast Given 01/11/22 1956)    ED Course/ Medical Decision Making/ A&P Clinical Course as of 01/12/22 1047  Sun Jan 11, 2022  2115 Reviewed results of work-up with her.  Will cover with antibiotics for possible UTI.  She is also asking for Diflucan because she gets yeast infections if she takes antibiotics.  Recommended close follow-up PCP.  Return instructions discussed [MB]    Clinical Course User Index [MB] Hayden Rasmussen, MD                           Medical Decision Making Amount and/or Complexity of Data Reviewed Labs: ordered. Radiology: ordered.  Risk Prescription drug management.   This patient complains of pelvic pain after coughing in the setting of recent COVID diagnosis; this involves an extensive number of treatment Options and is a complaint that carries with it a high risk of complications and morbidity. The differential includes muscle strain, bladder prolapse, wound dehiscence, ovarian cyst, perforation  I ordered, reviewed and interpreted labs, which included CBC normal, chemistries normal although mildly low potassium, urinalysis possible signs of infection, wet prep unremarkable I ordered medication IV fluids and pain  medicine and reviewed PMP when indicated. I ordered imaging studies which included CT abdomen and pelvis and I independently    visualized and interpreted imaging which showed no acute findings Previous records obtained and reviewed in epic including recent ED visits Cardiac monitoring reviewed, normal sinus rhythm Social determinants considered, no significant barriers Critical Interventions: None  After the interventions stated above, I reevaluated the patient and found patient's pain to be adequately controlled Admission and further testing considered, no indications for admission or further work-up at this time.  Will cover with pain medicine and antibiotics for possible infection.  Recommended close follow-up with her gynecologist.  Return instructions discussed.         Final Clinical Impression(s) / ED Diagnoses Final diagnoses:  Pelvic pain in female  Lower urinary tract infectious disease    Rx / DC Orders ED Discharge Orders          Ordered    nitrofurantoin, macrocrystal-monohydrate, (MACROBID) 100 MG capsule  2 times daily        01/11/22 2120    fluconazole (DIFLUCAN) 150 MG tablet  Daily        01/11/22 2120    HYDROcodone-acetaminophen (NORCO) 5-325 MG tablet  Every 6 hours PRN        01/11/22 2120              Terrilee Files, MD 01/12/22 1050

## 2022-01-11 NOTE — ED Provider Triage Note (Signed)
Emergency Medicine Provider Triage Evaluation Note  Dawn Atkinson , a 51 y.o. female  was evaluated in triage.  Pt complains of vaginal pain after increased bit of coughing.  Recently diagnosed with COVID.  Was having a coughing fit at home when she felt a "popping sensation" in her lower pelvis.  Felt something "move/come out", did not look as felt scared to look.  Still able to urinate without difficulty.  Having some difficulty with bowel movements.  Hx of hysterectomy.  Denies fever, chills.  Hx of what the patient believes were femoral hernias or inguinal hernias as a child.  Review of Systems  Positive:  Negative: See above  Physical Exam  BP (!) 152/104   Pulse 96   Temp 99.1 F (37.3 C) (Oral)   Resp 18   SpO2 99%  Gen:   Awake, no distress   Resp:  Normal effort  MSK:   Moves extremities without difficulty  Other:  Abdomen soft, nontender  Medical Decision Making  Medically screening exam initiated at 1:15 PM.  Appropriate orders placed.  TYLER ROBIDOUX was informed that the remainder of the evaluation will be completed by another provider, this initial triage assessment does not replace that evaluation, and the importance of remaining in the ED until their evaluation is complete.     Prince Rome, PA-C 02/14/24 1317

## 2022-01-11 NOTE — Discharge Instructions (Signed)
You were seen in the emergency department for pelvic pain after coughing.  You had blood work urinalysis pelvic exam and a CAT scan of your abdomen and pelvis that did not show an obvious explanation for your symptoms.  There was some signs of urinary tract infection we are putting you on antibiotics.  We also prescribing a short course of some pain medicine.  Please contact your primary care doctor for close follow-up.  Return to the emergency department if any worsening or concerning symptoms.

## 2022-01-11 NOTE — ED Triage Notes (Signed)
Pt arrived via POV, c/o pelvic pain after coughing spell. Light vaginal bleeding since. Also c/o pelvic and lower abd pain.

## 2022-01-12 LAB — GC/CHLAMYDIA PROBE AMP (~~LOC~~) NOT AT ARMC
Chlamydia: NEGATIVE
Comment: NEGATIVE
Comment: NORMAL
Neisseria Gonorrhea: NEGATIVE

## 2022-01-12 LAB — URINE CULTURE: Culture: 20000 — AB

## 2022-01-13 ENCOUNTER — Telehealth (HOSPITAL_BASED_OUTPATIENT_CLINIC_OR_DEPARTMENT_OTHER): Payer: Self-pay | Admitting: *Deleted

## 2022-01-13 NOTE — Telephone Encounter (Signed)
Post ED Visit - Positive Culture Follow-up  Culture report reviewed by antimicrobial stewardship pharmacist: [place] Team []  Elenor Quinones, Pharm.D. []  Heide Guile, Pharm.D., BCPS AQ-ID []  Parks Neptune, Pharm.D., BCPS []  Alycia Rossetti, Pharm.D., BCPS []  [place], [place].D., BCPS, AAHIVP []  Legrand Como, Pharm.D., BCPS, AAHIVP []  Salome Arnt, PharmD, BCPS []  Johnnette Gourd, PharmD, BCPS []  Hughes Better, PharmD, BCPS []  Leeroy Cha, PharmD []  Laqueta Linden, PharmD, BCPS []  Albertina Parr, PharmD  [place] Team []  Leodis Sias, PharmD []  Lindell Spar, PharmD []  Royetta Asal, PharmD []  Graylin Shiver, Rph []  Rema Fendt) Glennon Mac, PharmD []  Arlyn Dunning, PharmD []  Netta Cedars, PharmD []  Dia Sitter, PharmD []  Leone Haven, PharmD []  Gretta Arab, PharmD []  Theodis Shove, PharmD []  Peggyann Juba, PharmD []  Reuel Boom, PharmD   Positive urine culture Treated with Nitrofurantoin Monohyd Macro, organism sensitive to the same and no further patient follow-up is required at this time. Dawn Atkinson Dolores Talley 01/13/2022, 12:01 PM

## 2022-09-29 ENCOUNTER — Encounter: Admit: 2022-09-29 | Payer: PRIVATE HEALTH INSURANCE | Attending: Plastic and Reconstructive Surgery

## 2022-09-29 DIAGNOSIS — Z9889 Other specified postprocedural states: Secondary | ICD-10-CM

## 2022-10-09 ENCOUNTER — Encounter: Admit: 2022-10-09 | Payer: PRIVATE HEALTH INSURANCE

## 2022-10-09 ENCOUNTER — Ambulatory Visit: Admit: 2022-10-09 | Payer: MEDICAID | Attending: Plastic Surgery

## 2022-10-09 DIAGNOSIS — C50912 Malignant neoplasm of unspecified site of left female breast: Secondary | ICD-10-CM

## 2022-10-09 DIAGNOSIS — Z9889 Other specified postprocedural states: Secondary | ICD-10-CM

## 2022-10-09 NOTE — Unmapped
Re: Erika Khan ([birth year])Epic MRN: [id number] Provider: Karma Greaser, MDDate of service: 6/21/2024Consultation requested by: Jacques Earthly, MD CONSULTATION REPORT CHIEF COMPLAINT:52 y.o. 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 She underwent a lumpectomy and radiation and was on tamoxifen for 5 years. She was followed for calcifications found on mammogram.  She had an ultrasound and a biopsy which shows cancer. I do not have access to the rest of the details. She was referred urgently here however she has not seen a breast surgeon.She is a bra cup size C and wants to remain the same size. She is interested in autologous reconstruction.She is otherwise healthy with no history of abdominal surgeries. She has a history of thyroidectomy for which she takes Synthroid.PAST MEDICAL HISTORY: No past medical history on file.      PAST SURGICAL HISTORY:No past surgical history on file. ALLERGIES:                                   Not on FileMEDICATIONS:(Not in a hospital admission)I have reviewed the patient's current medication orders.SOCIAL HISTORY:Social History Occupational History  Not on file Tobacco Use  Smoking status: Not on file  Smokeless tobacco: Not on file Substance and Sexual Activity  Alcohol use: Not on file  Drug use: Not on file  Sexual activity: Not on file Social History Social History Narrative  Not on file FAMILY HISTORY:No family history on file.REVIEW OF SYSTEMS:Review of SystemsPertinent items are noted in HPI., All other systems for a 12 point review of systems, were reviewed and were negative.PHYSICAL EXAMINATION:BP (!) 146/80  - Pulse 70  - Temp 97.9 ?F (36.6 ?C) (Temporal)  - Resp 18  - Wt 92.5 kg  - SpO2 99% Pain Score:   0 - No painBreasts grade 2 ptosis bilaterallySSN:N 27 cm right; 28 cm leftN:IMF: 8.5 cm right; 9 cm leftBase width: 15.5 cm right; 16 cm leftAreola diameter: 4 cm bilaterallyAbdominal panniculus moderateSmall umbilical hernia3.5 cm abdominal pinch ASSESSMENT / PLAN: Erika Khan is a 52 y.o. female  with diagnosis of left breast cancer and previous history of radiation therapyWill send an urgent referral for breast oncology/surgeryWe discussed the possible risks related to an autologous based reconstruction including but not limited risk of GA, PE/DVT, infection, hematoma, seroma, donor site morbidity (hernia or bulge), possibly requiring to convert to an MS-TRAM and take muscle, wound dehiscence, fat necrosis, possible revisions in future, aesthetic deformity, partial or total flap failure requiring return to the OR. We also did discuss the risk of TE/implant based reconstruction which would include risk of GA, PE/DVT, infection, hematoma, seroma, aesthetic deformity, possible need for revisions, malposition, extrusion, poor expansion, leak/rupture, capsular contracture, rippling, anaplastic large cell lymphoma and breast implant illness. The patient is interested in autologous based reconstruction and we will set a CTA for her. However, she will need to see our breast surgical oncologist first. Patient was evaluated: In personElectronically Signed: Karma Greaser, MD 10/09/2022 11:11 AM

## 2022-10-12 ENCOUNTER — Encounter: Admit: 2022-10-12 | Payer: PRIVATE HEALTH INSURANCE | Attending: Complex General Surgical Oncology

## 2022-10-12 ENCOUNTER — Inpatient Hospital Stay: Admit: 2022-10-12 | Discharge: 2022-10-12 | Payer: MEDICAID | Primary: Family

## 2022-10-12 ENCOUNTER — Encounter: Admit: 2022-10-12 | Payer: PRIVATE HEALTH INSURANCE

## 2022-10-12 ENCOUNTER — Telehealth: Admit: 2022-10-12 | Payer: PRIVATE HEALTH INSURANCE

## 2022-10-12 DIAGNOSIS — C50919 Malignant neoplasm of unspecified site of unspecified female breast: Secondary | ICD-10-CM

## 2022-10-12 DIAGNOSIS — R928 Other abnormal and inconclusive findings on diagnostic imaging of breast: Secondary | ICD-10-CM

## 2022-10-12 DIAGNOSIS — I10 Essential (primary) hypertension: Secondary | ICD-10-CM

## 2022-10-12 DIAGNOSIS — E039 Hypothyroidism, unspecified: Secondary | ICD-10-CM

## 2022-10-12 MED ORDER — DILTIAZEM CD 120 MG CAPSULE,EXTENDED RELEASE 24 HR
120 mg | Status: AC
Start: 2022-10-12 — End: ?

## 2022-10-12 MED ORDER — LEVOTHYROXINE 137 MCG TABLET
137 MCG | ORAL | Status: AC
Start: 2022-10-12 — End: ?

## 2022-10-13 ENCOUNTER — Inpatient Hospital Stay: Admit: 2022-10-13 | Discharge: 2022-10-13 | Payer: MEDICAID | Primary: Family

## 2022-10-13 ENCOUNTER — Encounter: Admit: 2022-10-13 | Payer: PRIVATE HEALTH INSURANCE

## 2022-10-13 DIAGNOSIS — C73 Malignant neoplasm of thyroid gland: Secondary | ICD-10-CM

## 2022-10-13 DIAGNOSIS — C50912 Malignant neoplasm of unspecified site of left female breast: Secondary | ICD-10-CM

## 2022-10-13 DIAGNOSIS — C50919 Malignant neoplasm of unspecified site of unspecified female breast: Secondary | ICD-10-CM

## 2022-10-13 MED ORDER — SODIUM CHLORIDE 0.9 % LARGE VOLUME SYRINGE FOR AUTOINJECTOR
Freq: Once | INTRAVENOUS | Status: CP | PRN
Start: 2022-10-13 — End: ?
  Administered 2022-10-13: 15:00:00 via INTRAVENOUS

## 2022-10-13 MED ORDER — IOHEXOL 350 MG IODINE/ML INTRAVENOUS SOLUTION
350 mg iodine/mL | Freq: Once | INTRAVENOUS | Status: CP | PRN
Start: 2022-10-13 — End: ?
  Administered 2022-10-13: 15:00:00 350 mL via INTRAVENOUS

## 2022-10-13 NOTE — Unmapped
Telephone call made to Emi Holes prior to surgical and medical oncology consultation to Welcome her to the [facility]. Introduced myself and provided explanation of coordinator services.Reason for Referral: Left breast mastectomy surgery for DCIS Medical Records: [facility] (records requested and pathology and imaging  will be reviewed  here at [facility]) Briefly, Erika Khan is a 52 y.o. woman that lives in [place] with her husband, to [place].   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   She has a PMH of left DCIS in 2015 that was treated with a partial mastectomy by Dr. Elisabeth Most followed by radiation therapy and 5 years of Tamoxifen.  She had recent breast imaging and a stereotactic biopsy that showed left  DCIS ER/PR positive. She was referred to  plastics for mastectomy reconstruction  who referred her to Dr. Luster Landsberg.   Provided detailed explanation of what visits will entail. Reviewed directions, parking information, and visitor policy. Confirmed with patient that there aren't any physical concerns or transportation needs that I could assist with. Patient made aware that she will be contacted by [facility]. ONCOLOGY HISTORY: 07/31/22 Left breast DCIS that is ER/PR positive ONC HISTORY  Oncology History Ductal carcinoma in situ (DCIS) of left breast 09/19/2013 Imaging Significant Findings   Bilateral Screening Mammogram FINDINGS: There are scattered fibroglandular densities (approximately 25-50% glandular). Left breast: Two indeterminate masses are noted in the upper outer quadrant at posterior depth, with the largest mass measuring 2.2 cm. This mass measured up to 1.2 cm in 2013. Additionally, upper-outer middle depth grouped calcifications are indeterminate. Right breast: No suspicious masses, groups of microcalcification or areas of architectural distortion are identified. Overall stable parenchymal pattern with typically benign asymmetries. No lymphadenopathy.  IMPRESSION: 1. Left breast: Two indeterminate masses in the upper outer quadrant at posterior depth. Indeterminate upper outer middle depth grouped calcifications. 2. Right breast: No mammographic evidence of malignancy 3. Scattered fibroglandular densities  BIRADS Category 0 - Incomplete: Needs Additional Imaging Evaluation before a final assessment can be assigned   09/26/2013: Left Diagnostic MammogramFINDINGS: There are scattered fibroglandular densities (approximately 25-50% glandular) The upper outer middle depth grouped calcifications demonstrate a suspicious morphology. Stereotactic biopsy is recommended. There is partial visualization of one of the middle depth masses on these magnification images which corresponds with a 3:00 2.0 cm typically benign simple cyst on concurrent targeted ultrasound. IMPRESSION: 1. Suspicious left breast upper outer grouped middle depth calcifications. 2. 3:00 typically benign simple cyst please see the concurrent ultrasound report for additional details. 3. Scattered fibroglandular densities  BIRADS Category 4 - Suspicious abnormality.  RECOMMENDATION: Stereotactic guided core needle biopsy is recommended and has been scheduled for the suspicious calcifications in the left breast at middle depth.    10/05/2013 *Biopsy   IMPRESSION: Successful stereotactic biopsy of the left breast upper-outer middle depth grouped pleomorphic calcifications  SURGICAL PATHOLOGY REPORT DIAGNOSIS: INTRADUCTAL CARCINOMA Pathology findings are concordant with imaging findings. BIRADS Category 6 - Known Biopsy-Proven Malignancy    10/05/2013 *Pathology Significant Finding   Left BreasT Calcification Biopsy: Ductal Carcinoma In situLargest focus type with lobular extensionNecrosis  ER + 8/8PR + 8/8  10/05/2013 Initial Diagnosis   Ductal carcinoma in situ (DCIS) of left breast  11/06/2013 *Pathology Significant Finding   DIAGNOSIS: Left breast tissue with wire, needle localized partial mastectomy: DUCTAL CARCINOMA IN SITU (DCIS), NUCLEAR GRADE 3- Lymph node sampling: No lymph nodes presentTumor site: Upper outer quadrant; middle depth position  Size (extent) of DCIS:- Estimated size: 7 mm - Architectural pattern(s): Solid-, comedo- and clinging-typesMargins: Uninvolved by DCIS; distance from:- Anterior (closest) margin: 2 mm- Anterosuperior margin: 5 mm- Posterior margin: 9 mm- Remainder margins: Widely clearPathologic (pTNM) stage: pTis pNX pMn/aMicrocalcifications: Present in both DCIS and nonneoplastic tissueAdditional pathologicfindings:Previous biopsy siteNo evidence of invasion Atypical ductal hyperplasia (ADH) and flat epithelial atypia (FEA)Fibrocystic changes (apocrine metaplasia, sclerosing adenosis,mild epithelial hyperplasia without atypia and stromal fibrosis)ER Positive (5 + 3 = 8/8)PR Positive (5 + 3 = 8/8)  11/14/2013 *Genetic Testing/Counseling   Genetic testing was requested based on her recent diagnosis of breast cancer as well as a family history of thyroid cancer at 46. Family history is noted for a maternal aunt with breast cancer at 63. Genetic testing was requested through [facility] for 7 genes; BRCA1, BRCA2, CDH1, PALB2, PTEN, STK11, and TP53.  Analysis was negative for genes analyzed. There were no variants of uncertain significance.  We should note that the testing includes both sequencing and large rearrangement analysis and includes the promoter region of PTEN. The genes were selected because of their associated high risk for breast cancer.    12/19/2013 - 01/31/2014 *Radiation Therapy   RADIATION TREATMENT SUMMARY: AREA TREATED: Left breast Dose (cGy) 5000 cGy. Start: 12/19/2013. End: 01/24/2014. Number of Rx: 25.  RADIATION TREATMENT SUMMARY: AREA TREATED: Left breast boost. Dose (cGy) 1000 cGy. Start: 01/25/2014. End: 01/31/2014. Number of Rx: 5.  RADIATION TREATMENT SUMMARY: AREA TREATED: Total dose left breast. Dose (cGy) 6000 cGy. Start: 12/19/2013. End: 01/31/2014. Number of Rx: 30. Elapsed Days: 43.    02/26/2014 -  *Hormone Therapy   Tamoxifen 20 mg po daily started RECENT IMAGING HISTORY: Mammography Stereotactic Breast Biopsy Vacuum Assisted LeftResult Date: 4/12/2024PROCEDURE: Stereotactic biopsy of the left breast. CLINICAL DATA/INDICATIONS: BI STEREOTACTIC BREAST BIOPSY LEFT 07/31/2022 10:34 AM Patient DOB: 19-May-1970 HISTORY: Patient is scheduled for a stereotactic left breast biopsy for lateral suspicious grouped calcifications at middle depth; these calcifications are located anteriorly to the architectural distortion; sequela prior breast conservation. COMPARISON: Prior breast imaging in 2021, 2023, and 2024 CONSENT AND TIMEOUT: Time-out, which included the patient's full name, date of birth, description of the planned procedure with confirmation of the procedure site, was performed immediately before the procedure to confirm patient's identity and procedure site. Benefits, risks and alternatives to the procedure were discussed. SIGNED INFORMED CONSENT WAS OBTAINED. PROCEDURE: Stereotactic guided biopsy was performed for the lateral suspicious grouped calcifications at middle depth; these calcifications are located anteriorly to the architectural distortion; sequela prior breast conservation. The patient was positioned prone on the stereotactic table, and with her breast in compression, the region of interest was targeted using mammography. A lateral to medial approach was used. The skin was prepped in the usual sterile manner. Cutaneous and deeper subcutaneous anesthesia was achieved using 1% lidocaine. A 3 mm incision was made and a 9 gauge vacuum assisted biopsy needle was inserted into the targeted area and accuracy confirmed mammographically. 5 core biopsy specimens were obtained. Specimen radiograph demonstrated calcifications in the core specimen within compartment A and B. Biopsy marker clip was deployed at the biopsy site. Following the procedure, the biopsy site was compressed and cleaned. Steri-Strips and sterile gauze were applied and the patient was given post-biopsy instructions. Postprocedure mammography was obtained to document biopsy marker clip location. SPECIMEN: The obtained core specimens were sent for pathologic analysis. The patient tolerated the procedure well and left the department in good condition. Mammography Diagnostic Tomo BilateralResult Date: 3/22/2024Exam: Bilateral  digital diagnostic mammography BI DIAGNOSTIC MAMMOGRAM W TOMOSYNTHESIS BILATERAL 07/08/2022 2:47 PM Patient DOB: 04/16/1971 INDICATION: Callback from screening mammography for bilateral indeterminant findings: Calcifications on the left, and focal asymmetry on the right. COMPARISON: Mammography from 06/30/2022, 06/25/2021, 03/11/2020, 02/26/2020, 02/20/2020, 12/14/2017 Technique: Bilateral CC and MLO views with tomosynthesis were obtained. FINDINGS: Density: There are scattered areas of fibroglandular density. Status post lumpectomy in the upper-outer quadrant of the left breast. There is a biopsy clip anterior to the lumpectomy site. Between the biopsy clip and the lumpectomy site there is a 7 mm group of punctate calcifications. Additional linear calcifications are present in the subareolar breast. In the right breast, spot compression views demonstrate a 4 mm well-circumscribed equal density mass in the upper outer quadrant. This corresponds to a simple cyst on same-day ultrasound.Mammography Screening Tomo BilateralResult Date: 3/13/2024Exam: Bilateral digital screening mammography CLINICAL DATA/INDICATIONS: BI SCREENING MAMMOGRAM W TOMOSYNTHESIS BILATERAL 06/30/2022 12:13 PM   Patient DOB: [birth year] Z12.31 Encounter for screening mammogram for malignant neoplasm of breast. HISTORY: Encounter for screening mammogram. No acute palpable abnormality. Left breast conservation for DCIS. Intake mammography questionnaire reviewed. COMPARISON: Mammogram studies available for review on [facility] PACS. TECHNIQUE: Full field synthesized 2D mammography was performed in the CC and MLO projections. Additional right exaggerated CC view. CC and MLO tomosynthesis views were obtained. R2 CAD was used to evaluate this mammogram. FINDINGS: Density: There are scattered areas of fibroglandular density. RIGHT BREAST: There is a 3 mm circumscribed asymmetry within the posterolateral right breast. This may correspond to a circumscribed asymmetry within the superior right breast, middle depth, on the MLO view. This could also correspond to a small stable lymph node in the inferior axillary region on the MLO view. LEFT BREAST: Stable postoperative distortion lateral left breast. There are several round appearing calcifications within the retroareolar left breast. There are several calcifications immediately ventral to the operative scar within the lateral left breast. Mammography Stereotactic Breast Biopsy Vacuum Assisted LeftResult Date: 4/12/2024Successful stereotactic biopsy of lateral suspicious left breast grouped calcifications at middle depth PATHOLOGY: Pending Final report signed by Despina Hidden, MD  Reminder to Patients and Legally Authorized Representatives:  Language in this report is designed for medical communication with other treating physicians and clinical practitioners.  Please speak with your provider(s) about any questions or concerns related to the content of this report. AF^0Mammography Breast Specimen BX in Dept ([facility])Result Date: 4/12/2024Specimen radiograph demonstrates calcifications in the core specimens within compartment A and B Final report signed by Despina Hidden, MD  Reminder to Patients and Legally Authorized Representatives:  Language in this report is designed for medical communication with other treating physicians and clinical practitioners.  Please speak with your provider(s) about any questions or concerns related to the content of this report. AF^0Mammo Guided Clip Placement wo Biopsy Left  ([facility])Result Date: 4/12/2024Postprocedure left breast mammogram was obtained with full-field CC and ML views and CAD to document biopsy marker clip location. Final report signed by Despina Hidden, MD  Reminder to Patients and Legally Authorized Representatives:  Language in this report is designed for medical communication with other treating physicians and clinical practitioners.  Please speak with your provider(s) about any questions or concerns related to the content of this report. AF^0Mammo Additional Views Right GBA ([facility])Result Date: 3/22/2024Simple cyst in the right breast at the 10:00 location correlating with the mammographic finding. No evidence of malignancy. BI-RADS 2-Benign Finding Please see separately dictated mammogram report regarding LEFT breast management recommendations. Final report  signed by Luci Bank, MD  Reminder to Patients and Legally Authorized Representatives:  Language in this report is designed for medical communication with other treating physicians and clinical practitioners.  Please speak with your provider(s) about any questions or concerns related to the content of this report. AF^0Mammography Diagnostic Tomo BilateralResult Date: 3/22/20241. 7 mm group of calcifications anterior to the left lumpectomy site. While these are not highly suspicious, stereotactic biopsy is recommended for further histologic diagnosis. Your patient will be contacted by phone by the [facility] to schedule an appointment for the recommended biopsy. If additional physician orders are required for this visit then the [facility] will contact the ordering physician's office. 2. Small group of calcifications near the nipple in the left breast, likely benign. Six-month follow-up mammogram is recommended. 3. Right breast screening mammography finding correlates to a simple cyst on ultrasound, benign. BI-RADS 4-Suspicious Abnormality- Biopsy should be considered END OF REPORT Final report signed by Luci Bank, MD  Reminder to Patients and Legally Authorized Representatives:  Language in this report is designed for medical communication with other treating physicians and clinical practitioners.  Please speak with your provider(s) about any questions or concerns related to the content of this report. AF^0Mammography Screening Tomo BilateralResult Date: 3/13/20241. Circumscribed asymmetry or mass lateral right breast. 2. Left breast calcifications. 3. Supplemental mammographic views (spot compression CC and MLO right breast and magnification views of the left breast calcifications) recommended. Consider breast ultrasound. BI-RADS 0-Incomplete: Need Additional Imaging Evaluation RECOMMENDATION: Spot compression CC and MLO views right breast magnification views of left breast calcifications. Electronic signature on the final report indicates that Audrea Muscat, MD has personally reviewed this examination initially dictated by Racquel Helsing. Final report signed by Audrea Muscat, MD  Reminder to Patients and Legally Authorized Representatives:  Language in this report is designed for medical communication with other treating physicians and clinical practitioners.  Please speak with your provider(s) about any questions or concerns related to the content of this report. 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 REPRODUCTIVE HISTORY: OB History Gravida Para Term Preterm AB Living 6 1 1  0 5 1 SAB TAB Ectopic Molar Multiple Live Births  0 0 0 0 0 1   # Outcome Date GA Lbr Len/2nd Weight Sex Delivery Anes PTL Lv 6 Term 02/19/87       M Vag-Spont     LIV 5 AB                   4 AB                   3 AB                   2 AB                   1 AB                     Obstetric Comments  GYN Hx:  - Menarche at 30,  FAMILY HISTORY: family history is not on file.SOCIAL HISTORY: Tobacco Use  Smoking status: Not on file  Smokeless tobacco: Not on file Substance Use Topics  Alcohol use: Not on file  Drug use: Not on file  MEDICATIONS: Current Outpatient Medications:   dilTIAZem CD (CARDIZEM CD) 120 mg 24 hr capsule, TAKE 1 CAPSULE (120 MG [name]) BY MOUTH IN THE MORNING, Disp: , Rfl:   levothyroxine (SYNTHROID) 137 MCG tablet, Take 1 tablet (137 mcg total) by mouth., Disp: , Rfl: No current facility-administered medications for this visit.New Patient Packet provided via My Chart, which includes:Welcome Brochure, Directions and Parking, [facility] FMLA Disability Form, [facility] Release of Information (ROI), General Questionnaire (Medical/Surgical Family History), and [facility] New Patient Questionnaire (Health History)Answered all questions in detail. Education provided with teach back. Patient verbalized understanding of all that was discussed. Contact information provided. Noor Witte will contact office with any needs. 45 minutes was spent reviewing/completing Treatment plan review. Intake Specialist's Note:.REFERRAL DETAILS Reason for Referral/Diagnosis: DCIS If second opinion visit, please include previous provider/location: n/a Referring Provider/Self-Referral: HAYKAL, SIBA  Referral Notes: URGENT REFERRAL to whoever if first available  Is health insurance populated in chart? yes APPOINTMENT Visit Type: Consult Breast Surgeon: Burnard Leigh, MD (requested)                                                                             Interpreter Required: no    Medical Records Available in EPIC: yes    Any records available in CareEverywhere? yesPending Records: imaging/path  MyChart Active: yes      Breast Center Welcome Packet Sent (including General + [facility] New Patient Questionnaire): yes Scheduling Code Documented: yes Referral Message Sent to New Patient Pool: yes FAMILY HISTORY/GENETIC TESTING Any family history of breast cancer (if yes, complete below)? yes    How many close blood relatives have been diagnosed? Tommas Olp is the relationship to the patient and age at diagnosis? Grandmother (p); Aunt (m); Self (2015) Any other family history of cancer (if yes, complete below)? no  Have you ever had genetic testing for genes that cause an increased risk for cancer i.e. BRCA1/2 (if yes, complete below)? yes   Were the results positive/negative? negativeWhat year was testing performed and through which provider/institution? [facility]/April or May of 2024 PATHOLOGYHave you ever had a previous breast biopsy or axillary lymph node biopsy (if so, complete below)? yesRight/Left/Bilateral: leftDate Performed: 4/12/2024[facility] [id number] Benign/Malignant: Malignant: DUCTAL CARCINOMA IN SITU (DCIS), NUCLEAR GRADE 3 OF 3-Architectural patterns: [place]- and solid-types-Comedonecrosis: Present; central expansive-Microcalcifications: Present; associated with DCIS as well as non-neoplastic tissueER: Positive (8/8)PR: Positive (6/8)HER2 not available in chart Patient had a previous breast bx 03/11/2020 of Benign breast tissue with stromal fibrosis  Have you had a lumpectomy or a mastectomy (if so, complete below)? yes Right/Left/Bilateral: leftSurgery Type: lumpectomyDate Performed: patient states it was in 2015Surgeon/Hospital: [facility]Reconstruction Date/Surgeon/Hospital (if applicable): no IMAGINGHave you ever had previous breast imaging (if so, complete below)? yesLocation(s) Performed: [facility]NReport Available in EPIC: yes MEDICAL ONCOLOGYHave you received chemotherapy in the past or are you currently undergoing treatments (if yes, complete below)? no    RADIATION ONCOLOGY Have you received radiation therapy in the past or are you currently undergoing treatments (if yes, complete below)? yesType of Treatment: left breast radiationDates Received: 2015 or 2016 (after surgery)Radiation Oncologist: n/a/Center/Hospital: [facility]  COMMENTS: patient requesting visits w/Dr. Luster Landsberg- pls call intake if patient needs med onc .

## 2022-10-14 ENCOUNTER — Inpatient Hospital Stay: Admit: 2022-10-14 | Discharge: 2022-10-14 | Payer: MEDICAID | Primary: Family

## 2022-10-14 ENCOUNTER — Encounter: Admit: 2022-10-14 | Payer: PRIVATE HEALTH INSURANCE | Attending: Complex General Surgical Oncology

## 2022-10-14 ENCOUNTER — Ambulatory Visit: Admit: 2022-10-14 | Payer: MEDICAID | Attending: Complex General Surgical Oncology

## 2022-10-14 DIAGNOSIS — Z01818 Encounter for other preprocedural examination: Secondary | ICD-10-CM

## 2022-10-14 DIAGNOSIS — C73 Malignant neoplasm of thyroid gland: Secondary | ICD-10-CM

## 2022-10-14 DIAGNOSIS — D0512 Intraductal carcinoma in situ of left breast: Secondary | ICD-10-CM

## 2022-10-14 DIAGNOSIS — C50912 Malignant neoplasm of unspecified site of left female breast: Secondary | ICD-10-CM

## 2022-10-14 DIAGNOSIS — C50919 Malignant neoplasm of unspecified site of unspecified female breast: Secondary | ICD-10-CM

## 2022-10-14 NOTE — Unmapped
Surgical consult for left breast DCIS.Dr Luster Landsberg reviewed imaging, pathology and surgical optionsShe has been seen by [facility] and is interested in DIEP.Pre Op teaching provided for left breast mastectomy- Pre Op instructions / medication restrictions  [facility] Arrival & parking- Post op activity restrictions- Drain management including use of drain record-video sent- Management of post op pain- Call if fever = 101, redness, bleeding, worsening pain or concerns.- Eliminate smoking activities 7 days prior to procedure as applicablePatient verbalized / agrees with plan. Confirmed 24 hr contact information.  Labs up to date and in care everywhere/EKG orderedMed/onc referral tbd post-opE-consent signed in patient chartBooking form sent to scheduler 30 minutes  was spent reviewing/completing Pre and post operative teaching, Treatment plan review, and Drain care with Emi Holes. Teach-back method was used to assess understanding.

## 2022-10-14 NOTE — Unmapped
Division of Surgical OncologyNew Patient ConsultationREFERRING PHYSICIAN:Haykal, Darlyn Chamber [facility] ([facility]  [place] [id number] Orvis Brill / [facility] [place] [phone] OF PRESENT Erika Khan is a lovely 52 y.o. female who presents with  newly diagnosed (recurrent) cTisN0 ductal carcinoma in situ, estrogen receptor positive, progesterone receptor positive.  She underwent a bilateral screening mammogram on 06/30/22 which revealed two areas of calcifications in the left breast - one just anterior to the previous lumpectomy cavity measuring approximately 7mm.  She then underwent a left breast diagnostic mammogram on 07/08/22 which revealed the same calcifications in the left breast.  She also has a small area of calcifications near her nipple.  .She then underwent a left breast stereotactic guided biopsy on 07/31/22 with a clip placed.  The clip was deemed to be in appropriate position.  Pathology results demonstrated ductal carcinoma in situ and were deemed concordant with imaging.  She presents today with  alone for surgical consultation.  She has already met with Dr. Cedric Fishman to discuss autologous reconstruction.She denies any other palpable abnormalities bilaterally, no nipple discharge or overlying skin changes.  She tolerated the biopsy well. REPRODUCTIVE HISTORY :Patient's age at first menses was 67.  She is peri-menopausal.  She is a G31P1 and her age of first delivery was 75.  She denies any history of OCP use, denies any fertility or hormonal replacement therapy use.  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  She has never been genetically tested.SOCIAL HISTORY:Patient works as a Naval architect.  She is married and lives at home with her husband.  Patient denies any smoking, alcohol or drug use.MEDICATIONS:Current Outpatient Medications:   dilTIAZem CD, TAKE 1 CAPSULE (120 MG [name]) BY MOUTH IN THE MORNING  levothyroxine, Take 1 tablet (137 mcg total) by mouth.ALLERGIES:Not on FileREVIEW OF SYSTEMS:14 point review of systems was obtained and was negative.PHYSICAL EXAMVitalsVitals:  10/14/22 0803 BP: (!) 143/90 Pulse: 65 Resp: 18 Temp: 97.3 ?F (36.3 ?C) Breast Exam: The patient is an alert oriented female in no acute distress.  Pt examined in the sitting and supine position. The breasts are symmetric. There is no supraclavicular, infraclavicular or cervical adenopathy.  There are no palpable right axillary nodes, and exam of the right breast is normal without evidence of disease. There are no skin or nipple changes.There are no palpable left axillary nodes, and exam of the left breast is normal without evidence of disease. There are radiation changes noted to the left breast skin and a noticeable indent at the 3:00 position. There are no skin or nipple changes.IMAGING:Otero review pendingPATHOLOGY:Home review pendingDCIS - ER+ / PR +Assessment/PlanShawn Khan is a lovely 52 y.o. female who presents with a newly diagnosed (recurrent) cTisN0 left breast ductal carcinoma in situ, ER+/PR+I had a long discussion with the patient and her husband regarding her situation. We began by discussing some of the controversy that exists surrounding the diagnosis of ductal carcinoma in situ. I explained to her in detail what the definition of cancer includes.  I described for  them the difference between DCIS and invasive cancer and the growth pattern of the two.  I emphasized that we feel that there is indeed uncontrolled cell growth in this lesion. The second criterion, that of invasiveness, is not visualized at this time. However, the reason we still recommend treatment is that we know that over time we could certainly see invasiveness in the majority of these lesions. I reviewed with them the options of breast conservation versus mastectomy for DCIS. I explained that breast conservation would entail a wide excision of the area of DCIS plus additional margin beyond it. I reviewed the possibility that clear margins would not be obtained, and I also reviewed the possibility that invasive cancer would be found in the tissue that was removed.  I reviewed the local recurrence rates with breast-conserving surgery versus mastectomy, and the excellent and equivalent overall survival with both approaches. I explained that half of local recurrences after breast-conserving surgery for DCIS are invasive, and those would be treated as invasive cancer. I also explained that if she has a local recurrence after radiation, mastectomy would be indicated.   I estimated the local recurrence rates with the nomogram and demonstrated the benefit of radiation and endocrine therapy in reducing the risk. Given her previous history of radiation therapy - the standard of care would be to perform a mastectomy.  However - if she strongly desired breast conservation - we could consider this however it would increase her risk significantly of a third event.  She desires a mastectomy.  She has already seen Dr. Cedric Fishman to discuss autologous reconstruction.  Will find a surgical date soon for her.She will likely not need radiation therapy or endocrine therapy after a mastectomy for DCIS.We reviewed the potential risks and complications of the procedures discussed, including but not limited to bleeding, infection, lymphedema, and additional surgery in the event that we are unable to clear the margins. After our discussion, the patient appeared to have an excellent understanding of her treatment options and she has elected to undergo left breast mastectomy, left sentinel lymph node biopsy with autologous reconstruction. We will schedule her for surgery ideally either July 30th or August 6th, and the patient was encouraged to call if she had additional questions prior to surgery.  Consent was signed in clinic.I spent over 60 minutes face to face with the patient. Over half of this time I spent counseling the patient with regard to her diagnostic results, my impressions, recommendations, and the risks and benefits of the management options described above.Moody Bruins, MD MSAssistant Professor of [facility], [facility]

## 2022-10-19 ENCOUNTER — Telehealth: Admit: 2022-10-19 | Payer: PRIVATE HEALTH INSURANCE | Attending: Complex General Surgical Oncology

## 2022-10-19 ENCOUNTER — Encounter: Admit: 2022-10-19 | Payer: PRIVATE HEALTH INSURANCE | Attending: Oncology

## 2022-10-19 DIAGNOSIS — C50912 Malignant neoplasm of unspecified site of left female breast: Secondary | ICD-10-CM

## 2022-10-19 DIAGNOSIS — R9389 Abnormal findings on diagnostic imaging of other specified body structures: Secondary | ICD-10-CM

## 2022-10-19 NOTE — Unmapped
Called patient to discuss CTA results - left a voicemail that it would be very unlikely that these are bony metastases but could happen and thus, we recommend a PET scan to further evaluate.  Will schedule for her.

## 2022-10-19 NOTE — Unmapped
Pt's calling to speak with the doctor about Results, please call.

## 2022-10-23 ENCOUNTER — Encounter: Admit: 2022-10-23 | Payer: PRIVATE HEALTH INSURANCE | Attending: Plastic Surgery

## 2022-10-23 ENCOUNTER — Ambulatory Visit: Admit: 2022-10-23 | Payer: MEDICAID | Attending: Plastic Surgery

## 2022-10-23 DIAGNOSIS — C50912 Malignant neoplasm of unspecified site of left female breast: Secondary | ICD-10-CM

## 2022-10-23 DIAGNOSIS — C50919 Malignant neoplasm of unspecified site of unspecified female breast: Secondary | ICD-10-CM

## 2022-10-23 DIAGNOSIS — C73 Malignant neoplasm of thyroid gland: Secondary | ICD-10-CM

## 2022-10-26 ENCOUNTER — Telehealth: Admit: 2022-10-26 | Payer: PRIVATE HEALTH INSURANCE | Attending: Complex General Surgical Oncology

## 2022-10-26 ENCOUNTER — Encounter: Admit: 2022-10-26 | Payer: PRIVATE HEALTH INSURANCE | Attending: Complex General Surgical Oncology

## 2022-10-26 DIAGNOSIS — C50912 Malignant neoplasm of unspecified site of left female breast: Secondary | ICD-10-CM

## 2022-10-26 NOTE — Progress Notes
PLASTIC AND RECONSTRUCTIVE SURGERY PROGRESS NOTEHistory of Present Erika Khan is a 52 y.o. 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 She was seen last in the office 10/09/22 before she had a chance to meet with a breast surgeon (who is now Dr. Luster Landsberg), however at that time patient expressed interest in autologous reconstruction and CTA was ordered. CTA results on 6/25 with incidental findings of advanced sclerotic changes of L3-S1, presumed to be metastatic lesions given the history of breast cancer and further work up with PET scan recommended (now scheduled for 7/17). At this time she is scheduled for L breast mastectomy, L SLNBx and immediate L DIEP reconstruction on 7/30. She wears a C bra and wants to remain the same size. Otherwise healthy w/ no prior hx of abdominal sx. Hx of thyroidectomy for which she takes Synthroid. She denies any significant medical comorbid conditions.Review of Systems10 systems reviewed and found to be negative unless otherwise noted.Past Medical HistoryPast Medical History: Diagnosis Date  Breast cancer (HC Code)   Thyroid cancer (HC Code)   papillary thyroid cancer  Past Surgical HistoryPast Surgical History: Procedure Laterality Date  BREAST SURGERY  2015  Left breat lumpectomy  THYROID SURGERY  2008  Tyroid removed  AllergiesNot on FileHome Medications      Prior to Admission Medications Medication Name Sig Taking? Patient Reported   dilTIAZem CD (CARDIZEM CD) 120 mg 24 hr capsuleLast dose:  --  TAKE 1 CAPSULE (120 MG [name]) BY MOUTH IN THE MORNING   Yes   levothyroxine (SYNTHROID) 137 MCG tabletLast dose:  --  Take 1 tablet (137 mcg total) by mouth.   Yes   Prior to admission medications last reviewed by [name], RN on Wed Oct 14, 2022 6213 Social HistoryN/AFamily History Family History   Family as of 10/23/2022   Problem Relation Name Age of Onset Comments Source  Breast cancer Paternal Grandmother Resa Miner -- Breat cancer- mastectomy Patient  Breast cancer Maternal Aunt Elease Hashimoto Ward -- Breast Cancer Patient  Early death Brother Merlin Ward -- Accidental death/fall Patient    Family Status as of 10/23/2022   Relation Name Status Comments Sex (Gender) Father Mother Source  Paternal Grandmother    Resa Miner -- -- F -- -- Provider  Maternal Aunt    Chelsea Primus -- -- F -- -- Patient  Brother    Merlin Ward -- -- M -- -- Patient   Physical ExamVitals:  10/23/22 1559 BP: 139/88 Pulse: 78 Resp: 16 Temp: 97.6 ?F (36.4 ?C)  Constitutional: Well developed, well nourished, in no acute distressPsychological: normal affect, mood Neuro: A+O x 3, normal gaitHEENT:  normocephalicRespiratory: Normal effort, no respiratory distressCardiovascular: visible extremities are warm and well perfused Chest/Breasts: Grade 2 ptosis bilaterally					Right			LeftNipple to Sternal Notch=  		27			28Base Width=				15.5			16Nipple to IMF= 			             8.5	                          9Areola diameter=			4			4Abdomen: moderate abdominal pannus, small umbilical herniaAbdominal pinch: 3.5 cmReview of Diagnostics Pathology  ImagingNo imaging found for the last ~ 1 month. Impression/RecommendationsShawn Khan is a 52 y.o. female with recurrent L breast DCIS here to discuss breast reconstruction again. We first discussed that breast reconstruction is a choice. It is not a requirement and as a result the first  option for breast reconstruction is to do nothing and not have reconstruction. She understands this. I discussed that most women do, however, benefit from a psychosocial standpoint with regard to breast reconstruction and the data has strongly supported this point. We discussed that in an implant based breast reconstruction in the delayed setting, a tissue expander is placed above the pectoralis muscle, depending on the mastectomy skin blood flow, and acellular dermal matrix may be used. This procedure requires an overnight stay. At the two week post-operative visit, we begin the process of expansion. Expansion typically takes weeks to months to achieve the final size. We then wait for healing and stabilization of the expansion and then do a final exchange to a permanent implant as an outpatient procedure in 3-6 months, depending on the need for further cancer treatment. We discussed the pros and cons of this type of reconstruction in that implants typically do not have an infinite lifespan and require monitoring, as well as possibly procedures to have replacement down the road. We discussed the risks and benefits of implant based breast reconstruction including but not limited to infection, bleeding, wound healing problems, implant loss, need for revision, rippling, capsular contracture, asymmetry, scarring, and pain. We did discuss BIA-ALCL and the off-label use of mesh.Next, we discussed autologous-based breast reconstruction. We discussed that in this form of breast reconstruction, the patient's own tissue is utilized for reconstruction of the breast. The gold standard tissue is abdominal tissue, though other sites such as the thigh are also at times used. We utilize pre-operative imaging to facilitate with surgical planning, either a CTA or an MRA. The tissue is dissected out based on the blood vessels supplying the skin and fat, and the vessels are traced to their origin. In the abdomen, these vessels run through the rectus muscle. We discussed that in most cases, we are able to spare the muscle but at times we need to take a small strip of muscle to ensure good blood supply. Even in this situation, the data demonstrates that patients do well in terms of post-operative abdominal function and recovery. Once the flap is dissected, it is removed from the body and transferred to the chest where the vessels are connected. To facilitate recipient vessel exposure, a small portion of rib is often resected, The flap is formed to a reconstructed breast mound which ages with the patient in terms of breast ptosis. We discussed that this procedure takes 6-12 hours and has a hospitalization of 3 days and has a recovery of approximately 6 weeks, mainly for the abdominal donor site. We discussed the risks and benefits of the procedure. I quoted her an approximately 97-99% success rate given data at our institution. We discussed the risks of specific complications which include but are not limited to bleeding, infection, seroma, or wound healing problems either at the breast or abdominal site, scarring, asymmetry, and pain. We had a lengthy discussion regarding possible complete or partial flap loss and the implications that this would have regarding the need for further surgery and the resultant asymmetry. We discussed the risk of abdominal bulge and/or frank hernia and the fact that such problems are frequently difficult to correct. We discussed with her that we monitor very closely post-operatively and if something occurs that is significant, we would take her back to the operating room to revise and fix the problem. We then discussed nerve grafting - we are still undergoing trials to evaluate the true efficacy of nerve-sparing/re-sensation techniques. We often use nerve  allograft to reconnect the cut end of the chest nerve to either the nipple or the flap recipient sensory nerve. While there is emerging evidence to support some return of sensation, we discussed this will never be 100% of their previous sensation.After a long discussion, she has decided to proceed with left breast autologous reconstruction using abdominal based free flaps and well as symmetrizing right breast mastopexy/reduction for which she is an excellent candidate. She was given ample time to ask any questions. Clinical photos and measurements were taken, consent signed. We will await results of the PET scan in case of any changes to surgical plans. Patient exam or treatment required medical chaperone.The sensitive parts of the examination were performed with chaperone present: Yes (K.Bach)Plastic and Reconstructive Surgery Consult Contact InformationYork [address] [facility] 24/7: [phone] [facility] [id number] [phone]- 5p): [phone], [phone] (5p-7a) and weekends: [phone] new facial and hand trauma consults, please check Amion to determine the appropriate team on call for the week.

## 2022-10-26 NOTE — Progress Notes
PLASTIC AND RECONSTRUCTIVE SURGERY PROGRESS NOTEHistory of Present Erika Khan is a 52 y.o. 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 She was seen last in the office 10/09/22 before she had a chance to meet with a breast surgeon (who is now Dr. Luster Khan), however at that time patient expressed interest in autologous reconstruction and CTA was ordered. CTA results on 6/25 with incidental findings of advanced sclerotic changes of L3-S1, presumed to be metastatic lesions given the history of breast cancer and further work up with PET scan recommended (now scheduled for 7/17). At this time she is scheduled for L breast mastectomy, L SLNBx and immediate L DIEP reconstruction on 7/30. She wears a C bra and wants to remain the same size. Otherwise healthy w/ no prior hx of abdominal sx. Hx of thyroidectomy for which she takes Synthroid. She denies any significant medical comorbid conditions.Review of Systems10 systems reviewed and found to be negative unless otherwise noted.Past Medical HistoryPast Medical History: Diagnosis Date  Breast cancer (HC Code)   Thyroid cancer (HC Code)   papillary thyroid cancer  Past Surgical HistoryPast Surgical History: Procedure Laterality Date  BREAST SURGERY  2015  Left breat lumpectomy  THYROID SURGERY  2008  Tyroid removed  AllergiesNot on FileHome Medications      Prior to Admission Medications Medication Name Sig Taking? Patient Reported   dilTIAZem CD (CARDIZEM CD) 120 mg 24 hr capsuleLast dose:  --  TAKE 1 CAPSULE (120 MG [name]) BY MOUTH IN THE MORNING   Yes   levothyroxine (SYNTHROID) 137 MCG tabletLast dose:  --  Take 1 tablet (137 mcg total) by mouth.   Yes   Prior to admission medications last reviewed by Erika Lore, RN on Wed Oct 14, 2022 1610 Social HistoryN/AFamily History Family History   Family as of 10/23/2022   Problem Relation Name Age of Onset Comments Source  Breast cancer Paternal Grandmother Erika Khan -- Breat cancer- mastectomy Patient  Breast cancer Maternal Aunt Erika Khan -- Breast Cancer Patient  Early death Brother Erika Khan -- Accidental death/fall Patient    Family Status as of 10/23/2022   Relation Name Status Comments Sex (Gender) Father Mother Source  Paternal Grandmother    Erika Khan -- -- F -- -- Provider  Maternal Aunt    Erika Khan -- -- F -- -- Patient  Brother    Erika Khan -- -- M -- -- Patient   Physical ExamVitals:  10/23/22 1559 BP: 139/88 Pulse: 78 Resp: 16 Temp: 97.6 ?F (36.4 ?C)  Constitutional: Well developed, well nourished, in no acute distressPsychological: normal affect, mood Neuro: A+O x 3, normal gaitHEENT:  normocephalicRespiratory: Normal effort, no respiratory distressCardiovascular: visible extremities are warm and well perfused Chest/Breasts: Grade 2 ptosis bilaterally					Right			LeftNipple to Sternal Notch=  		27			28Base Width=				15.5			16Nipple to IMF= 			             8.5	                          9Areola diameter=			4			4Abdomen: moderate abdominal pannus, small umbilical herniaAbdominal pinch: 3.5 cmReview of Diagnostics Pathology  ImagingNo imaging found for the last ~ 1 month. Impression/RecommendationsShawn Khan is a 52 y.o. female with recurrent L breast DCIS here to discuss breast reconstruction again. We first discussed that breast reconstruction is a choice. It is not a requirement and as a result the first  option for breast reconstruction is to do nothing and not have reconstruction. She understands this. I discussed that most women do, however, benefit from a psychosocial standpoint with regard to breast reconstruction and the data has strongly supported this point. We discussed that in an implant based breast reconstruction in the delayed setting, a tissue expander is placed above the pectoralis muscle, depending on the mastectomy skin blood flow, and acellular dermal matrix may be used. This procedure requires an overnight stay. At the two week post-operative visit, we begin the process of expansion. Expansion typically takes weeks to months to achieve the final size. We then wait for healing and stabilization of the expansion and then do a final exchange to a permanent implant as an outpatient procedure in 3-6 months, depending on the need for further cancer treatment. We discussed the pros and cons of this type of reconstruction in that implants typically do not have an infinite lifespan and require monitoring, as well as possibly procedures to have replacement down the road. We discussed the risks and benefits of implant based breast reconstruction including but not limited to infection, bleeding, wound healing problems, implant loss, need for revision, rippling, capsular contracture, asymmetry, scarring, and pain. We did discuss BIA-ALCL and the off-label use of mesh.Next, we discussed autologous-based breast reconstruction. We discussed that in this form of breast reconstruction, the patient's own tissue is utilized for reconstruction of the breast. The gold standard tissue is abdominal tissue, though other sites such as the thigh are also at times used. We utilize pre-operative imaging to facilitate with surgical planning, either a CTA or an MRA. The tissue is dissected out based on the blood vessels supplying the skin and fat, and the vessels are traced to their origin. In the abdomen, these vessels run through the rectus muscle. We discussed that in most cases, we are able to spare the muscle but at times we need to take a small strip of muscle to ensure good blood supply. Even in this situation, the data demonstrates that patients do well in terms of post-operative abdominal function and recovery. Once the flap is dissected, it is removed from the body and transferred to the chest where the vessels are connected. To facilitate recipient vessel exposure, a small portion of rib is often resected, The flap is formed to a reconstructed breast mound which ages with the patient in terms of breast ptosis. We discussed that this procedure takes 6-12 hours and has a hospitalization of 3 days and has a recovery of approximately 6 weeks, mainly for the abdominal donor site. We discussed the risks and benefits of the procedure. I quoted her an approximately 97-99% success rate given data at our institution. We discussed the risks of specific complications which include but are not limited to bleeding, infection, seroma, or wound healing problems either at the breast or abdominal site, scarring, asymmetry, and pain. We had a lengthy discussion regarding possible complete or partial flap loss and the implications that this would have regarding the need for further surgery and the resultant asymmetry. We discussed the risk of abdominal bulge and/or frank hernia and the fact that such problems are frequently difficult to correct. We discussed with her that we monitor very closely post-operatively and if something occurs that is significant, we would take her back to the operating room to revise and fix the problem. We then discussed nerve grafting - we are still undergoing trials to evaluate the true efficacy of nerve-sparing/re-sensation techniques. We often use nerve  allograft to reconnect the cut end of the chest nerve to either the nipple or the flap recipient sensory nerve. While there is emerging evidence to support some return of sensation, we discussed this will never be 100% of their previous sensation.After a long discussion, she has decided to proceed with left breast autologous reconstruction using abdominal based free flaps and well as symmetrizing right breast mastopexy/reduction for which she is an excellent candidate. She was given ample time to ask any questions. Clinical photos and measurements were taken, consent signed. We will await results of the PET scan in case of any changes to surgical plans. Patient exam or treatment required medical chaperone.The sensitive parts of the examination were performed with chaperone present: YesPlastic and Reconstructive Surgery Consult Contact [facility] 24/7: [phone] [facility] BMWUXLKG [phone]): [phone], [phone] (5p-7a) and weekends: [phone] new facial and hand trauma consults, please check Amion to determine the appropriate team on call for the week.

## 2022-10-26 NOTE — Telephone Encounter
Spoke to patient advised of surgery date and location will call back with arrival time

## 2022-11-04 ENCOUNTER — Inpatient Hospital Stay: Admit: 2022-11-04 | Discharge: 2022-11-04 | Payer: MEDICAID | Primary: Family

## 2022-11-04 DIAGNOSIS — R9389 Abnormal findings on diagnostic imaging of other specified body structures: Secondary | ICD-10-CM

## 2022-11-04 DIAGNOSIS — C50912 Malignant neoplasm of unspecified site of left female breast: Secondary | ICD-10-CM

## 2022-11-04 MED ORDER — FLUDEOXYGLUCOSE P/DOSE (FDG) INJECTION
Freq: Once | INTRAVENOUS | Status: CP | PRN
Start: 2022-11-04 — End: ?
  Administered 2022-11-04: 21:00:00 via INTRAVENOUS

## 2022-11-04 MED ORDER — IOHEXOL 350 MG IODINE/ML ORAL
350 | Freq: Once | ORAL | Status: CP
Start: 2022-11-04 — End: ?
  Administered 2022-11-04: 21:00:00 350 mL via ORAL

## 2022-11-05 ENCOUNTER — Ambulatory Visit: Admit: 2022-11-05 | Payer: MEDICAID

## 2022-11-05 ENCOUNTER — Telehealth: Admit: 2022-11-05 | Payer: PRIVATE HEALTH INSURANCE | Attending: Oncology

## 2022-11-05 NOTE — Telephone Encounter
Placed call to Erika Khan to discuss the results of her recent imaging.  Mailbox was full, unable to leave vm

## 2022-11-10 ENCOUNTER — Ambulatory Visit: Admit: 2022-11-10 | Payer: MEDICAID | Primary: Family

## 2022-11-11 ENCOUNTER — Encounter: Admit: 2022-11-11 | Payer: PRIVATE HEALTH INSURANCE | Attending: Complex General Surgical Oncology | Primary: Family

## 2022-11-11 DIAGNOSIS — T753XXA Motion sickness, initial encounter: Secondary | ICD-10-CM

## 2022-11-11 DIAGNOSIS — C50919 Malignant neoplasm of unspecified site of unspecified female breast: Secondary | ICD-10-CM

## 2022-11-11 DIAGNOSIS — C73 Malignant neoplasm of thyroid gland: Secondary | ICD-10-CM

## 2022-11-15 ENCOUNTER — Encounter: Admit: 2022-11-15 | Payer: PRIVATE HEALTH INSURANCE | Attending: Complex General Surgical Oncology | Primary: Family

## 2022-11-15 DIAGNOSIS — I10 Essential (primary) hypertension: Secondary | ICD-10-CM

## 2022-11-16 ENCOUNTER — Telehealth: Admit: 2022-11-16 | Payer: PRIVATE HEALTH INSURANCE | Attending: Plastic Surgery | Primary: Family

## 2022-11-16 ENCOUNTER — Encounter: Admit: 2022-11-16 | Payer: MEDICAID | Attending: Student in an Organized Health Care Education/Training Program

## 2022-11-16 ENCOUNTER — Encounter: Admit: 2022-11-16 | Payer: PRIVATE HEALTH INSURANCE | Attending: Anesthesiology | Primary: Family

## 2022-11-16 NOTE — Telephone Encounter
Tried to return call. No answer and no vm. I don't see a duplicate order in epic

## 2022-11-16 NOTE — Telephone Encounter
Copied from CRM #[id number]. Topic: General Message - [facility]>> Nov 16, 2022 10:34 AM Willaim Sheng wrote:[facility] MESSAGETime of call:   10:34 AMCaller:   [facility] SaraCaller's relationship to patient:  n/a  Calling from [employer], hospital, agency, etc.):  n/a   Reason for call:   [place] Scan was done on 6/25 wants to know if appt can be cancelled.  Believes it maybe a duplicate order.If not feeling well, what are symptoms:  n/a   If having symptoms, how long have the symptoms been present:  n/a   Does caller request to speak to someone urgently?  yes   Best telephone number for callback:   [phone] Best time to return call:   as soon as possiblePermission to leave message:  yes   [facility]

## 2022-11-17 ENCOUNTER — Telehealth: Admit: 2022-11-17 | Payer: PRIVATE HEALTH INSURANCE | Primary: Family

## 2022-11-17 ENCOUNTER — Inpatient Hospital Stay
Admit: 2022-11-17 | Discharge: 2022-11-20 | Payer: PRIVATE HEALTH INSURANCE | Attending: Plastic Surgery | Admitting: Plastic Surgery

## 2022-11-17 ENCOUNTER — Inpatient Hospital Stay: Admit: 2022-11-17 | Discharge: 2022-11-17 | Payer: MEDICAID | Primary: Family

## 2022-11-17 ENCOUNTER — Inpatient Hospital Stay: Admit: 2022-11-17 | Payer: MEDICAID | Attending: Student in an Organized Health Care Education/Training Program

## 2022-11-17 DIAGNOSIS — C50912 Malignant neoplasm of unspecified site of left female breast: Secondary | ICD-10-CM

## 2022-11-17 MED ORDER — BUPIVACAINE (PF) 0.25 % (2.5 MG/ML) INJECTION SOLUTION
0.25 | Status: CP
Start: 2022-11-17 — End: ?

## 2022-11-17 MED ORDER — CHLORHEXIDINE GLUCONATE 0.12 % MOUTHWASH
0.12 | Freq: Once | OROMUCOSAL | Status: CP
Start: 2022-11-17 — End: ?
  Administered 2022-11-17: 11:00:00 0.12 mL via OROMUCOSAL

## 2022-11-17 MED ORDER — LIDOCAINE (PF) 20 MG/ML (2 %) INJECTION SOLUTION
20 mg/mL (2 %) | INTRAVENOUS | Status: DC
  Administered 2022-11-17: 12:00:00 20 mg/mL (2 %) via INTRAVENOUS

## 2022-11-17 MED ORDER — SODIUM CHLORIDE 0.9 % IRRIGATION SOLUTION
0.9 | Status: CP | PRN
Start: 2022-11-17 — End: ?
  Administered 2022-11-17 (×2): 0.9 % irrigation

## 2022-11-17 MED ORDER — DEXMEDETOMIDINE INFUSION FOR ANESTHESIA
INTRAVENOUS | Status: DC
  Administered 2022-11-17: 12:00:00 250.000 mL/h via INTRAVENOUS

## 2022-11-17 MED ORDER — PROPOFOL 10 MG/ML INTRAVENOUS EMULSION
10 mg/mL | INTRAVENOUS | Status: DC
  Administered 2022-11-17: 12:00:00 10 mg/mL via INTRAVENOUS

## 2022-11-17 MED ORDER — CEFAZOLIN 1 GRAM SOLUTION FOR INJECTION
1 gram | INTRAVENOUS | Status: DC
  Administered 2022-11-17 (×2): 1 gram via INTRAVENOUS

## 2022-11-17 MED ORDER — DEXAMETHASONE SODIUM PHOSPHATE (PF) 10 MG/ML INJECTION SOLUTION
10 | Status: DC | PRN
Start: 2022-11-17 — End: 2022-11-17
  Administered 2022-11-17: 13:00:00 10 mg/mL

## 2022-11-17 MED ORDER — ONDANSETRON HCL (PF) 4 MG/2 ML INJECTION SOLUTION: 4 mg/2 mL | INTRAVENOUS | Status: DC

## 2022-11-17 MED ORDER — MAGNESIUM SULFATE 500 MG/ML (50 %) INJECTION SOLUTION
500 | Status: CP
Start: 2022-11-17 — End: ?

## 2022-11-17 MED ORDER — ENOXAPARIN 40 MG/0.4 ML SUBCUTANEOUS SYRINGE
40 | Freq: Once | SUBCUTANEOUS | Status: CP
Start: 2022-11-17 — End: ?
  Administered 2022-11-17: 11:00:00 40 mg/0.4 mL via SUBCUTANEOUS

## 2022-11-17 MED ORDER — SUGAMMADEX 100 MG/ML INTRAVENOUS SOLUTION
100 | Status: CP
Start: 2022-11-17 — End: ?

## 2022-11-17 MED ORDER — LACTATED RINGERS INTRAVENOUS SOLUTION
INTRAVENOUS | Status: DC
  Administered 2022-11-17: 22:00:00 1000.000 mL/h via INTRAVENOUS

## 2022-11-17 MED ORDER — FENTANYL (PF) 50 MCG/ML INJECTION SOLUTION
50 | Status: CP
Start: 2022-11-17 — End: ?

## 2022-11-17 MED ORDER — PROPOFOL 10 MG/ML INTRAVENOUS EMULSION
10 mg/mL | Status: DC
  Administered 2022-11-17: 12:00:00 10 mL/h

## 2022-11-17 MED ORDER — METHYLPREDNISOLONE ACETATE 40 MG/ML SUSPENSION FOR INJECTION
40 | Status: DC | PRN
Start: 2022-11-17 — End: 2022-11-17
  Administered 2022-11-17: 13:00:00 40 mg/mL

## 2022-11-17 MED ORDER — DEXAMETHASONE SODIUM PHOSPHATE (PF) 10 MG/ML INJECTION SOLUTION
10 | Status: CP
Start: 2022-11-17 — End: ?

## 2022-11-17 MED ORDER — SODIUM CHLORIDE 0.9 % (FLUSH) INJECTION SYRINGE
0.9 % | INTRAVENOUS | Status: DC | PRN
Start: 2022-11-17 — End: 2022-11-17

## 2022-11-17 MED ORDER — MIDAZOLAM 1 MG/ML INJECTION SOLUTION
1 mg/mL | INTRAVENOUS | Status: DC
  Administered 2022-11-17: 12:00:00 1 mg/mL via INTRAVENOUS

## 2022-11-17 MED ORDER — CHLORHEXIDINE GLUCONATE 2 % TOWELETTE
2 % | Freq: Once | TOPICAL | Status: DC
Start: 2022-11-17 — End: 2022-11-17

## 2022-11-17 MED ORDER — FENTANYL (PF) 50 MCG/ML INJECTION SOLUTION
50 mcg/mL | INTRAVENOUS | Status: DC | PRN
Start: 2022-11-17 — End: 2022-11-17

## 2022-11-17 MED ORDER — MAGNESIUM SULFATE 500 MG/ML (50 %) INJECTION SOLUTION
500 | Status: DC | PRN
Start: 2022-11-17 — End: 2022-11-17
  Administered 2022-11-17: 15:00:00 500 mg/mL (50 %) via TOPICAL

## 2022-11-17 MED ORDER — METHYLPREDNISOLONE ACETATE 40 MG/ML SUSPENSION FOR INJECTION
40 | Status: CP
Start: 2022-11-17 — End: ?

## 2022-11-17 MED ORDER — SODIUM CHLORIDE 0.9 % (FLUSH) INJECTION SYRINGE
0.9 % | Freq: Three times a day (TID) | INTRAVENOUS | Status: DC
Start: 2022-11-17 — End: 2022-11-17

## 2022-11-17 MED ORDER — OXYCODONE (ROXICODONE) IMMEDIATE RELEASE 2.5 MG HALFTAB
2.5 mg | ORAL | Status: DC | PRN
Start: 2022-11-17 — End: 2022-11-17

## 2022-11-17 MED ORDER — POLYETHYLENE GLYCOL 3350 17 GRAM ORAL POWDER PACKET
17 gram | Freq: Every day | ORAL | Status: DC
Start: 2022-11-17 — End: 2022-11-20
  Administered 2022-11-18 – 2022-11-20 (×3): 17 gram via ORAL

## 2022-11-17 MED ORDER — ONDANSETRON HCL (PF) 4 MG/2 ML INJECTION SOLUTION
4 mg/2 mL | INTRAVENOUS | Status: DC
  Administered 2022-11-17: 18:00:00 4 mg/2 mL via INTRAVENOUS

## 2022-11-17 MED ORDER — MORPHINE 2 MG/ML INJECTION SYRINGE
2 mg/mL | Freq: Once | INTRAVENOUS | Status: CP
Start: 2022-11-17 — End: ?
  Administered 2022-11-18: 01:00:00 2 mL via INTRAVENOUS

## 2022-11-17 MED ORDER — DEXAMETHASONE SODIUM PHOSPHATE (PF) 10 MG/ML INJECTION SOLUTION
10 mg/mL | INTRAVENOUS | Status: DC
  Administered 2022-11-17: 12:00:00 10 mg/mL via INTRAVENOUS

## 2022-11-17 MED ORDER — CEFAZOLIN IV PUSH 1 GRAM VIAL & 0.9% SODIUM CHLORIDE (ADULT)
Freq: Three times a day (TID) | INTRAVENOUS | Status: DC
Start: 2022-11-17 — End: 2022-11-20
  Administered 2022-11-18 – 2022-11-20 (×8): 10.000 mL via INTRAVENOUS

## 2022-11-17 MED ORDER — ACETAMINOPHEN 500 MG TABLET
500 | Freq: Once | ORAL | Status: CP
Start: 2022-11-17 — End: ?
  Administered 2022-11-17: 11:00:00 500 mg via ORAL

## 2022-11-17 MED ORDER — HYDROMORPHONE 0.5 MG/0.5 ML INJECTION SYRINGE
0.50.5 mg/ mL | INTRAVENOUS | Status: DC | PRN
Start: 2022-11-17 — End: 2022-11-17

## 2022-11-17 MED ORDER — ENOXAPARIN 40 MG/0.4 ML SUBCUTANEOUS SYRINGE
40 mg/0.4 mL | SUBCUTANEOUS | Status: DC
  Administered 2022-11-18 – 2022-11-20 (×3): 40 mg/0.4 mL via SUBCUTANEOUS

## 2022-11-17 MED ORDER — DILTIAZEM CD 120 MG CAPSULE,EXTENDED RELEASE 24 HR
120 | Freq: Every day | ORAL | Status: DC
Start: 2022-11-17 — End: 2022-11-20

## 2022-11-17 MED ORDER — ROCURONIUM 10 MG/ML INTRAVENOUS SOLUTION
10 mg/mL | INTRAVENOUS | Status: DC
  Administered 2022-11-17 (×7): 10 mg/mL via INTRAVENOUS

## 2022-11-17 MED ORDER — POLYETHYLENE GLYCOL 3350 17 GRAM ORAL POWDER PACKET: 17 gram | ORAL | Status: DC

## 2022-11-17 MED ORDER — LIDOCAINE 4 % TOPICAL PATCH
4 | TRANSDERMAL | Status: DC
Start: 2022-11-17 — End: 2022-11-20

## 2022-11-17 MED ORDER — FENTANYL (PF) 50 MCG/ML INJECTION SOLUTION
50 mcg/mL | INTRAVENOUS | Status: DC
  Administered 2022-11-17 (×3): 50 mcg/mL via INTRAVENOUS

## 2022-11-17 MED ORDER — HEPARIN (PORCINE) 5,000 UNIT/ML INJECTION SOLUTION
5000 | Status: CP
Start: 2022-11-17 — End: ?

## 2022-11-17 MED ORDER — LIDOCAINE 1 %-EPINEPHRINE 1:100,000 INJECTION SOLUTION
1 | Status: CP
Start: 2022-11-17 — End: ?

## 2022-11-17 MED ORDER — ZZ IMS TEMPLATE
Freq: Every day | ORAL | Status: DC
Start: 2022-11-17 — End: 2022-11-20
  Administered 2022-11-18 – 2022-11-19 (×2): 137 MCG via ORAL

## 2022-11-17 MED ORDER — NALOXONE 0.4 MG/ML INJECTION SOLUTION
0.4 mg/mL | INTRAVENOUS | Status: DC | PRN
Start: 2022-11-17 — End: 2022-11-17

## 2022-11-17 MED ORDER — TECHNETIUM TC-99M TILMANOCEPT (LYMPHOSEEK) INJECTION
Freq: Once | INTRAVENOUS | Status: CP | PRN
Start: 2022-11-17 — End: ?
  Administered 2022-11-17: 12:00:00 .500 mL via INTRAVENOUS

## 2022-11-17 MED ORDER — LACTATED RINGERS INTRAVENOUS SOLUTION
INTRAVENOUS | Status: DC
  Administered 2022-11-17: 12:00:00 via INTRAVENOUS

## 2022-11-17 MED ORDER — SUGAMMADEX 100 MG/ML INTRAVENOUS SOLUTION
100 mg/mL | INTRAVENOUS | Status: DC
  Administered 2022-11-17: 18:00:00 100 mg/mL via INTRAVENOUS

## 2022-11-17 MED ORDER — METHYLENE BLUE (ANTIDOTE) 5 MG/ML INTRAVENOUS SOLUTION
5 | Status: CP
Start: 2022-11-17 — End: ?

## 2022-11-17 MED ORDER — CELECOXIB 200 MG CAPSULE
200 | Freq: Once | ORAL | Status: CP
Start: 2022-11-17 — End: ?
  Administered 2022-11-17: 11:00:00 200 mg via ORAL

## 2022-11-17 MED ORDER — CHLORHEXIDINE GLUCONATE 0.12 % MOUTHWASH
0.12 % | Freq: Once | OROMUCOSAL | Status: DC
Start: 2022-11-17 — End: 2022-11-17

## 2022-11-17 MED ORDER — MIDAZOLAM 1 MG/ML INJECTION SOLUTION
1 | Status: CP
Start: 2022-11-17 — End: ?

## 2022-11-17 MED ORDER — BUPIVACAINE (PF) 0.25 % (2.5 MG/ML) INJECTION SOLUTION
0.25 | Status: DC | PRN
Start: 2022-11-17 — End: 2022-11-17
  Administered 2022-11-17: 13:00:00 0.25 % (2.5 mg/mL)

## 2022-11-17 MED ORDER — ONDANSETRON 4 MG DISINTEGRATING TABLET: 4 mg | ORAL | Status: DC

## 2022-11-17 MED ORDER — HYDROMORPHONE 2 MG/ML INJECTION SOLUTION
2 mg/mL | INTRAVENOUS | Status: DC
  Administered 2022-11-17 (×2): 2 mg/mL via INTRAVENOUS

## 2022-11-17 MED ORDER — ZZ IMS TEMPLATE
ORAL | Status: DC
  Administered 2022-11-17 – 2022-11-20 (×6): 25 mg via ORAL

## 2022-11-17 MED ORDER — ONDANSETRON HCL (PF) 4 MG/2 ML INJECTION SOLUTION
42 mg/2 mL | INTRAVENOUS | Status: DC | PRN
Start: 2022-11-17 — End: 2022-11-17

## 2022-11-17 MED ORDER — HEPARIN 5000 UNITS IN SODIUM CHLORIDE 0.9% 500ML (MIXTURE)
Status: DC | PRN
Start: 2022-11-17 — End: 2022-11-17
  Administered 2022-11-17 (×2): 501.000 mL via TOPICAL

## 2022-11-17 MED ORDER — TRAMADOL 50 MG TABLET: 50 mg | ORAL | Status: DC

## 2022-11-17 MED ORDER — HYDROMORPHONE 2 MG/ML INJECTION SOLUTION
2 | Status: CP
Start: 2022-11-17 — End: ?

## 2022-11-17 MED ORDER — ACETAMINOPHEN 325 MG TABLET
325 mg | ORAL | Status: DC
  Administered 2022-11-17 – 2022-11-20 (×9): 325 mg via ORAL

## 2022-11-17 MED ORDER — METHYLENE BLUE (ANTIDOTE) 5 MG/ML INTRAVENOUS SOLUTION
5 | Status: DC | PRN
Start: 2022-11-17 — End: 2022-11-17
  Administered 2022-11-17: 13:00:00 5 mg/mL via INTRADERMAL

## 2022-11-17 NOTE — Telephone Encounter
Telephone encounter opened in error.

## 2022-11-17 NOTE — Plan of Care
Plan of Care Overview/ Patient Status    Admission Note Nursing Erika Khan is a 52 y.o. female admitted with a chief complaint of Left DIEP flap. Patient arrived from  PACU.Patient is   A&Ox4. Hypertensive at times. RA no s/s of SOB. Pt reporting increase back pain relieved by repositioning. ATC tylenol and PRN tramadol administered. Anxiety noted. Pt in beach chair position tolerating well. L breast flap w/ vioptix, Q1hr flaps checks. R breast incision covered with gauze and Tegaderm. 4 JP drains in place w/ sanguinous output at this time. Hip to Hip incision covered with xeroform.Foley in place draining light green urine. Clear liquid diet tolerating well. Calm and cooperative with care. Call bell within reach. Frequent rounding preformed.  Vitals:  11/17/22 1615 11/17/22 1630 11/17/22 1728 11/17/22 1801 BP: 133/79 135/83 (!) 153/92  Pulse: 61 63 73  Resp: 15 18 20   Temp:   97.8 ?F (36.6 ?C)  TempSrc:   Oral  SpO2: 97% 97% 97%  Weight:    93.7 kg Height:    5' 7 (1.702 m) Oxygen therapy Oxygen TherapySpO2: 97 %Device (Oxygen Therapy): room airO2 Flow (L/min): 2Current Facility-Administered Medications Medication Dose Route Frequency Provider Last Rate Last Admin  acetaminophen (TYLENOL) tablet 975 mg  975 mg Oral Q6H Levi Aland, MD   975 mg at 11/17/22 1756  ceFAZolin (ANCEF) 1 g in sodium chloride 0.9% PF 10 mL (100 mg/mL)  1 g IV Push Q8H Jerral Bonito, MD      [facility] by provider] dilTIAZem CD (CARDIZEM CD) 24 hr capsule 120 mg  120 mg Oral Daily Ficocelli, [place], APRN      [START ON 11/18/2022] enoxaparin (LOVENOX) syringe 40 mg  40 mg Subcutaneous Daily Levi Aland, MD      [START ON 11/18/2022] levothyroxine (SYNTHROID, LEVOTHROID) tablet 137 mcg  137 mcg Oral Daily Ficocelli, [place], APRN      [START ON 11/18/2022] polyethylene glycol (MIRALAX) packet 17 g  17 g Oral Daily Levi Aland, MD .I have reviewed patient valuables Belongings charted in last 7 days: Patient Valuables   Patient Valuables Flowsheet                    PATIENT VALUABLE(S)       Clothing Disposition Remained with parent/guardian  husband has belongings 11/17/22 1745  Vision Disposition Remained with parent/guardian  remained with husband 11/17/22 0711          See flowsheets, patient education and plan of care for additional information. Electronically Signed by Ashley Royalty, RN, July 30, 2024Problem: Adult Inpatient Plan of CareGoal: Plan of Care ReviewOutcome: Interventions implemented as appropriateGoal: Patient-Specific Goal (Individualized)Outcome: Interventions implemented as appropriateGoal: Absence of Hospital-Acquired Illness or InjuryOutcome: Interventions implemented as appropriateGoal: Optimal Comfort and WellbeingOutcome: Interventions implemented as appropriateGoal: Readiness for Transition of CareOutcome: Interventions implemented as appropriate Problem: Wound Healing ProgressionGoal: Optimal Wound HealingOutcome: Interventions implemented as appropriate

## 2022-11-17 NOTE — Brief Op Note
[facility] HealthPatient Name: [facility]        [id number] Patient DOB: 10-04-1970     Surgery Date: 7/30/2024Surgeon(s) and Role:Panel 1:   * Erika Leigh, MD - PrimaryPanel 2:   * Erika Greaser, MD - Primary   * [place], Farrel Conners, MD - Co-surgeonAssistant(s):Fellow: Erika Khan, MDResident: Erika Lathe, MD; Erika Khan, MDStaff:  Circulator: Erika Khan, RNRelief Circulator: Erika Khan, RNScrub Person: Erika Khan, SharonMedical Student: Erika Khan, STUDENTPre-Op Diagnosis: Ductal carcinoma in situ (DCIS) of left breast [D05.12] Procedure(s) and Anesthesia Type:Panel 1:   * LEFT BREAST MASTECTOMY LEFT SENTIENL LYMPH NODE BIOPSY - GENERAL   * INJECTION PROC; IDENTIFICATION, SENTINEL NODE - GENERAL   * INTRAOP IDENTIFICATION(EG,MAPPING)SENTINEL LYMPH NODE(S)INCL INJECTION OF NON-RADIOACTIVE DYE - GENERAL   * BX/EXCISION, LYMPH NODE(S); OPEN, DEEP AXILLARY NODE(S) - GENERAL   * LYMPHATICS AND LYMPH NODES IMAGING - GENERALPanel 2:   * BREAST RECONSTRUCTION W/FREE FLAP - GENERAL   * IV INJ TO TEST BLOOD FLOW IN FLAP/GRAFT - GENERAL   * MICROSURGICAL TECHNIQUES, REQUIRING OPERATING MICROSCOPE - GENERAL   * NERVE REPAIR; WITH SYNTHETIC CONDUIT OR VEIN ALLOGRAFT (EG, NERVE TUBE), EACH NERVE [phone] - GENERAL   * NEUROPLASTY, MAJOR PERIPHERAL NERVE, ARM/LEG; BRACHIAL PLEXUS - GENERAL   * REDUCTION MAMMOPLASTY - GENERALOperative Findings (enter relevant operative findings; do not refer to an operative report that is not yet transcribed): as above. DIEP to L breast, and right mastpexy Signs of infection present at the time of surgery at the operative site: None Blood and Blood Products: none                 Drains:  2 drains in L breast, 2 drains in abdomen. Implants: Implant Name Type Inv. Item Serial No. Manufacturer Lot No. LRB No. Used Action DEVICE MICROVASC ANAS 3.0 BX/6 - [id number] Implant DEVICE MICROVASC ANAS 3.0 BX/6  [employer] [id number] Left 1 Implanted GRAFT TISSUE PERIPHERAL NERVE REPAIR DERMIS AVANCE 1-2X70MM - [id number] Implant GRAFT TISSUE PERIPHERAL NERVE REPAIR DERMIS AVANCE 1-2X70MM [id number] [employer] [id number] Left 1 Implanted CONNECTOR PERIPHERAL NERVE REPAIR MATRIX AXOGUARD 3X15MM - [id number] Implant CONNECTOR PERIPHERAL NERVE REPAIR MATRIX AXOGUARD 3X15MM  [employer]EN[employer] [id number] Left 1 Wasted  Specimens: ID Type Source Tests Collected by Time 1 : LEFT MASTECTOMY - SHORT STITCH SUPERIOR, LONG LATERAL Tissue Breast, Left PATHOLOGY ([facility]) Erika Leigh, MD 11/17/2022  9:05 AM 2 : LEFT BREAST INFERIOR FLAP - CLIP MARKS TRUE MARGIN Tissue Breast, Left PATHOLOGY ([facility]) Erika Leigh, MD 11/17/2022  9:08 AM 3 : LEFT SENTINEL LYMPH NODES Tissue Lymph Node, Sentinel PATHOLOGY ([facility]) Erika Leigh, MD 11/17/2022  9:20 AM 4 : RIGHT BREAST TISSUE Tissue Breast, Right PATHOLOGY [facility]) Erika Greaser, MD 11/17/2022  9:43 AM 5 : LEFT MASTECTOMY SKIN Tissue Breast, Left PATHOLOGY [facility]) Erika Greaser, MD 11/17/2022 12:44 PM  Clinical Staging: naEBL: 50 mL      Post Operative Diagnosis: * No post-op diagnosis entered * Erika Khan, MD7/30/20242:49 PM

## 2022-11-17 NOTE — Other
7/30/2024PRE-OP DIAGNOSIS: Ductal carcinoma in situ (DCIS) of left breast [D05.12]POST-OP DIAGNOSIS: Post-Op Diagnosis Codes:   * Ductal carcinoma in situ (DCIS) of left breast [D05.12]Procedure(s):LEFT BREAST MASTECTOMY LEFT SENTIENL LYMPH NODE BIOPSYINJECTION PROC; IDENTIFICATION, SENTINEL NODEINTRAOP IDENTIFICATION(EG,MAPPING)SENTINEL LYMPH NODE(S)INCL INJECTION OF NON-RADIOACTIVE DYEBX/EXCISION, LYMPH NODE(S); OPEN, DEEP AXILLARY NODE(S)LYMPHATICS AND LYMPH NODES IMAGINGBREAST RECONSTRUCTION W/FREE FLAPIV INJ TO TEST BLOOD FLOW IN FLAP/GRAFTMICROSURGICAL TECHNIQUES, REQUIRING OPERATING MICROSCOPENERVE REPAIR; WITH SYNTHETIC CONDUIT OR VEIN ALLOGRAFT (EG, NERVE TUBE), EACH NERVE 64910NEUROPLASTY, MAJOR PERIPHERAL NERVE, ARM/LEG; BRACHIAL PLEXUSSURGEON: Surgeon(s) and Role:Panel 1:   Erika Leigh, MD - PrimaryPanel 2:   * Erika Greaser, MD - Primary   * Khan, Erika Conners, MD - Co-surgeonANESTHESIAJudd Khan FINDINGS: Left breast mastectomy - short stitch superior, long stitch lateralLeft sentinel lymph nodesINDICATION FOR PROCEDURE: This patient presents with a 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 female brought to the operating room for definitive surgery of the left breast with DIEP flap reconstruction with Dr. Cedric Khan.  The patient was informed of the possible risks and complications of the procedure, including but not limited to anesthetic risks, bleeding, infection, and need for additional surgery. The patient concurred with the proposed plan, and has given informed consent.  The site of surgery was properly noted/marked in the preoperative holding area.The patient was then brought to the operating room and placed in the supine position with both upper extremities extended.General anesthesia was induced . Perioperative antibiotics were administered consisting of 2g of Ancef and a time out was performed confirming the patient, site, and procedure. The bilateral chest and axilla was then prepped and draped in the usual sterile fashion.The patient was injected with 0.43 mci of Technicium  In the left lateral areolar border to facilitate sentinel lymph node identification. 2cc of Methylene blue dye was injected in the same area in the subareolar plane.We turned our attention to the left breast where an elliptical incision was made to incorporate the nipple areolar complex. The incision was made with a 10-blade and deepened through the subcutaneous tissues with Bovie electrocautery. Skin flaps were raised to the clavicle superiorly, to the lateral border of the sternum medially, to the inframammary fold inferiorly, and to the anterior border of the latissimus dorsi muscle laterally. Through the lateral aspect of the mastectomy incision we turned our attention to theleft axilla.The gamma probe was used to identify an area of increased radioactivity within the lower hair-bearing region of the axilla. The clavipectoral sheath was sharply incised to reveal the level I axillary lymph nodes. The probe was used to identify a single node with increased radioactivity. This node was brought into the operative field and carefully dissected free of the surrounding lymphovascular structures. Any other lymph nodes that were either blue or hot were also excised and submitted to pathology for routine section.  The wound was irrigated with normal saline, and hemostasis was achieved with Bovie electrocautery. The breast tissue was sharply excised off the chest wall taking care to incorporate the pectoralis fascia while leaving the serratus fascia behind. The resulting mastectomy specimen was marked short stitch superior, long stitch lateral. The breast was sent to pathology for permanent evaluation.Sentinel Node Biopsy for Breast Cancer - LeftOperation performed with curative intent. Yes Tracer(s) used to identify sentinel nodes in the upfront surgery (non-neoadjuvant) setting (select all that apply). Dye and Radioactive tracer Tracer(s) used to identify sentinel nodes in the neoadjuvant setting (select all that apply). N/A All nodes (colored  or non-colored) present at the end of a dye-filled lymphatic channel were removed. Yes All significantly radioactive nodes were removed. Yes All palpably suspicious nodes were removed. Yes Biopsy-proven positive nodes marked with clips prior to chemotherapy were identified and removed. N/A The plastic surgery team then assumed care of the patient to finish the reconstructive portion of the operation.ESTIMATED BLOOD LOSS: MinimalDRAINS: UreTHral Catheter 11/17/22 0754 100% silicone 16 5 19  (Active) COMPLICATIONS: None apparentDISPOSITION: Remained intubated in OR with plastic surgery serviceElizabeth Godfrey Pick, MD MS FACSAssistant Professor of [facility], [facility]

## 2022-11-17 NOTE — Utilization Review (ED)
UM Status: Meets Inpatient StatusLEFT BREAST MASTECTOMY LEFT SENTIENL LYMPH NODE BIOPSY (Left)INJECTION PROC; IDENTIFICATION, SENTINEL NODE (Left)INTRAOP IDENTIFICATION(EG,MAPPING)SENTINEL LYMPH NODE(S)INCL INJECTION OF NON-RADIOACTIVE DYE (Left)BX/EXCISION, LYMPH NODE(S); OPEN, DEEP AXILLARY NODE(S) (Left)LYMPHATICS AND LYMPH NODES IMAGING (Left)BREAST RECONSTRUCTION W/FREE FLAP (Left)IV INJ TO TEST BLOOD FLOW IN FLAP/GRAFT (Left)MICROSURGICAL TECHNIQUES, REQUIRING OPERATING MICROSCOPE (Left)NERVE REPAIR; WITH SYNTHETIC CONDUIT OR VEIN ALLOGRAFT (EG, NERVE TUBE), EACH NERVE 64910 (Left)NEUROPLASTY, MAJOR PERIPHERAL NERVE, ARM/LEG; BRACHIAL PLEXUS (Left)REDUCTION MAMMOPLASTY (Right) Per Auth/Cert, [id number]#:[id number] the case approved inpatient or outpatient? inpt

## 2022-11-17 NOTE — Other
Post Anesthesia Transfer of Care NotePatient: Lilianne ReidProcedure(s) Performed: Procedure(s) (LRB):LEFT BREAST MASTECTOMY LEFT SENTIENL LYMPH NODE BIOPSY (Left)INJECTION PROC; IDENTIFICATION, SENTINEL NODE (Left)INTRAOP IDENTIFICATION(EG,MAPPING)SENTINEL LYMPH NODE(S)INCL INJECTION OF NON-RADIOACTIVE DYE (Left)BX/EXCISION, LYMPH NODE(S); OPEN, DEEP AXILLARY NODE(S) (Left)LYMPHATICS AND LYMPH NODES IMAGING (Left)BREAST RECONSTRUCTION W/FREE FLAP (Left)IV INJ TO TEST BLOOD FLOW IN FLAP/GRAFT (Left)MICROSURGICAL TECHNIQUES, REQUIRING OPERATING MICROSCOPE (Left)NERVE REPAIR; WITH SYNTHETIC CONDUIT OR VEIN ALLOGRAFT (EG, NERVE TUBE), EACH NERVE 64910 (Left)NEUROPLASTY, MAJOR PERIPHERAL NERVE, ARM/LEG; BRACHIAL PLEXUS (Left)REDUCTION MAMMOPLASTY (Right)Last Vitals: I have reviewed the post-operative vital signs during the handoff as noted in the Epic chart.POSTOP HANDOFF :      Patient Location:  PACU     Level of Consciousness:  Awake and alert     VS stable since last recorded intra-op set? Yes       Oxygen source: nasal cannulaPatient co-morbidities, intra-operative course, intake & output and antibiotics as per Anesthesia record were discussed with the RN.

## 2022-11-17 NOTE — Other
[facility]        [id number] Patient DOB: 1970-06-26     Surgery Date: 7/30/2024Surgeon(s) and Role:Panel 2:   * Karma Greaser, MD - PrimaryAssistant(s):Addagatla, Farrel Conners, MD - AssistantFellow: Lendon Collar, MDResident: Eden Lathe, MD; Levi Aland, MDStaff:  Circulator: Kathrynn Ducking, RNRelief Circulator: Jennette Kettle, RNScrub Person: Engineer, production, Lolita Patella, SharonMedical Student: Arlice Colt, STUDENTPre-Op Diagnosis: Ductal carcinoma in situ (DCIS) of left breast [D05.12] Procedure(s) and Anesthesia Type:Panel 2:  * LEFT IMMEDIATE BREAST RECONSTRUCTION WITH DIEP FREE FLAP  (G2694/W5462)   * Intravascular injection of ICG to assess perfusion in flap (70350)   * LEFT BREAST RECONSTRUCTION W/FREE FLAP (09381)   * RIGHT BREAST REDUCTION (82993)    *Neurolysis of LEFT intercostal nerve (71696)   * MICROSURGICAL TECHNIQUES, REQUIRING OPERATING MICROSCOPE -(CPT [phone])   *Neurotization of LEFT breast flap using nerve allograft (64910)Clinical note: After informed consent was obtained, the patient was marked in the preoperative holding area.  The abdomen was marked using abdominoplasty markings however slightly higher to incorporate the periumbilical perforators and the breast pattern was marked using a periareolar marking. The patient was then brought into the operating room and placed supine on the operating table. SCDs were placed on left lower extremity. She was given preoperative antibiotics IV and Subcutaneous lovenox. All pressure points were padded. She was then placed under general anesthesia. Foley catheter was placed. Next, the abdomen and chest were then prepped and draped in a standard surgical fashion. Operative Findings: Dr. Luster Landsberg  then proceeded with her portion of the procedure. Please see her dictation for narrative. We began our dissection with the abdominal flaps. Using a 10-blade scalpel, the lower incision was incised and taken down through the skin and subcutaneous tissue using electrocautery carefully avoiding any injury to the superficial epigastric veins which were dissected distally and clipped with small clips and brought with the flap. Next, the upper abdominal incision was then made and then taken down through the skin and subcutaneous tissue using electrocautery beveling superiorly. This was then taken down to the rectus fascia and the abdominal flaps were then elevated from the lateral aspect dissecting medially with the Bovie electrocautery on a very low setting. We then proceeded to give 20 cc of our block mix (steroids + marcaine) per each abdominal side. We then proceeded to lif the flap using 3.5 loupe magnification. The right abdominal flap was found to have large lateral perforators. We split the flap in the middle. We decided to base the abdominal flap off of the lateral row perforators on each side keeping the abdominal paddle intact. The perforators were freed from the rectus muscle fascia using tenotomy scissors and circumferentially dissected through the rectus muscle using bipolar electrocautery.  The perforators were dissected down to the deep inferior epigastric pedicle and this was taken down to the takeoff of the vessels.  The vessels were circumferentially dissected.  Once this was done, the remaining perforators were clipped with medium size hemoclips and cut with tenotomy scissors. Next, we proceeded with the chest dissection. The left mastectomy skin was examined and found to be viable. The edges were trimmed with Gorney scissors to bleeding tissue.  The pectoralis muscle was then split using electrocautery overlying the 3rd rib and retracted with a Weitlaner for exposure. The cartilage from the 3rd rib was identified and the periosteum was incised in a vertical fashion elevating superiorly and inferiorly off the cartilage using a Glorious Peach and a  periosteal elevator. Cartilage was then removed using a rongeur carefully avoiding injury to the underlying pleura. The vessels were identified using a Camera operator and a Therapist, nutritional. The intercostal muscle was removed inferiorly and superiorly to allow for better exposure of the vessels and this was done using bipolar electrocautery. The vessels were then freed circumferentially and there appeared to be good sized artery and vein at least a 2- to 3-mm vessel. Hemostasis was achieved.  The abdominal flap was then prepared by clipping the deep inferior epigastric vessels with a medium-sized HemoClips at the most inferior aspect of the pedicle and cut with the tenotomy scissors and the flap was then brought up to the chest wall and secured with staples. The artery and the vein of the flap pedicles were flushed with heparinized saline solution and found to have good flow with no resistance. Next, we began preparing the chest wall vessels using microvascular clamps to clamp the artery and the vein proximally and distally and the vessels were cut with tenotomy scissors down the midline and flushed with heparinized saline solution. The right flap deep inferior epigastric vessels were anastomosed to the left chest wall. The venous anastomosis was done with the vein first using a 3.0 Synovis coupler and the artery anastomosis was then performed next using an 8-0 nylon suture in an interrupted fashion using microscopic technique. The microvascular clamps were removed and there appeared to be patent flow from the artery and vein and the flap showed adequate blood flow with no congestion. Neurolysis of the left intercostal nerve was performed. The nerve allograft was used to suture the intercostal nerve to a nerve within the flap using 8-0 nylon suture. 2x JP drains were placed laterally and IMF and secured with 2-0 Silk suture and the flap was placed back into the pocked and secured using staples  The abdominal fascia was then closed on the right side with 1 Prolene running fashion baseball stitch superiorly and imbricating the suture line with another 1 Prolene running mattress suture inferiorly. Once this was performed, the abdomen was then irrigated copiously with normal saline irrigation solution. Two #15 round Blake drains were placed out through the lateral flank and secured in place with 2-0 silk sutures and then placed into the abdominal wound. Next, the abdomen was irrigated with normal saline solution and the abdominal flap was then brought down and secured into place with skin staplers after the patient was placed in a reflexed position. The other half of the abdominal flap was removed and discarded. The wounds were then closed in layers using interrupted 2-0 Vicryl suture to close the Scarpa's layer and the deep dermis was closed with interrupted 3-0 Monocryl sutures. Skin was then closed with running 3-0 Biosyn. The umbilical site was marked using a marking pen in the shape of an oval at the level of the superior iliac spine prior to closing. This was cut out using a 15-blade scalpel and the skin was removed. The umbilicus was then brought out through the new opening and secured into placed with interrupted 3-0 Vicryl sutures to close the deep dermis and interrupted 4-0 Monocryl sutures in a half-buried mattress fashion to close the skin. Next, we turned our attention to the breast flap which was found to be warm with good capillary refill and no congestion. The superior portion of the flap was marked in a circular pattern and de-epithelialized. The remaining abdominal flap was then de-epithelialized. Hemostasis was achieved. The flap was then placed back into  the pocked and secured with staples.  The skin was closed in layers using interrupted 3-0 Monocryl sutures to close the deep dermis and the skin was then closed with a running 3-0 Monocryl suture in a subcuticular fashion. The flap was again assessed and found to have a good Doppler flow with no congestion. Using the hand-held Doppler, 1 arterial signal was found on the flap and marked with a 5-Nylon suture. Dressings were then applied including burn gauze to the chest and xeroform to the abdominal incision and the umbilicus was covered with an opsite. The lower abdominal incision was covered with Xeroform, 4x4 gauze and tegaderms.  The drains were then covered with tegaderms. Vioptix were placed over the skin paddle. The patient was then extubated and brought out to the bed. The flap was reexamined and found to have adequate flow and then she was brought to recovery room in stable condition. All sponge and instrument counts were correct at the end of the case. There were no complications.   I WAS PRESENT DURING THE ENTIRE CASE AND SCRUBBED IN FOR ALL KEY PORTIONS OF THE CASE.  Signs of infection present at the time of surgery at the operative site: None Blood and Blood Products: none                  Drains: (6)Jackson-Pratt drain(s) with closed bulb suction in the 2 per breast; 2 in abdomen Mastectomy weights:Left: 876 gRight 220 gRight: 730 g (went in left chest)Implants: Implant Name Type Inv. Item Serial No. Manufacturer Lot No. LRB No. Used Action DEVICE MICROVASC ANAS 3.0 BX/6 - [id number] Implant DEVICE MICROVASC ANAS 3.0 BX/6  [employer] [id number] Left 1 Implanted GRAFT TISSUE PERIPHERAL NERVE REPAIR DERMIS AVANCE 1-2X70MM - [id number] Implant GRAFT TISSUE PERIPHERAL NERVE REPAIR DERMIS AVANCE 1-2X70MM [id number] [employer] [id number] Left 1 Implanted CONNECTOR PERIPHERAL NERVE REPAIR MATRIX AXOGUARD 3X15MM - [id number] Implant CONNECTOR PERIPHERAL NERVE REPAIR MATRIX AXOGUARD 3X15MM  [employer] [id number] Left 1 Wasted  Specimens: ID Type Source Tests Collected by Time 1 : LEFT MASTECTOMY - SHORT STITCH SUPERIOR, LONG LATERAL Tissue Breast, Left PATHOLOGY ([facility]) Burnard Leigh, MD 11/17/2022  9:05 AM 2 : LEFT BREAST INFERIOR FLAP - CLIP MARKS TRUE MARGIN Tissue Breast, Left PATHOLOGY ([facility]) Burnard Leigh, MD 11/17/2022  9:08 AM 3 : LEFT SENTINEL LYMPH NODES Tissue Lymph Node, Sentinel PATHOLOGY ([facility]) Burnard Leigh, MD 11/17/2022  9:20 AM 4 : RIGHT BREAST TISSUE Tissue Breast, Right PATHOLOGY [facility]) Karma Greaser, MD 11/17/2022  9:43 AM 5 : LEFT MASTECTOMY SKIN Tissue Breast, Left PATHOLOGY ([facility]) Karma Greaser, MD 11/17/2022 12:44 PM    EBL:50  mL       Post Operative Diagnosis: Malignant neoplasm of left female breast Karma Greaser, MD  Dr. Cedric Fishman was the primary surgeon on this case. Dr. Quintin Alto was my assistant and was necessary for the successful completion of this case due to the complex nature of the procedure and patient's condition.The attending co-surgeon was essential throughout the duration of the case, working independently and concurrently, for the proper positioning, manipulation of instruments, proper exposure, manipulation of tissue, and wound closure.

## 2022-11-17 NOTE — Other
Division of Surgical Oncology	Section of Breast Surgery	Surgically Focused H&PHISTORY OF PRESENT Erika Khan How is a 52 y.o. 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 She has a history of prior left breast lumpectomy with radiation in 2015 for DCIS followed by 5 years of Tamoxifen. She presents for a Left Mastectomy with Left Sentinel Lymph Node Biopsy followed by immediate autologous reconstruction.She underwent a CTA for Plastic Surgery surgical planning and then a PET/[place] to further evaluate L3-S1 lesions, which were not hypermetabolic on PET.She was last seen in clinic on 10/14/2022.REPRODUCTIVE HISTORY: Patient's age at first menses was 70.  She is peri-menopausal.  She is a G60P1 and her age of first delivery was 2.  She denies any history of OCP use, denies any fertility or hormonal replacement therapy use.  PAST MEDICAL HISTORY:Past Medical History: Diagnosis Date  Breast cancer (HC Code)   Motion sickness   Thyroid cancer (HC Code)   papillary thyroid cancer PAST SURGICAL HISTORY:Past Surgical History: Procedure Laterality Date  BREAST SURGERY  2015  Left breat lumpectomy  THYROID SURGERY  2008  Tyroid removed Family History:Family History Problem Relation Age of Onset  Breast cancer Paternal Grandmother       Breat cancer- mastectomy  Breast cancer Maternal Aunt       Breast Cancer  Early death Brother       Accidental death/fall SOCIAL HISTORY:Social History Tobacco Use  Smoking status: Never  Smokeless tobacco: Never Vaping Use  Vaping Use: Never used Substance Use Topics  Alcohol use: Yes   Alcohol/week: 2.0 standard drinks of alcohol   Types: 2 Shots of liquor per week   Comment: occasionally  Drug use: Never Medications: No current facility-administered medications for this encounter.Current Outpatient Medications: dilTIAZem CD, TAKE 1 CAPSULE (120 MG [name]) BY MOUTH IN THE MORNING  levothyroxine, Take 1 tablet (137 mcg total) by mouth.Allergies: Patient has no allergy information on record.Review of systems: 14 point review of systems negativePHYSICAL EXAM   Ht 5' 7 (1.702 m)  - Wt 90.7 kg  - BMI 31.32 kg/m? Gen: AO x3, NADCV: normal rate and regular rhythm, well perfusedPulmonary: Clear to auscultation bilaterally, equal bilateral chest rise with normal effort on room airBreast: The breasts are symmetric. There is no supraclavicular, infraclavicular or cervical adenopathy.  There are no palpable right axillary nodes, and exam of the right breast is normal without evidence of disease. There are no skin or nipple changes. There are no palpable left axillary nodes, and exam of the left breast is normal without evidence of disease. There are radiation changes noted to the left breast skin and a noticeable indent at the 3:00 position. There are no skin or nipple changes. PATHOLOGY: BIOPSY PATHOLOGY REPORT DIAGNOSIS: Left breast, lateral, middle depth, anterior to lumpectomy site, grouped calcifications, stereotactic-guided 9 gauge core needle biopsies (5 cores): DUCTAL CARCINOMA IN SITU (DCIS), NUCLEAR GRADE 3 OF 3 -Architectural patterns: Comedo- and solid-types -Comedonecrosis: Present; central expansive -Microcalcifications: Present; associated with DCIS as well as non-neoplastic tissue Pathology findings are concordant with imaging findings. BIRADS 6-Known Biopsy Proven Malignancy-Appropriate Action Should be Taken RECOMMENDATION: Consultation with a breast oncology specialist is recommended and has been scheduled. Patient has been informed of the diagnosis. Final report signed by Despina Hidden, MD Assessment:This is a 52 y.o. 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 She has a history of prior left breast lumpectomy with radiation in 2015 for  DCIS followed by 5 years of Tamoxifen. She presents for a Left Mastectomy with Left Sentinel Lymph Node Biopsy followed by immediate autologous reconstruction.Plan:- Proceed with Surgery- Consent signed in clinic on 10/14/2022- Patient marked in pre-op area- Admit to floor post-opThomas Danelle Earthly, MD PGY6Section of Breast SurgeryDivision of Surgical OncologyDepartment of SurgeryYale School of Medicine

## 2022-11-17 NOTE — Brief Op Note
[facility] HealthPatient [facility]        [id number] Patient DOB: February 12, 1971     Surgery Date: 7/30/2024Surgeon(s) and Role:Panel 1:   * Burnard Leigh, MD - PrimaryPanel 2:   * Karma Greaser, MD - PrimaryAssistant(s):Fellow: Lendon Collar, MDResident: Eden Lathe, MD; Levi Aland, MDStaff:  Circulator: Kathrynn Ducking, RNScrub Person: Mariana Kaufman, SharonMedical Student: Arlice Colt, STUDENTPre-Op Diagnosis: Ductal carcinoma in situ (DCIS) of left breast [D05.12] Procedure(s) and Anesthesia Type:Panel 1:   * LEFT BREAST MASTECTOMY LEFT SENTIENL LYMPH NODE BIOPSY - GENERAL   * INJECTION PROC; IDENTIFICATION, SENTINEL NODE - GENERAL   * INTRAOP IDENTIFICATION(EG,MAPPING)SENTINEL LYMPH NODE(S)INCL INJECTION OF NON-RADIOACTIVE DYE - GENERAL   * BX/EXCISION, LYMPH NODE(S); OPEN, DEEP AXILLARY NODE(S) - GENERAL   * LYMPHATICS AND LYMPH NODES IMAGING - GENERALPanel 2:   * BREAST RECONSTRUCTION W/FREE FLAP - GENERAL   * IV INJ TO TEST BLOOD FLOW IN FLAP/GRAFT - GENERAL   * MICROSURGICAL TECHNIQUES, REQUIRING OPERATING MICROSCOPE - GENERAL   * NERVE REPAIR; WITH SYNTHETIC CONDUIT OR VEIN ALLOGRAFT (EG, NERVE TUBE), EACH NERVE [phone] - GENERAL   * NEUROPLASTY, MAJOR PERIPHERAL NERVE, ARM/LEG; BRACHIAL PLEXUS - GENERALOperative Findings (enter relevant operative findings; do not refer to an operative report that is not yet transcribed): Left Skin Sparing mastectomy with sentinel lymph node biopsy. One hot lymph node identified (900 counts, with background after of 15 counts). Turned over to Plastic Surgery for reconstruction.Signs of infection present at the time of surgery at the operative site: None Blood and Blood Products: none                 Drains:  See Plastic Surgery NoteImplants: * No implants in log * Specimens: ID Type Source Tests Collected by Time 1 : LEFT MASTECTOMY - SHORT STITCH SUPERIOR, LONG LATERAL Tissue Breast, Left PATHOLOGY ([facility]) Burnard Leigh, MD 11/17/2022  9:05 AM 2 : LEFT BREAST INFERIOR FLAP - CLIP MARKS TRUE MARGIN Tissue Breast, Left PATHOLOGY ([facility]) Burnard Leigh, MD 11/17/2022  9:08 AM 3 : LEFT SENTINEL LYMPH NODES Tissue Lymph Node, Sentinel PATHOLOGY [facility] Burnard Leigh, MD 11/17/2022  9:15 AM  Clinical Staging: 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

## 2022-11-17 NOTE — Anesthesia Pre-Procedure Evaluation
This is a 51 y.o. female scheduled for LEFT BREAST MASTECTOMY LEFT SENTIENL LYMPH NODE BIOPSY (Left)INJECTION PROC; IDENTIFICATION, SENTINEL NODE (Left)INTRAOP IDENTIFICATION(EG,MAPPING)SENTINEL LYMPH NODE(S)INCL INJECTION OF NON-RADIOACTIVE DYE (Left)BX/EXCISION, LYMPH NODE(S); OPEN, DEEP AXILLARY NODE(S) (Left)LYMPHATICS AND LYMPH NODES IMAGING (Left)BREAST RECONSTRUCTION W/FREE FLAPIV INJ TO TEST BLOOD FLOW IN FLAP/GRAFT (Left)MICROSURGICAL TECHNIQUES, REQUIRING OPERATING MICROSCOPE (Left)NERVE REPAIR; WITH SYNTHETIC CONDUIT OR VEIN ALLOGRAFT (EG, NERVE TUBE), EACH NERVE 64910 (Left)NEUROPLASTY, MAJOR PERIPHERAL NERVE, ARM/LEG; BRACHIAL PLEXUS (Left).Review of Systems/ Medical HistoryPatient summary, nursing notes, EKG/Cardiac Studies , Labs, pre-procedure vitals, height, weight and NPO status reviewed.No previous anesthesia concernsAnesthesia Evaluation:   No history of anesthetic complications  Estimated body mass index is 31.32 kg/m? as calculated from the following:  Height as of this encounter: 5' 7 (1.702 m).  Weight as of this encounter: 90.7 kg. CC/HPI: 51 yo F w' PMHx of PVCs, thyroid cancer, hypothyroidism, obesity, and recurrent breast cancer (DCIS) who is scheduled for left breast mastectomy. Past Medical History:No date: Breast cancer (HC Code)No date: Motion sicknessNo date: Thyroid cancer (HC Code)    Comment:  papillary thyroid cancerAllergies: No allergies on file. 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 Pt was started on Cardizem 120mg  once daily. Holter monitor after starting the diltizem (07/14/19) revealed PVCs consistent with 0.8% of the total QRS complexes.. Gastrointestinal/Genitourinary: -Nutritional Disorders: Pt is obese per BMI definition--Reproductive disorders:  Patient has breast neoplasm.-Comments: DCIS in 2015 now with recurrent scheduled for left breast mastectomy. Endocrine/Metabolic: -Thyroid Disorders:  Patient has hypothyroidism.-Comments: Papillary thyroid cancer s/p resectionAdditional Findings: VITAL SIGNS: LABS:Lab Results     Component                Value               Date                     GLU                      83                  11/04/2022          Physical ExamCardiovascular:    normal exam  Pulmonary:  normal exam  Airway:  Mallampati: IITM distance: >3 FBNeck ROM: fullMouth Opening: >3cmDental:  unremarkable  Anesthesia PlanASA 2 The primary anesthesia plan is  general. Perioperative Code Status confirmed: It is my understanding that the patient is currently designated as 'Full Code' and will remain so throughout the perioperative period.Anesthesia informed consent obtained. Consent obtained from: patientUse of blood products: consented  The post operative pain plan is IV analgesics and per surgeon management.The post operative pain plan was performed at surgeon's request.Plan discussed with Attending and Resident.Anesthesiologist's Pre Op NoteI personally evaluated and examined the patient prior to the intra-operative phase of care on the day of the procedure.Marland Kitchen

## 2022-11-17 NOTE — Interval H&P Note
Subsequent to admission for surgery or invasive procedure, I have reassessed the patient by examination and review of relevant data pertaining to the planned procedure. I have verified the planned procedure and there are no relevant changes since the H&P. Marland KitchenLoukya presents today for L breast mastectomy, sentinel lymph node bx and left breast autologous reconstruction using abdominal based free flaps and  symmetrizing right breast mastopexy/reduction.NADRRR, normal S1, S2CTAB, no rhonchi, no ralesWWP No contraindications to proceeding to OR as plannedAnesthesia pre-procedure evaluationRisks and benefits discussed with patient, who expresses understanding; informed consent signedPlan to admit postoperatively

## 2022-11-17 NOTE — Anesthesia Post-Procedure Evaluation
Anesthesia Post-op NotePatient: Erika ReidProcedure(s):  Procedure(s) (LRB):LEFT BREAST MASTECTOMY LEFT SENTIENL LYMPH NODE BIOPSY (Left)INJECTION PROC; IDENTIFICATION, SENTINEL NODE (Left)INTRAOP IDENTIFICATION(EG,MAPPING)SENTINEL LYMPH NODE(S)INCL INJECTION OF NON-RADIOACTIVE DYE (Left)BX/EXCISION, LYMPH NODE(S); OPEN, DEEP AXILLARY NODE(S) (Left)LYMPHATICS AND LYMPH NODES IMAGING (Left)BREAST RECONSTRUCTION W/FREE FLAP (Left)IV INJ TO TEST BLOOD FLOW IN FLAP/GRAFT (Left)MICROSURGICAL TECHNIQUES, REQUIRING OPERATING MICROSCOPE (Left)NERVE REPAIR; WITH SYNTHETIC CONDUIT OR VEIN ALLOGRAFT (EG, NERVE TUBE), EACH NERVE 64910 (Left)NEUROPLASTY, MAJOR PERIPHERAL NERVE, ARM/LEG; BRACHIAL PLEXUS (Left)REDUCTION MAMMOPLASTY (Right) Last Vitals:  I have reviewed the post-operative vital signs as noted in the Epic chart.POSTOP EVALUATION:      Patient Recovery Location:  PACU     Vital Signs Status:  Stable     Patient Participation:  Patient participated     Mental Status:  Awake     Respiratory Status:  Acceptable     Airway Patency:  Patent     Cardiovascular/Hydration Status:  Stable     Pain Management:  Satisfactory to patient     Nausea/Vomiting Status:  Satisfactory to patientNo notable events documented.

## 2022-11-17 NOTE — Other
Operative Diagnosis:Pre-op:   Ductal carcinoma in situ (DCIS) of left breast [D05.12] Patient Coded Diagnosis   Pre-op diagnosis: Ductal carcinoma in situ (DCIS) of left breast  Post-op diagnosis: Ductal carcinoma in situ (DCIS) of left breast  Patient Diagnosis   Pre-op diagnosis: Ductal carcinoma in situ (DCIS) of left breast [D05.12]  Post-op diagnosis:     Post-op diagnosis:   * Ductal carcinoma in situ (DCIS) of left breast [D05.12]Operative Procedure(s) :Procedure(s) (LRB):LEFT BREAST MASTECTOMY LEFT SENTIENL LYMPH NODE BIOPSY (Left)INJECTION PROC; IDENTIFICATION, SENTINEL NODE (Left)INTRAOP IDENTIFICATION(EG,MAPPING)SENTINEL LYMPH NODE(S)INCL INJECTION OF NON-RADIOACTIVE DYE (Left)BX/EXCISION, LYMPH NODE(S); OPEN, DEEP AXILLARY NODE(S) (Left)LYMPHATICS AND LYMPH NODES IMAGING (Left)BREAST RECONSTRUCTION W/FREE FLAP (Left)IV INJ TO TEST BLOOD FLOW IN FLAP/GRAFT (Left)MICROSURGICAL TECHNIQUES, REQUIRING OPERATING MICROSCOPE (Left)NERVE REPAIR; WITH SYNTHETIC CONDUIT OR VEIN ALLOGRAFT (EG, NERVE TUBE), EACH NERVE 64910 (Left)NEUROPLASTY, MAJOR PERIPHERAL NERVE, ARM/LEG; BRACHIAL PLEXUS (Left)REDUCTION MAMMOPLASTY (Right)Post-op Procedure & Diagnosis ConfirmationPost-op Diagnosis: Post-op Diagnosis confirmed (no changes)Post-op Procedure: Post-op Procedure confirmed (no changes)

## 2022-11-18 LAB — CBC WITHOUT DIFFERENTIAL
BKR WAM ANC (ABSOLUTE NEUTROPHIL COUNT): 11.37 x 1000/ÂµL — ABNORMAL HIGH (ref 2.00–7.60)
BKR WAM HEMATOCRIT (2 DEC): 32.7 % — ABNORMAL LOW (ref 35.00–45.00)
BKR WAM HEMOGLOBIN: 11.6 g/dL — ABNORMAL LOW (ref 11.7–15.5)
BKR WAM MCH (PG): 28.2 pg (ref 27.0–33.0)
BKR WAM MCHC: 35.5 g/dL (ref 31.0–36.0)
BKR WAM MCV: 79.6 fL — ABNORMAL LOW (ref 80.0–100.0)
BKR WAM MPV: 11.6 fL (ref 8.0–12.0)
BKR WAM PLATELETS: 240 x1000/ÂµL (ref 150–420)
BKR WAM RDW-CV: 12.7 % (ref 11.0–15.0)
BKR WAM RED BLOOD CELL COUNT.: 4.11 M/ÂµL (ref 4.00–6.00)
BKR WAM WHITE BLOOD CELL COUNT: 13.9 x1000/ÂµL — ABNORMAL HIGH (ref 4.0–11.0)

## 2022-11-18 LAB — BASIC METABOLIC PANEL
BKR ANION GAP: 10 (ref 7–17)
BKR BLOOD UREA NITROGEN: 9 mg/dL (ref 6–20)
BKR BUN / CREAT RATIO: 12.2 (ref 8.0–23.0)
BKR CALCIUM: 8.7 mg/dL — ABNORMAL LOW (ref 8.8–10.2)
BKR CHLORIDE: 101 mmol/L (ref 98–107)
BKR CO2: 21 mmol/L (ref 20–30)
BKR CREATININE: 0.74 mg/dL (ref 0.40–1.30)
BKR EGFR, CREATININE (CKD-EPI 2021): >60 mL/min/{1.73_m2} (ref >=60)
BKR GLUCOSE: 118 mg/dL — ABNORMAL HIGH (ref 70–100)
BKR POTASSIUM: 4.3 mmol/L (ref 3.3–5.3)
BKR SODIUM: 132 mmol/L — ABNORMAL LOW (ref 136–144)

## 2022-11-18 MED ORDER — DIAZEPAM 2 MG TABLET
2 | Freq: Four times a day (QID) | ORAL | Status: DC | PRN
Start: 2022-11-18 — End: 2022-11-20
  Administered 2022-11-19: 01:00:00 2 mg via ORAL

## 2022-11-18 NOTE — Plan of Care
Plan of Care Overview/ Patient Status    0700-1900: Patient is alert and oriented x4. VSS on RA, with no s/s of respiratory distress noted at this time. Lungs diminished upon auscultation. Patient reporting pain to lower back, relieved by ATC tylenol, PRN tramadol and repositioning. Patient gets OOB with assist x1 and RW. Continent of bowel and bladder. Medications administered per MAR, pills whole. IV access to bilateral hands. Left breast flap with viptox, Q1hr checks continued, WNL. Right breast incision covered with gauze and Tegaderm. Hip to hip incision OTA with surgical glue. 4x J/P drains in place with sanguinous output at this time. Safety maintained. Call bell within reach. Frequent rounding performed throughout shift.BP 139/81  - Pulse (!) 56  - Temp 98.1 ?F (36.7 ?C) (Oral)  - Resp 15  - Ht 5' 7 (1.702 m)  - Wt 93.7 kg  - SpO2 97%  - Breastfeeding No  - BMI 32.35 kg/m? Electronically Signed by Berle Mull, RN, July 31, 2024Problem: Adult Inpatient Plan of CareGoal: Plan of Care ReviewOutcome: Interventions implemented as appropriateGoal: Patient-Specific Goal (Individualized)Outcome: Interventions implemented as appropriateGoal: Absence of Hospital-Acquired Illness or InjuryOutcome: Interventions implemented as appropriateGoal: Optimal Comfort and WellbeingOutcome: Interventions implemented as appropriateGoal: Readiness for Transition of CareOutcome: Interventions implemented as appropriate Problem: Wound Healing ProgressionGoal: Optimal Wound HealingOutcome: Interventions implemented as appropriate Problem: Fall Injury RiskGoal: Absence of Fall and Fall-Related InjuryOutcome: Interventions implemented as appropriate

## 2022-11-18 NOTE — Progress Notes
PLASTIC AND RECONSTRUCTIVE SURGERY PROGRESS NOTES/P L SSM, DIEP free flap and R breast reduction/mastopexy on 11/17/2022 Patient without complaints. Pain controlled. Tolerating diet, denies nausea or vomiting. Her Foley has been removed, she has voided spontaneously. Ambulated in room with stand by assistance and walker.BP 139/81  - Pulse (!) 56  - Temp 98.1 ?F (36.7 ?C) (Oral)  - Resp 15  - Ht 5' 7 (1.702 m)  - Wt 93.7 kg  - SpO2 97%  - Breastfeeding No  - BMI 32.35 kg/m?   L breast: Soft without underlying fluid collection. Flap warm with <3 sec cap refill. Strong, arterial doppler with handheld doppler. No drainage from incisions. No ecchymosis of skin paddle. JP drains to suction w/ ss output. Vioptix at 77% with good signal.  R breast: Incisions CDI. Dressing in place without strikethrough. JP drain in place draining ss. Bilateral abdominal incision dressing clean, dry, intact.  JP drains x 2 with serosanguinous output. Continue current plan. Electronically Signed by Adolphus Birchwood, NP, November 18, 2022

## 2022-11-18 NOTE — Care Coordination-Inpatient
Care Management NoteRecommendation for discharge to home with services (RN for JP). Spoke to patient about discharge planning. Patient agreed with recommendation. Patient had no initial agency preference. Patient was given Repisodic agency list. Patient agreed to open search. Referrals sent.----------------------------------------------------------------------Rian Koon Beckie Busing, MSHATransition CoordinatorDepartment of Care ManagementPhone: [phone]

## 2022-11-18 NOTE — Progress Notes
PLASTIC AND RECONSTRUCTIVE SURGERY PROGRESS NOTE60 y.o. female with a history of DCIS now s/p L breast SSM, L DIEP, R breast reduction/mastopexy on 11/17/2022.Patient without complaints. Pain well controlled. Tolerating clear liquid diet w/o nausea or vomiting. Her Foley will be removed at 4:00 AM. L breast: Soft without underlying fluid collection. Flap warm with <3 sec cap refill. Strong, arterial doppler with handheld doppler. No drainage from incisions. No ecchymosis of skin paddle. JP drains to suction w/ ss output. Vioptix at 61-62% with good signal. R breast: Incisions c/d/I. Dressing in place without strikethrough. JP drain in place draining ss. Abdomen: Bilateral abdominal incisions/dressing clean, dry, intact. JP drains x 2 with ss output. Continue current plan. Overnight resident appropriately updated. Electronically Signed by Jerold Coombe, PA-C November 18, 2022

## 2022-11-18 NOTE — Other
PLASTIC AND RECONSTRUCTIVE SURGERY POST-OP NOTEPRS Attending: Dr. Radene Journey of Present Illness25 y.o. female with a history of DCIS now s/p L breast SSM, DIEP, R breast reduction/mastopexy on 11/17/2022.Interval EventsUneventful PACU course and was transferred to the floor.Denies pain to breasts b/l, reports lower back pain 2/2 herniated disc - will order lidocaine patch Foley in place draining light green urine, to be removed at 4 AM. No chest pain, no shortness of breath.Tolerating CLDNo nausea or vomiting.Has not noted active bleeding.Physical ExamBP (!) 153/91  - Pulse 63  - Temp 97.3 ?F (36.3 ?C) (Temporal)  - Ht 5' 7 (1.702 m)  - Wt 90.7 kg  - SpO2 98%  - BMI 31.32 kg/m? Intake/Output Summary (Last 24 hours) at 11/17/2022 1523Last data filed at 11/17/2022 1437Gross per 24 hour Intake 1150 ml Output 465 ml Net 685 ml Drains: 1) R breast: 5 cc2) Left breast 1: 10cc3) Left breast 2: 5 cc 4) L abdomen: 5 ccNAD, AAOx3Unlabored respirations on RAR breast: Incisions c/d/I. Dressing in place without strikethrough. JP drain x1 in place draining ss. L breast: Soft without underlying fluid collection. Flap warm with <3 sec cap refill. Strong, arterial doppler with handheld doppler at marking stitch. No drainage from incisions. No ecchymosis of skin paddle. JP drains x2 to self suction w/ ss output. Vioptix at 78% with good signal quality. Abdomen: Bilateral abdominal incisions/dressing clean, dry, intact. JP drains x1 with ss output. Review of DiagnosticsNo results for input(s): WBC, HGB, HCT, PLT in the last 72 hours.No results for input(s): NA, K, CL, CO2, BUN, CREATININE, GLU, CALCIUM, MG, PHOS in the last 72 hours. Assessment/Plan52 y.o. female with a history of DCIS now s/p L breast SSM, DIEP, R breast reduction/mastopexy on 11/17/2022.Neurovascular flap and doppler checks Q 1 hours x48 hours then Q2hPain control: Toradol 15mg  IV Q 6 hours x 48 hours, Tylenol ATC, Tramadol PRN, Valium 5mg  PO PRN for muscle spasmsLidocaine patches for lower back pain, must remain in beach chair position but can use pillows under hips to offload patient's weightScheduled bowel regimenClear liquid diet overnight; will advance to regular diet POD 1 if appropriateIVFs: LR @ 31mL/hr Wound care: reinforce xeroform dressings PRN NO surgical bra until dischargeJP drains to self suction, strip and record Q4h. Teach pt self care Ancef IV while in patient, will DC on DuricefActivity: Semi-fowlers position when in bedClosely monitor VS, I's & O's with goal UOP > 47mL/hr - will bolus for low UOP. Foley to be removed at 4am POD 1Home meds reviewed DVT prophylaxis: Lovenox to start in am if no evidence of hematoma; Venodyne boots at all timesEncourage IS hourly Dispo: will need VNA services for drain care, anticipate discharge home POD 2. Will need Lovenox x1 week (if BMI >30, lovenox for 1 month) Plastic and Reconstructive Surgery Contact [address] [address] [facility] Floor patients (24/7): Ethlyn Daniels [facility] Plastic Surgery Floor provider on [facility] or page [phone] [facility] WJXBJYNW [phone]), floor patients and new consults: [phone], [phone] (5p-7a) and weekends, floor patients and new consults: [phone] use the information above for the first call. For new facial and hand trauma consults, please check Amion to determine the appropriate team on call for the week.

## 2022-11-18 NOTE — Progress Notes
[facility]	 [place] [facility]	SURGERY PROGRESS Erika Khan is a 52 y.o. year-old female  with a history of DCIS now 1 Day Post-Op s/p L breast SSM, DIEP, R breast reduction/mastopexy on 11/17/2022. INTERIM EVENTS - NAEON- Tolerating CLD this AM- Foley removed, has not yet voided- L axillary drain sanguinous, all other drains serosanguinous- Pain well controlled this AMOBJECTIVE Physical ExamTemp:  [97 ?F (36.1 ?C)-98.1 ?F (36.7 ?C)] 98.1 ?F (36.7 ?C)Pulse:  [60-80] 60Resp:  [15-24] 19BP: (126-153)/(70-92) 127/80SpO2:  [96 %-98 %] 96 %Device (Oxygen Therapy): room airO2 Flow (L/min):  [2] 2General:  NADChest: Symmetric chest expansion, normal work of breathing. Breast:  Soft, flap warm with <3 sec cap refill. Incisions c/d/I; no ecchymosis. JP drains x2 to self suction w/ sanguinous and ss output. Abdomen: Abdominal incisions c/d/I, JP drain with ss output LabsRecent Labs Lab 07/31/240430 WBC 13.9* HGB 11.6* HCT 32.70* PLT 240 Recent Labs Lab 07/31/240430 NA 132* K 4.3 CL 101 CO2 21 BUN 9 CREATININE 0.74 GLU 118* CALCIUM 8.7* No results for input(s): ALT, AST, ALKPHOS, GGT, BILITOT, BILIDIR, AMYLASE, LIPASE in the last 168 hours.No results for input(s): PROTIME, PTT, INR in the last 168 hours.ImagingNo results found.ASSESSMENT AND PLAN Erika Khan is a 52 y.o. female with a history of DCIS now 1 Day Post-Op s/p L breast SSM, DIEP, R breast reduction/mastopexy on 11/17/2022. Plan- Pain control with Lidocaine patches for back pain- CLD, ADAT- Monitor drain output- Care per [facility] team Electronically Signed VO:HYWV Otis Brace, MD PGY 17/31/2024MHB [phone] ADDENDUM

## 2022-11-18 NOTE — Plan of Care
Plan of Care Overview/ Patient Status  Problem: Adult Inpatient Plan of CareGoal: Readiness for Transition of CareOutcome: Initial problem identification   Per chart: 52 y.o. female with a history of DCIS now s/p L breast SSM, DIEP, R breast reduction/mastopexy on 7/30/2024Pt is independent with all daily care needs without the use of DME. Pt is not active with any home care agency - she will discharge home with JP drains, TC to offer VNA services. Pt has [place] benefit if family is unable to transport home. Case Management will take lead to arrange post -acute care services and continue to work with the care team as the patient progresses towards discharge.Case Management Screening and Evaluation  Flowsheet Row Most Recent Value Case Management Screening: Chart review completed. If YES to any question below then proceed to CM Eval/Plan  Is there a change in their cognitive function No Do you anticipate that the pt will have any discharge needs requiring CM intervention? Yes Has there been a readmission within the last 30 days and/or four (4) encounters (encounters include: ED, OBS, Inpatient) within the last six (6) months? No Were there services prior to admission ( Examples: Assisted Living, HD, Homecare, Extended Care Facility, Methadone, SNF, Outpatient Infusion Center) No Negative/Positive Screen Positive Screening: Complete CM Evaluation and Plan Case Manager Attestation  I have reviewed the medical record and completed the above screen. CM staff will follow patient's progress and discuss the plan of care with the Treatment Team. Yes Case Management Evaluation and Plan  Arrived from prior to admission home/apartment/condo Do you have a caregiver, or do you anticipate the need for a caregiver given the change in your physicial function? No  Lives With Spouse Services Prior to Admission none Patient Requires Care Coordination Intervention Due To discharge planning needs/concerns Prior to Hospitalization: Assistance Needed/DME being used None Documented Insurance Accurate Yes Any financial concerns related to anticipated discharge needs No Patient's home address verified Yes Patient's PCP of record verified Yes Source of Clinical History  Patient's clinical history has been reviewed and source of Information is: Medical record Case Manager Attestation  I have reviewed the medical record and completed the above evaluation with the following recommendations. Yes Discharge Planning Coordination Recommendations  Discharge Planning Coordination Recommendations Home with Homecare Services Case Manager reviewed plan of care/ continuum of care need's with  Interdisciplinary Team  Elinor Parkinson, RN, MSNFloat Care ManagerMHB: [phone]

## 2022-11-18 NOTE — Plan of Care
Plan of Care Overview/ Patient Status    (1900-0700) - Patient A/Ox4.  VSS on RA.  Q1 hr FLAP checks completed (venous doppler & Vioptics).  Tolerating clear liquid diet.  Skin - hip to hip incision (open to air), L FLAP (open to air) and R breast lift (covered w/ gauze & tegaderm).  IV - R & L hands.  4 JP drains (2 L breast, 1 R hip & 1 L hip w/ sanguinous output). Single dose of IV Morphine administered for unrelated back pain.   Lidocaine placed on lower back.  Call bell w/in reach.  Patient calls appropriately.

## 2022-11-18 NOTE — Other
POST-OP CHECKAdmit Date: 7/30/2024Hospital Day: 1Day of Surgery s/p Procedure(s) (LRB):LEFT BREAST MASTECTOMY LEFT SENTIENL LYMPH NODE BIOPSY (Left)INJECTION PROC; IDENTIFICATION, SENTINEL NODE (Left)INTRAOP IDENTIFICATION(EG,MAPPING)SENTINEL LYMPH NODE(S)INCL INJECTION OF NON-RADIOACTIVE DYE (Left)BX/EXCISION, LYMPH NODE(S); OPEN, DEEP AXILLARY NODE(S) (Left)LYMPHATICS AND LYMPH NODES IMAGING (Left)BREAST RECONSTRUCTION W/FREE FLAP (Left)IV INJ TO TEST BLOOD FLOW IN FLAP/GRAFT (Left)MICROSURGICAL TECHNIQUES, REQUIRING OPERATING MICROSCOPE (Left)NERVE REPAIR; WITH SYNTHETIC CONDUIT OR VEIN ALLOGRAFT (EG, NERVE TUBE), EACH NERVE 64910 (Left)NEUROPLASTY, MAJOR PERIPHERAL NERVE, ARM/LEG; BRACHIAL PLEXUS (Left)REDUCTION MAMMOPLASTY (Right)Code status: Full CodeAllergy: Patient has no known allergies.SUBJECTIVE:Patient is observed lying in bed. She is complaining of severe back pain and wants to change her position and lie on side, she reports that it will make pain better. But is otherwise doing well post-operatively. Denies nausea or vomiting.  She has not been OOB.  Foley is in place.  Denies passing flatus or having BM.  Has been given incentive spirometer and instructed in its use.  Venodynes in place.OBJECTIVE:VITALS:Temp:  [97 ?F (36.1 ?C)-98 ?F (36.7 ?C)] 98 ?F (36.7 ?C)Pulse:  [61-80] 66Resp:  [15-24] 18BP: (126-153)/(70-92) 147/90SpO2:  [97 %-98 %] 97 %Device (Oxygen Therapy): room airO2 Flow (L/min):  [2] 2I/Os:I/O last 3 completed shifts:In: 1150 [I.V.:1150]Out: 465 [Urine:415; Blood:50]PHYSICAL EXAM:General: Pt is AAOx3Heart: RRR no M/R/GLungs: CTAB, no wheeze or rhonchiAbdomen: soft, non-distended, non-tender, lower abdominal incision covered by xeroform, CDI, no hematoma or swelling noted, 1 drain with serosang outputBreast: B/l breast incisions CDI, 3 drains on left breast draining dark serosang fluid. No hematoma and mild edema which is expected. Left flap biphasic on doppler. No skin discolorations.Musculoskeletal: WWP, moving all 4 extremities, sensation intactASSESSMENT/PLAN:Erika Khan is a 52 y.o. female who is now s/p  Left Skin Sparing mastectomy with sentinel lymph node biopsy by Breast team and DIEP to L breast and right mastpexy by PRS teamPlan:- Pain control- Lidocaine patches- CLD tonight, then ADAT- Keep foley now and remove in am- Monitor drain output- DVT ppx per PRS teamNazokat Cass Khan, MD7/30/20248:23 PM

## 2022-11-18 NOTE — Progress Notes
PLASTIC AND RECONSTRUCTIVE SURGERY PROGRESS NOTEPRS Attending: Dr. Radene Journey of Present Illness47 y.o. female with a history of DCIS now s/p L breast SSM, DIEP, R breast reduction/mastopexy on 11/17/2022.Interval EventsPain controlled Foley removed this AM Tolerating CLD, will advance this AM Physical ExamBP 127/80  - Pulse 60  - Temp 98.1 ?F (36.7 ?C) (Oral)  - Resp 19  - Ht 5' 7 (1.702 m)  - Wt 93.7 kg  - SpO2 96%  - Breastfeeding No  - BMI 32.35 kg/m? Intake/Output Summary (Last 24 hours) at 11/18/2022 0651Last data filed at 11/18/2022 0423Gross per 24 hour Intake 1409.26 ml Output 2080 ml Net -670.74 ml Drains: RLQ 45LLQ 25LL mid ax 65LL breast 55NAD, AAOx3Unlabored respirations on RAR breast: Incisions c/d/I. Dressing in place without strikethrough. JP drain x1 in place draining ss. L breast: Soft without underlying fluid collection. Flap warm with <3 sec cap refill. Strong, arterial doppler with handheld doppler at marking stitch. No drainage from incisions. No ecchymosis of skin paddle. JP drains x2 to self suction w/ ss output. Vioptix at 76% with good signal quality. Abdomen: Bilateral abdominal incisions/dressing clean, dry, intact. JP drains x1 with ss output. Review of DiagnosticsRecent Labs   07/31/240430 WBC 13.9* HGB 11.6* HCT 32.70* PLT 240 Recent Labs   07/31/240430 NA 132* K 4.3 CL 101 CO2 21 BUN 9 CREATININE 0.74 GLU 118* CALCIUM 8.7*  Assessment/Plan52 y.o. female with a history of DCIS now s/p L breast SSM, DIEP, R breast reduction/mastopexy on 11/17/2022.Neurovascular flap and doppler checks Q 1 hours x48 hours then Q2hPain control: Toradol 15mg  IV Q 6 hours x 48 hours, Tylenol ATC, Tramadol PRN, Valium 5mg  PO PRN for muscle spasmsLidocaine patches for lower back pain, must remain in beach chair position but can use pillows under hips to offload patient's weightScheduled bowel regimenRegular dietWound care: reinforce xeroform dressings PRN NO surgical bra until dischargeJP drains to self suction, strip and record Q4h. Teach pt self care Ancef IV while in patient, will DC on DuricefActivity: Semi-fowlers position when in bedClosely monitor VS, I's & O's with goal UOP > 69mL/hr - will bolus for low UOP. Home meds reviewed DVT prophylaxis: Lovenox to start in am if no evidence of hematoma; Venodyne boots at all timesEncourage IS hourly Dispo: will need VNA services for drain care, anticipate discharge home POD 2. Will need Lovenox x1 week (if BMI >30, lovenox for 1 month) Plastic and Reconstructive Surgery Contact Information[facility] [address] [facility] Floor patients (24/7): Ethlyn Daniels [facility] Plastic Surgery Floor provider on [facility] or page [phone] [facility] [address] [id number] [phone]), floor patients and new consults: [phone], [phone] (5p-7a) and weekends, floor patients and new consults: [phone] use the information above for the first call. For new facial and hand trauma consults, please check Amion to determine the appropriate team on call for the week.

## 2022-11-19 MED ORDER — TRAMADOL 50 MG TABLET
50 | ORAL_TABLET | Freq: Four times a day (QID) | ORAL | 1 refills | Status: AC | PRN
Start: 2022-11-19 — End: ?

## 2022-11-19 MED ORDER — DIAZEPAM 2 MG TABLET
2 | ORAL_TABLET | Freq: Three times a day (TID) | ORAL | 1 refills | Status: AC | PRN
Start: 2022-11-19 — End: ?

## 2022-11-19 MED ORDER — ACETAMINOPHEN 325 MG TABLET
325 | ORAL_TABLET | Freq: Four times a day (QID) | ORAL | 1 refills | Status: AC
Start: 2022-11-19 — End: ?

## 2022-11-19 MED ORDER — CEFADROXIL 500 MG CAPSULE
500 | ORAL_CAPSULE | Freq: Two times a day (BID) | ORAL | 1 refills | Status: AC
Start: 2022-11-19 — End: ?

## 2022-11-19 MED ORDER — ONDANSETRON 4 MG DISINTEGRATING TABLET
4 | ORAL_TABLET | Freq: Three times a day (TID) | ORAL | 1 refills | Status: AC | PRN
Start: 2022-11-19 — End: ?

## 2022-11-19 MED ORDER — POLYETHYLENE GLYCOL 3350 17 GRAM ORAL POWDER PACKET
17 | Freq: Every evening | ORAL | Status: AC | PRN
Start: 2022-11-19 — End: ?

## 2022-11-19 MED ORDER — ENOXAPARIN 40 MG/0.4 ML SUBCUTANEOUS SYRINGE
40 | SUBCUTANEOUS | 1 refills | Status: AC
Start: 2022-11-19 — End: ?

## 2022-11-19 NOTE — Progress Notes
PLASTIC AND RECONSTRUCTIVE SURGERY PROGRESS NOTEPRS Attending: Dr. Radene Journey of Present Illness52 y.o. female with a history of DCIS now s/p L breast SSM, DIEP, R breast reduction/mastopexy on 11/17/2022.Interval EventsPain controlled AmbulatingTolerating po w/o n/v Doing wellPhysical ExamBP (!) 143/84  - Pulse 60  - Temp 98.3 ?F (36.8 ?C) (Oral)  - Resp 18  - Ht 5' 7 (1.702 m)  - Wt 93.7 kg  - SpO2 96%  - Breastfeeding No  - BMI 32.35 kg/m? Intake/Output Summary (Last 24 hours) at 11/19/2022 0626Last data filed at 11/19/2022 0602Gross per 24 hour Intake -- Output 1350 ml Net -1350 ml Drains: RLQ 40 (45)LLQ 20 (25)LL mid ax 60 (65)LL breast 20 (55)NAD, AAOx3Unlabored respirations on RAR breast: Incisions c/d/I. Dressing in place without strikethrough. JP drain x1 in place draining ss. L breast: Soft without underlying fluid collection. Flap warm with <3 sec cap refill. Strong, arterial doppler with handheld doppler at marking stitch. No drainage from incisions. No ecchymosis of skin paddle. JP drains x2 to self suction w/ ss output. Vioptix at 77% with good signal quality. Abdomen: Bilateral abdominal incisions/dressing clean, dry, intact. JP drains x1 with ss output. Review of DiagnosticsRecent Labs   07/31/240430 WBC 13.9* HGB 11.6* HCT 32.70* PLT 240 Recent Labs   07/31/240430 NA 132* K 4.3 CL 101 CO2 21 BUN 9 CREATININE 0.74 GLU 118* CALCIUM 8.7*  Assessment/Plan52 y.o. female with a history of DCIS now s/p L breast SSM, DIEP, R breast reduction/mastopexy on 11/17/2022. Recovering well. Neurovascular flap and doppler checks Q 1 hours x48 hours then Q2hPain control: Toradol 15mg  IV Q 6 hours x 48 hours, Tylenol ATC, Tramadol PRN, Valium 5mg  PO PRN for muscle spasmsLidocaine patches for lower back pain, must remain in beach chair position but can use pillows under hips to offload patient's weightScheduled bowel regimenRegular dietWound care: reinforce xeroform dressings PRN NO surgical bra until dischargeJP drains to self suction, strip and record Q4h. Teach pt self care Ancef IV while in patient, will DC on DuricefActivity: Semi-fowlers position when in bedClosely monitor VS, I's & O's with goal UOP > 57mL/hr - will bolus for low UOP. DVT prophylaxis: LovenoxEncourage IS hourly Dispo: will need VNA services for drain care, anticipate discharge today. Will need Lovenox for 30 days. Plastic and Reconstructive Surgery Contact Information[facility] [address] [facility] Floor patients (24/7): Ethlyn Daniels [facility] Plastic Surgery Floor provider on [facility] or page [phone] [facility] WJXBJYNW [phone]), floor patients and new consults: [phone], [phone] (5p-7a) and weekends, floor patients and new consults: [phone] use the information above for the first call. For new facial and hand trauma consults, please check Amion to determine the appropriate team on call for the week.

## 2022-11-19 NOTE — Progress Notes
[facility]	 [facility]	SURGERY PROGRESS Erika Khan is a 52 y.o. year-old female  with a history of DCIS now 2 Days Post-Op s/p L breast SSM, DIEP, R breast reduction/mastopexy on 11/17/2022. INTERIM EVENTS - NAEON- Tolerating diet- Pain well controlled this AM- Drains are more serousOBJECTIVE Physical ExamTemp:  [98 ?F (36.7 ?C)-98.8 ?F (37.1 ?C)] 98.3 ?F (36.8 ?C)Pulse:  [56-76] 60Resp:  [15-20] 18BP: (122-143)/(75-84) 143/84SpO2:  [96 %-97 %] 96 %Device (Oxygen Therapy): room airGeneral:  NADChest: Symmetric chest expansion, normal work of breathing. Breast:  Soft, flap warm with <3 sec cap refill. Incisions c/d/I; no ecchymosis. JP drains x2 to self suction w/ ss output. Abdomen: Abdominal incisions c/d/I, JP drain with ss output LabsRecent Labs Lab 07/31/240430 WBC 13.9* HGB 11.6* HCT 32.70* PLT 240 Recent Labs Lab 07/31/240430 NA 132* K 4.3 CL 101 CO2 21 BUN 9 CREATININE 0.74 GLU 118* CALCIUM 8.7* No results for input(s): ALT, AST, ALKPHOS, GGT, BILITOT, BILIDIR, AMYLASE, LIPASE in the last 168 hours.No results for input(s): PROTIME, PTT, INR in the last 168 hours.ImagingNo results found.ASSESSMENT AND PLAN Erika Khan is a 52 y.o. female with a history of DCIS now 2 Day Post-Op s/p L breast SSM, DIEP, R breast reduction/mastopexy on 11/17/2022. Plan- Pain control, continue current regimen- Monitor drain output- Care per [facility] team Electronically Signed ZO:XWRU Otis Brace, MD PGY 18/1/2024MHB [phone] ADDENDUM

## 2022-11-19 NOTE — Progress Notes
PLASTIC AND RECONSTRUCTIVE SURGERY PROGRESS NOTES/P L SSM, DIEP free flap and R breast reduction/mastopexy on 11/17/2022 Patient ambulating, OOBTC, tolerating po w/o n/v. BP 131/85  - Pulse 61  - Temp 98.2 ?F (36.8 ?C) (Oral)  - Resp 20  - Ht 5' 7 (1.702 m)  - Wt 93.7 kg  - SpO2 96%  - Breastfeeding No  - BMI 32.35 kg/m?   L breast: Soft without underlying fluid collection. Flap warm with <3 sec cap refill. Strong, arterial doppler with handheld doppler. No drainage from incisions. No ecchymosis of skin paddle. JP drains to suction w/ ss output. Vioptix at 74% with good signal.  R breast: Incisions CDI. Dressing in place without strikethrough. JP drain in place draining ss. Bilateral abdominal incision dressing clean, dry, intact. JP drains x 2 with serosanguinous output. Continue current plan, d/c home tomorrow. Nicholas Lose, MD (PGY-1)Section of Plastic and Reconstructive SurgeryDepartment of [facility] HospitalConsult Pager: [phone] Pager: [phone]

## 2022-11-19 NOTE — Plan of Care
Plan of Care Overview/ Patient Status    1900-0700Neuro: Aox4.CV: no c/o chest pain.Resp: RA. Breaths even and unlabored. No c/o SOB.GI/GU: continent of b/b, LBM 7/29. Voiding spontaneously.Diet: regular.Meds: whole w/ water.Access: R20g and L18g.Mobility: SBA OOB w/ no assistive device.Skin: hip to hip incisions OTA with no drainage. Left breast flap, OTA with no drainage. Right breast dressing in place. X4 JP drains with serosang drainage.Pain: c/o right side abd pain and generalized lower back pain. ATC tylenol and PRN valium given with positive effect. Scheduled lidocaine patch applied to lower back.Q1hr flap checks.Q1hr vioptix.Safety maintained, bed locked in lowest position, bed alarm not indicated at this time, call light within reach, hourly rounding performed. See MAR and flowsheets for more.Problem: Adult Inpatient Plan of CareGoal: Plan of Care ReviewOutcome: Interventions implemented as appropriateGoal: Patient-Specific Goal (Individualized)Outcome: Interventions implemented as appropriateGoal: Absence of Hospital-Acquired Illness or InjuryOutcome: Interventions implemented as appropriateGoal: Optimal Comfort and WellbeingOutcome: Interventions implemented as appropriateGoal: Readiness for Transition of CareOutcome: Interventions implemented as appropriate Problem: Wound Healing ProgressionGoal: Optimal Wound HealingOutcome: Interventions implemented as appropriate Problem: Fall Injury RiskGoal: Absence of Fall and Fall-Related InjuryOutcome: Interventions implemented as appropriate

## 2022-11-19 NOTE — Plan of Care
Plan of Care Overview/ Patient Status    Pt is a/ox4, pt reports incision/drain discomfort, pain meds per mar. Lungs clear on Ra, no cough noted, no sob reported. +BS noted, abdomen soft/nontender, tolerating diet, voiding w/o difficulty, OOB standby. T/Pinde, R breast incision covered with gauze/tegarderm, dry/intact, L breast flap warm, good cap refill, +A doppler, Vioptix >70%, hip-hip incision with glue OTA, 4 JP drains noted dry/intact. Meds whole with water. Lovenox education done with pt and husband, drain teaching done with pt and husband. Call bell within reach, safety maintained, care explained, hourly rounds maintained, will continue to monitor. BP 131/85  - Pulse 61  - Temp 98.2 ?F (36.8 ?C) (Oral)  - Resp 20  - Ht 5' 7 (1.702 m)  - Wt 93.7 kg  - SpO2 96%  - Breastfeeding No  - BMI 32.35 kg/m? Whitman Hero, RN

## 2022-11-19 NOTE — Progress Notes
PLASTIC AND RECONSTRUCTIVE SURGERY PROGRESS NOTES/P L SSM, DIEP free flap and R breast reduction/mastopexy on 11/17/2022 BP 136/75  - Pulse 62  - Temp 98.8 ?F (37.1 ?C) (Oral)  - Resp 20  - Ht 5' 7 (1.702 m)  - Wt 93.7 kg  - SpO2 96%  - Breastfeeding No  - BMI 32.35 kg/m?   L breast: Soft without underlying fluid collection. Flap warm with <3 sec cap refill. Strong, arterial doppler with handheld doppler. No drainage from incisions. No ecchymosis of skin paddle. JP drains to suction w/ ss output. Vioptix at 74% with good signal.  R breast: Incisions CDI. Dressing in place without strikethrough. JP drain in place draining ss. Bilateral abdominal incision dressing clean, dry, intact.  JP drains x 2 with serosanguinous output. Continue current plan. Overnight resident appropriately updated. Jerold Coombe PA-C Department of Surgery Section of Plastic Surgery  Best Contact: MHB

## 2022-11-20 DIAGNOSIS — Z6832 Body mass index (BMI) 32.0-32.9, adult: Secondary | ICD-10-CM

## 2022-11-20 DIAGNOSIS — Z923 Personal history of irradiation: Secondary | ICD-10-CM

## 2022-11-20 DIAGNOSIS — E039 Hypothyroidism, unspecified: Secondary | ICD-10-CM

## 2022-11-20 DIAGNOSIS — Z85850 Personal history of malignant neoplasm of thyroid: Secondary | ICD-10-CM

## 2022-11-20 DIAGNOSIS — Z79899 Other long term (current) drug therapy: Secondary | ICD-10-CM

## 2022-11-20 DIAGNOSIS — D0512 Intraductal carcinoma in situ of left breast: Secondary | ICD-10-CM

## 2022-11-20 DIAGNOSIS — Z79890 Hormone replacement therapy (postmenopausal): Secondary | ICD-10-CM

## 2022-11-20 DIAGNOSIS — I10 Essential (primary) hypertension: Secondary | ICD-10-CM

## 2022-11-20 DIAGNOSIS — E669 Obesity, unspecified: Secondary | ICD-10-CM

## 2022-11-20 DIAGNOSIS — N6481 Ptosis of breast: Secondary | ICD-10-CM

## 2022-11-20 DIAGNOSIS — M545 Low back pain, unspecified: Secondary | ICD-10-CM

## 2022-11-20 NOTE — Plan of Care
Plan of Care Overview/ Patient Status    Neuro: A&Ox4, pleasantCV: VSS, hypertensive (140's/70's) at baselineResp: RA, no sob/resp distressPain: Abd well controlled w/ tramadol 75mg  PRN and ATC tylenol 975mg , see MAR for details. Lower back pain well controlled w/ lidocaine patch in place, CDIGI: +BS/+flatus. Last BM 8/1GU: Voiding spontaneously Skin: L breast flap, warm w/ <3 sec cap refill. Strong, arterial doppler w/ handheld doppler. Vioptix in the high 70's (75-78%). R breast w/ incisions covered in gauze and tegaderm, CDI. Q2H flap checks performed. Hip to hip incisions w/ dermabond, OTA/CDI. 4 JP's (RLQ, LLQ, LL mid ax, and LL breast) to bulb suction w/ small serosanguinous output, CDIAccess: L PIV 18G, +flush/CDI. R PIV 20G, +flush/CDIFluids/Meds: N/ADiet: Regular, pills whole. No n/vMobility: OOB SBAMISC: Spouse bedside overnight, supportive of patient and involved in care. Safety maintained. Hourly rounding maintained. Call light in place. Bed alarm not on, calls appropriately. See flowsheet/MAR for further detailsBP (!) 143/73  - Pulse 69  - Temp 97.5 ?F (36.4 ?C) (Oral)  - Resp 18  - Ht 5' 7 (1.702 m)  - Wt 93.7 kg  - SpO2 96%  - Breastfeeding No  - BMI 32.35 kg/m? Guadlupe Spanish, RNProblem: Adult Inpatient Plan of CareGoal: Plan of Care ReviewOutcome: Interventions implemented as appropriateGoal: Patient-Specific Goal (Individualized)Outcome: Interventions implemented as appropriateGoal: Absence of Hospital-Acquired Illness or InjuryOutcome: Interventions implemented as appropriateGoal: Optimal Comfort and WellbeingOutcome: Interventions implemented as appropriateGoal: Readiness for Transition of CareOutcome: Interventions implemented as appropriate Problem: Wound Healing ProgressionGoal: Optimal Wound HealingOutcome: Interventions implemented as appropriate Problem: Fall Injury RiskGoal: Absence of Fall and Fall-Related InjuryOutcome: Interventions implemented as appropriate

## 2022-11-20 NOTE — Discharge Summary
Erika Khan Discharge SummaryPatient Data:  Patient Name: Erika Khan Admit date: 11/17/2022 Age: 52 y.o. Discharge date: 11/20/2022 DOB: [birth year]	 Discharge Attending Physician: Karma Greaser, MD  MRN: [id number]	 Discharged Condition: good PCP: Maryjean Morn, APRN Disposition: Home with Homecare Services Principal Diagnosis: Left breast DCISComorbidities Comorbidities present on admission:Class 1 obesity noted, with BMI (Calculated): 32.3.  Secondary diagnoses occurring during hospitalization:  Post Discharge Follow Up Items: Issues to be Addressed Post Discharge:Drain removalPPx Lovenox x 30 daysCompletion of abxFuture reconstructive needs, if necessaryMedication changes on discharge (Full medication list at conclusion of this summary) :Current Discharge Medication List  New  Details acetaminophen (TYLENOL) 325 mg tablet Take 2 tablets (650 mg total) by mouth every 6 (six) hours.Start date: 11/19/2022  cefadroxil (DURICEF) 500 mg capsule Take 1 capsule (500 mg total) by mouth 2 (two) times daily for 10 days.Start date: 11/19/2022, End date: 11/29/2022  diazePAM (VALIUM) 2 mg tablet Take 1 tablet (2 mg total) by mouth every 8 (eight) hours as needed (muscle spasms) for up to 10 days.Start date: 11/19/2022, End date: 11/29/2022  enoxaparin (LOVENOX) 40 mg/0.4 mL syringe Inject 0.4 mLs (40 mg total) under the skin daily.Start date: 11/20/2022, End date: 12/20/2022  ondansetron (ZOFRAN-ODT) 4 mg disintegrating tablet Take 1 tablet (4 mg total) by mouth every 8 (eight) hours as needed for nausea for up to 7 days.Start date: 11/19/2022, End date: 11/26/2022  polyethylene glycol (MIRALAX) 17 gram packet Take 1 packet (17 g total) by mouth nightly as needed for constipation (Constipation). Mix in 8 ounces of water, juice, soda, coffee or tea prior to taking.Start date: 11/19/2022  traMADoL (ULTRAM) 50 mg tablet Take 1 tablet (50 mg total) by mouth every 6 (six) hours as needed for pain for up to 7 days.Start date: 11/19/2022, End date: 11/26/2022   Pending Labs and Tests: Pending Lab Results   Order Current Status  Surgical case     [facility]) In process  Follow-up Information:Berger, Wyvonnia [facility] [place] [phone] up8/03/2023 10:15 [facility] [address] [phone] Future Appointments Date Time Provider Department Khan 11/27/2022  8:00 AM Lenice Llamas, APRN [facility] 11/30/2022 10:15 AM Burnard Leigh, MD [facility] Hospital Course: 52 y.o. female with a history of DCIS now s/p L breast SSM, L DIEP, R breast reduction/mastopexy on 11/17/2022.The patient went to the operating room on 11/17/2022 for planned surgery by Drs. Haykal and Addagatla. Surgery was uncomplicated, she was extubated and in stable condition upon discharge to the PACU. She was later transferred to the surgical floor and had Q 1 hour flap checks which showed there was no vascular compromise of the flap. She was started on clears and had IV fluids overnight. Her pain was controlled with IV Toradol, PRN PO Oxycodone and PRN Valium for muscle spasms. JP drains were maintained to self suction. She was resumed on her home meds on POD 1, had IV Ancef x 48 hours, had SCDs and lovenox for DVT ppx, and was encouraged to use IS. She did well overnight, remaining afebrile with appropriate VS. Her UOP was maintained >70mL per hour. Flap checks were stable.On POD 1, she was encouraged OOB to a chair and to ambulate. Her foley catheter was discontinued and she voided spontaneously. Her diet was advanced to a regular diet, she tolerated this well.On POD 2, she was encouraged to ambulate around the unit, she was shown how to care for her JP drains. VNA services  have been arranged in anticipation of discharge home. Her Toradol completed and she was started on PO ibuprofen which in addition to oxycodone and/or valium for breakthrough pain controlled her pain. On POD 3, she was encouraged to ambulate. Drain care techniques were reinforced. She was discharged home with VNA to assist with drain care. She was discharged on Lovenox for 30 days based on risk factors. She was deemed stable for discharge home with medications and follow up listed below.  Pertinent Procedures or Surgeries: Procedure(s):LEFT BREAST MASTECTOMY LEFT SENTIENL LYMPH NODE BIOPSYINJECTION PROC; IDENTIFICATION, SENTINEL NODEINTRAOP IDENTIFICATION(EG,MAPPING)SENTINEL LYMPH NODE(S)INCL INJECTION OF NON-RADIOACTIVE DYEBX/EXCISION, LYMPH NODE(S); OPEN, DEEP AXILLARY NODE(S)LYMPHATICS AND LYMPH NODES IMAGINGBREAST RECONSTRUCTION W/FREE FLAPIV INJ TO TEST BLOOD FLOW IN FLAP/GRAFTMICROSURGICAL TECHNIQUES, REQUIRING OPERATING MICROSCOPENERVE REPAIR; WITH SYNTHETIC CONDUIT OR VEIN ALLOGRAFT (EG, NERVE TUBE), EACH NERVE 64910NEUROPLASTY, MAJOR PERIPHERAL NERVE, ARM/LEG; BRACHIAL PLEXUSREDUCTION MAMMOPLASTY Data: Pertinent lab findings:Recent Labs Lab 07/31/240430 WBC 13.9* HGB 11.6* HCT 32.70* PLT 240  No results for input(s): NEUTROPHILS, LABLYMP, LABEOS, BANDSP in the last 168 hours. Recent Labs Lab 07/31/240430 NA 132* K 4.3 CL 101 CO2 21 BUN 9 CREATININE 0.74 GLU 118* ANIONGAP 10  Recent Labs Lab 07/31/240430 CALCIUM 8.7*  No results for input(s): ALT, AST, ALKPHOS, BILITOT, BILIDIR in the last 168 hours. No results for input(s): PTT, LABPROT, INR in the last 168 hours. Microbiology:No results for input(s): LABBLOO, LABURIN, LOWERRESPIRA in the last 168 hours.Imaging: Imaging results last 24h: No results found.Diet:  Regular diet Mobility: Highest Level of mobility - ACTUAL: Mobility Level 7, Walk 25+ feet, AM PAC 22-23  Physical Exam Discharge vital signs: Vitals:  11/20/22 0437 BP: 139/84 Pulse: 60 Resp: 18 Temp: 98 ?F (36.7 ?C) Cognitive Status at Discharge: BaselinePhysical Exam (Copied from progress note exam on day of discharge) Drains: RLQ 60 (75)LLQ 40 (25)LL mid ax 70 (100)LL breast 60 (50) NAD, AAOx3Unlabored respirations on RAR breast: Incisions c/d/I. Dressing in place without strikethrough. JP drain x1 in place draining ss.  L breast: Soft, warm and flat without underlying fluid collection. CR <3 sec cap refill. Strong, arterial doppler with handheld doppler at marking stitch. No ecchymosis of skin paddle. JP drains x2 to self suction w/ ss output. Vioptix at 71% with good signal quality. Abdomen: Bilateral abdominal incisions/dressing clean, dry, intact. JP drains x 2  with ss output. 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  Past Surgical History: Procedure Laterality Date  BREAST SURGERY  2015  Left breat lumpectomy  THYROID SURGERY  2008  Tyroid removed  Social History Family History Social History Tobacco Use  Smoking status: Never  Smokeless tobacco: Never Substance Use Topics  Alcohol use: Yes   Alcohol/week: 2.0 standard drinks of alcohol   Types: 2 Shots of liquor per week   Comment: occasionally  Family History Problem Relation Age of Onset  Breast cancer Paternal Grandmother       Breat cancer- mastectomy  Breast cancer Maternal Aunt       Breast Cancer  Early death Brother       Accidental death/fall    Discharge Medications  Discharge: Current Discharge Medication List  START taking these medications  Details acetaminophen (TYLENOL) 325 mg tablet Take 2 tablets (650 mg total) by mouth every 6 (six) hours.Qty: 30 tablet, Refills: 0Start date: 11/19/2022  cefadroxil (DURICEF) 500 mg capsule Take 1 capsule (500  mg total) by mouth 2 (two) times daily for 10 days.Qty: 20 capsule, Refills: 0Start date: 11/19/2022, End date: 11/29/2022  diazePAM (VALIUM) 2 mg tablet Take 1 tablet (2 mg total) by mouth every 8 (eight) hours as needed (muscle spasms) for up to 10 days.Qty: 12 tablet, Refills: 0Start date: 11/19/2022, End date: 11/29/2022  enoxaparin (LOVENOX) 40 mg/0.4 mL syringe Inject 0.4 mLs (40 mg total) under the skin daily.Qty: 12 mL, Refills: 0Start date: 11/20/2022, End date: 12/20/2022  ondansetron (ZOFRAN-ODT) 4 mg disintegrating tablet Take 1 tablet (4 mg total) by mouth every 8 (eight) hours as needed for nausea for up to 7 days.Qty: 10 tablet, Refills: 0Start date: 11/19/2022, End date: 11/26/2022  polyethylene glycol (MIRALAX) 17 gram packet Take 1 packet (17 g total) by mouth nightly as needed for constipation (Constipation). Mix in 8 ounces of water, juice, soda, coffee or tea prior to taking.Start date: 11/19/2022  traMADoL (ULTRAM) 50 mg tablet Take 1 tablet (50 mg total) by mouth every 6 (six) hours as needed for pain for up to 7 days.Qty: 28 tablet, Refills: 0Start date: 11/19/2022, End date: 11/26/2022   CONTINUE these medications which have NOT CHANGED  Details dilTIAZem CD (CARDIZEM CD) 120 mg 24 hr capsule TAKE 1 CAPSULE (120 MG [name]) BY MOUTH IN THE MORNING  Associated Diagnoses: Hypertension, unspecified type  levothyroxine (SYNTHROID) 137 MCG tablet Take 1 tablet (137 mcg total) by mouth.  Associated Diagnoses: Hypothyroidism, unspecified type     Time: 30 minutes spent on the discharge of this patientElectronically Signed:Marisol Glazer Remo Lipps, APRNPlastic and Reconstructive Surgery24/7 Pager: [phone]

## 2022-11-20 NOTE — Progress Notes
PLASTIC AND RECONSTRUCTIVE SURGERY PROGRESS NOTEPRS Attending: Dr. Radene Journey of Present Illness70 y.o. female with a history of DCIS now s/p L breast SSM, DIEP, R breast reduction/mastopexy on 11/17/2022.Interval EventsNAEOPain controlled, appropriately ambulatingStable flap checksPhysical ExamBP 139/84  - Pulse 60  - Temp 98 ?F (36.7 ?C) (Oral)  - Resp 18  - Ht 5' 7 (1.702 m)  - Wt 93.7 kg  - SpO2 97%  - Breastfeeding No  - BMI 32.35 kg/m? Intake/Output Summary (Last 24 hours) at 11/20/2022 0624Last data filed at 11/20/2022 0441Gross per 24 hour Intake -- Output 230 ml Net -230 ml Drains: RLQ 60 (75)LLQ 40 (25)LL mid ax 70 (100)LL breast 60 (50)NAD, AAOx3Unlabored respirations on RAR breast: Incisions c/d/I. Dressing in place without strikethrough. JP drain x1 in place draining ss. L breast: Soft, warm and flat without underlying fluid collection. CR <3 sec cap refill. Strong, arterial doppler with handheld doppler at marking stitch. No ecchymosis of skin paddle. JP drains x2 to self suction w/ ss output. Vioptix at 76% with good signal quality. Abdomen: Bilateral abdominal incisions/dressing clean, dry, intact. JP drains x 2  with ss output. Review of DiagnosticsRecent Labs   07/31/240430 WBC 13.9* HGB 11.6* HCT 32.70* PLT 240 Recent Labs   07/31/240430 NA 132* K 4.3 CL 101 CO2 21 BUN 9 CREATININE 0.74 GLU 118* CALCIUM 8.7*  Assessment/Plan52 y.o. female with a history of DCIS now s/p L breast SSM, DIEP, R breast reduction/mastopexy on 11/17/2022. Recovering well. Neurovascular flap and doppler checks Q 1 hours x48 hours then Q2hPain control: Toradol 15mg  IV Q 6 hours x 48 hours, Tylenol ATC, Tramadol PRN, Valium 5mg  PO PRN for muscle spasmsLidocaine patches for lower back pain, must remain in beach chair position but can use pillows under hips to offload patient's weightScheduled bowel regimenRegular dietWound care: reinforce xeroform dressings PRN NO surgical bra until dischargeJP drains to self suction, strip and record Q4h. Teach pt self care Ancef IV while in patient, will DC on DuricefActivity: Semi-fowlers position when in bedClosely monitor VS, I's & O's with goal UOP > 64mL/hr - will bolus for low UOP. DVT prophylaxis: LovenoxEncourage IS hourly Dispo: will need VNA services for drain care, anticipate discharge today. Will need Lovenox for 30 days. Plastic and Reconstructive Surgery Contact InformationYork [facility] Floor patients (24/7): Ethlyn Daniels [facility] Plastic Surgery Floor provider on [facility] or page [phone] [facility] OZHYQMVH [phone]), floor patients and new consults: [phone], [phone] (5p-7a) and weekends, floor patients and new consults: [phone] use the information above for the first call. For new facial and hand trauma consults, please check Amion to determine the appropriate team on call for the week.

## 2022-11-20 NOTE — Progress Notes
[facility]	 [facility]	SURGERY PROGRESS Erika Khan is a 52 y.o. year-old female  with a history of DCIS now 3 Days Post-Op s/p L breast SSM, DIEP, R breast reduction/mastopexy on 11/17/2022. INTERIM EVENTS - NAEON- Tolerating diet- Pain controlled, OOB walking- Drains are mostly serousOBJECTIVE Physical ExamTemp:  [97.3 ?F (36.3 ?C)-98.3 ?F (36.8 ?C)] 98 ?F (36.7 ?C)Pulse:  [60-87] 60Resp:  [18-20] 18BP: (110-143)/(72-85) 139/84SpO2:  [95 %-97 %] 97 %Device (Oxygen Therapy): room airGeneral:  NADChest: Symmetric chest expansion, normal work of breathing. Breast:  Soft, flap warm with <3 sec cap refill. Incisions c/d/I; no ecchymosis. JP drains x2 to self suction w/ serous output. Abdomen: Abdominal incisions c/d/I, JP drain with serous output LabsRecent Labs Lab 07/31/240430 WBC 13.9* HGB 11.6* HCT 32.70* PLT 240 Recent Labs Lab 07/31/240430 NA 132* K 4.3 CL 101 CO2 21 BUN 9 CREATININE 0.74 GLU 118* CALCIUM 8.7* No results for input(s): ALT, AST, ALKPHOS, GGT, BILITOT, BILIDIR, AMYLASE, LIPASE in the last 168 hours.No results for input(s): PROTIME, PTT, INR in the last 168 hours.ImagingNo results found.ASSESSMENT AND PLAN Erika Khan is a 52 y.o. female with a history of DCIS now 2 Day Post-Op s/p L breast SSM, DIEP, R breast reduction/mastopexy on 11/17/2022. Plan- Pain control, continue current regimen- Monitor drain output- Care per PRS team - Fu in clinic w Dr. Luster Landsberg 8/12Electronically Signed JY:NWGN Otis Brace, MD PGY 18/2/2024ATTENDING ADDENDUM

## 2022-11-20 NOTE — Plan of Care
Plan of Care Overview/ Patient Status    Problem: Adult Inpatient Plan of CareGoal: Readiness for Transition of CareOutcome: Outcome(s) achievedCase Management Plan  Flowsheet Row Most Recent Value Discharge Planning  Patient/Patient Representative goals/treatment preferences for discharge are:  Home with services Patient/Patient Representative was presented with a list of facilities, agencies and/or dme providers and Referral(s) placed for: Homecare services Homecare Preference(s) [facility] Required Nursing Homecare Services available on 11/20/22 [facility] Mode of Transportation  Private car  (add comment for special considerations)  [VEYO] Patient accompanied by Family CM D/C Readiness  PASRR completed and approved N/A Authorization number obtained, if required N/A Is there a 3 day INPATIENT Qualifying stay for Medicare Patients? N/A Medicare IM- signed, dated, timed and scanned, if required N/A DME Authorized/Delivered N/A No needs identified/ follow up with PCP/MD N/A Post acute care services secured W10 complete Yes Pri Completed and Accepted  N/A Is the destination address correct on the W10 N/A Finalized Plan  Expected Discharge Date 11/20/22 Discharge Disposition Home-Health Care Svc  Home with [facility] services for SN with [facility]. Family transport.Shelbie Ammons, RN, BSN

## 2022-11-20 NOTE — Plan of Care
Plan of Care Overview/ Patient Status    0700-1100[name] was discharged via Hydrograph[employer] surveyor accompanied by [employer].  Verbalized understanding of discharge instructionsand recommended follow up care as per the after visit summary.  Written discharge instructions provided. Denies any further questions. PIV's removed. Drain teaching, [employer], and all other discharge teaching completed to both patient and family, verbalized understanding, teachback method utilized, denies any further questions. All belongings back to patient. Safety maintained throughout.Vital signs    Vitals:  11/19/22 1954 11/19/22 2204 11/20/22 0437 11/20/22 0759 BP: 132/79 (!) 143/73 139/84 138/85 Pulse: 87 69 60 72 Resp: 18 18 18 20  Temp: 97.3 ?F (36.3 ?C) 97.5 ?F (36.4 ?C) 98 ?F (36.7 ?C) 97.5 ?F (36.4 ?C) TempSrc: Oral Oral Oral Oral SpO2: 95% 96% 97% 96% Weight:     Height:     Patient confirmed all belongings returned. Belongings charted in last 7 days: Patient Valuables   Patient Valuables Flowsheet                    PATIENT VALUABLE(S)       Clothing Disposition Remained with parent/guardian  husband has belongings 11/17/22 1745  Vision Disposition Remained with parent/guardian  remained with husband 11/17/22 [phone]

## 2022-11-20 NOTE — Progress Notes
PLASTIC AND RECONSTRUCTIVE SURGERY FLAP CHECK NOTES/P L SSM, DIEP free flap and R breast reduction/mastopexy on 11/17/2022 Patient feels well. Ambulating, voiding, tolerating po without nausea/vomiting.  L breast: Soft without underlying fluid collection. Flap warm with <3 sec cap refill. Strong, arterial doppler with handheld doppler. No drainage from incisions. No ecchymosis of skin paddle. JP drains to suction w/ ss output. Vioptix at 72*% with good signal.  R breast: Incisions CDI. Dressing in place without strikethrough. JP drain in place draining ss. Bilateral abdominal incision dressing clean, dry, intact. JP drains x 2 with serosanguinous output. Continue current plan, will be d/c home tomorrow. Piedad Climes, MD (PGY-1)Section of Plastic and Reconstructive SurgeryDepartment of [facility] HospitalConsult Pager: [phone] Pager: [phone]

## 2022-11-20 NOTE — Discharge Instructions
Wound carePlease keep bra on at all times; it is ok to remove bra only for shower or to wash bra. You should sleep with bra on.Dressings to your right breast are to remain in place until you are seen in follow up with Dr. Cedric Fishman. You may shower over this dressing.Breast flap(s): Monitor flap for changes in color, temperature or appearance, and call office with any changes or concerns.Breast, abdominal, and umbilical (belly button) incisions: Keep incisions clean and dry. Gently clean with normal saline, gently pat dry. Leave incisions open to air, or if there is drainage from the incisions, cover with dry gauze and change daily or sooner as needed. You may gently clean umbilicus with cotton tipped swab.It is normal for bruising and swelling to get worse 2-3 days after surgery before it gets better (which takes weeks to resolve). Drain careEmpty and record all drain output daily, bring a record of the output amount with you to your follow up appointment. Milk/strip the drain tubing three times daily. Cover drain exit sites with a dry gauze if there is drainage from around the drain. It is normal for pink drainage to come from your drain sites. Activities Continue the same limited activity as in the hospital: No pushing off with upper extremities (use leg muscles to stand), no putting weight on arms. Please keep in mind gentle transitions when moving from the bed to a chair or when walking.Try to be out of bed during the day, except for a short nap or rest period. Please continue to sleep on your back. Do not sleep on your side or stomach. No pressure to breasts.You may shower daily, however do NOT let the shower hit your drains directly, rather allow warm water and mild soap to run over the incisions. Rinse with clean water. Secure the drains with a shoelace (or something equivalent) so the drains won't fall out during your shower. Keep the drain site covered with tegaderm at all times. Avoid Avoid ice on or near your breast.No driving while taking narcotic pain medication or muscle relaxer. Avoid strenuous activity; no pushing, pulling, no heavy lifting greater than 10lbs, no straining the abdomen. No laying flat (keep knees bent and HOB elevated) and do not lay on your side until cleared by your doctor.  Diet Resume your regular diet.Eat small frequent meals, especially when taking narcotic pain medicine as it can cause nausea on an empty stomach. It is important to stay hydrated and continue to drink copious amounts of fluid as this helps prevent dehydration and helps keep the flap well perfused. If you do not have an appetite, drink Ensure or a protein shake three times a day, so you get all the vitamins that you need.Meds You should resume your medications as detailed below. You should take an over the counter stool softener and may need a laxative while taking narcotic medications. You may take your anti-nausea medication as needed. You may take your prescribed pain medications with acetaminophen (Tylenol) or ibuprofen (Advil or Motrin). You should take plain acetaminophen and ibuprofen on a schedule, and reserve the narcotic medication for moderate to severe pain. The daily maximum limit of acetaminophen for adults is 3000mg , the daily maximum limit of ibuprofen is 2400mg . Call If you notice breast pain, sudden increase in swelling or change in the color of the flap, please call the office immediately as this could indicate a problem with the flap! Call the office anytime at [phone] your doctor if you notice cloudy or gray  discharge from your wounds, red streaks on your skin, a fever >101*F, brisk bleeding, or pain note relieved by medication prescribed. Call with any sudden increase in the JP drain output amount.Please call the office to schedule a follow up with plastic surgery team in 1 week.

## 2022-11-21 ENCOUNTER — Encounter: Admit: 2022-11-21 | Payer: PRIVATE HEALTH INSURANCE | Primary: Family

## 2022-11-23 ENCOUNTER — Encounter: Admit: 2022-11-23 | Payer: PRIVATE HEALTH INSURANCE | Primary: Family

## 2022-11-23 NOTE — Progress Notes
Post Discharge Outreach: Transition of Care Note Care Navigation spoke with: PatientDischarging Hospital: [facility] Admission Date: 7/30/2024Hospital Discharge Date: 11/20/2022    Diagnosis: Principal Diagnosis: Left breast DCISDischarge location: Home with ServicesRisk of Unplanned Readmission: 12.31%HOSPITALIZATION:Reason patient admitted: 52 y.o. female with a history of DCIS now s/p L breast SSM, L DIEP, R breast reduction/mastopexy on 11/17/2022. The patient went to the operating room on 11/17/2022 for planned surgery by Drs. Haykal and Addagatla.Diagnosis at discharge: Principal Diagnosis: Left breast DCISCURRENT STATE:Since discharge patient reports feeling: Better Patient cared for by: Family . Pt states JP drains are functioning and she has not seen any s/sx of infection.REVIEW OF AFTER VISIT SUMMARY DOCUMENT:Patient specific questions regarding discharge: Confirmed with pt that she rec'd printed copy of d/c instructions. MEDICATION CHANGES:Validated NEW medications to take: YesValidated Changed medications to take: N/AValidated Stopped medications to NOT take: N/AIssues obtaining prescriptions: NoAdditional medication related notes: Pt reports pain is well managed on Tramadol. Confirmed she is taking lovenox and duricef as ordered. Denied any additional questions/concerns at this time.FOLLOW-UP APPOINTMENTS and TRANSPORTATION:Patient aware of scheduled appointments: YesAwareness and assistance with appointments needing to be scheduled: N/ATransportation concerns for follow-up appointment: NoDME and HOME HEALTH SERVICES:Durable medical equipment received: N/AContact has been made with home care agency: Lillette Boxer established for follow up labs/tests: N/AOther follow up items: JP Drains - pt independent with care, [facility] was out to see pt yesterday and assess, pt monitoring output and recording.ADDITIONAL PATIENT NEEDS:Identified Social Determinants of Health opportunities: None  Additional patient needs addressed: Confirmed pt has clinic contact info. Denies any additional needs at this time.

## 2022-11-24 ENCOUNTER — Telehealth: Admit: 2022-11-24 | Payer: PRIVATE HEALTH INSURANCE | Attending: Plastic Surgery | Primary: Family

## 2022-11-24 NOTE — Telephone Encounter
Awaiting photos

## 2022-11-24 NOTE — Telephone Encounter
Pt c/o drain output color. s/p L breast SSM, L DIEP, R breast reduction/mastopexy on 11/17/2022. Pt states drain output is dark red. Pt does not have thermometer but does feel a little warmer than usual. She report having a headache all day that has not subsided with pain medication. L breast is not warm to the touch. Pt denies any increase in swelling.-pls call the pt back

## 2022-11-24 NOTE — Telephone Encounter
Spoke with pt. Advised to take 1000mg  tylenol q6 hours and tramadol as needed. She endorses the headache improved a bit with tylenol. She said she has only had about 32oz of water today. Encouraged her to drink more water and reviewed appropriate activity levels. Advised dark blood likely due to an old collection of blood and reviewed bright red blood would be concerning. She was appreciative of call and will see me on Friday as scheduled. [facility] coming to see her tomorrow.Lenice Llamas, APRNPlastic and Reconstructive [phone]

## 2022-11-25 NOTE — Telephone Encounter
Copied from CRM #[id number]. Topic: General Message - [facility]>> Nov 24, 2022  2:18 PM Shanda Bumps A wrote:[facility] CENTER MESSAGETime of call:   2:18 PMCaller:   [name], [name] for call:   patient is having an issue with her drain, darker than normal - very dark red, almost black, patient is also experiencing a headacheBest telephone number for callback:   [phone] Referral Specialist

## 2022-11-27 ENCOUNTER — Encounter: Admit: 2022-11-27 | Payer: PRIVATE HEALTH INSURANCE | Attending: Cardiovascular Disease | Primary: Family

## 2022-11-27 ENCOUNTER — Ambulatory Visit: Admit: 2022-11-27 | Payer: MEDICAID | Attending: Cardiovascular Disease | Primary: Family

## 2022-11-27 DIAGNOSIS — C73 Malignant neoplasm of thyroid gland: Secondary | ICD-10-CM

## 2022-11-27 DIAGNOSIS — T753XXA Motion sickness, initial encounter: Secondary | ICD-10-CM

## 2022-11-27 DIAGNOSIS — C50919 Malignant neoplasm of unspecified site of unspecified female breast: Secondary | ICD-10-CM

## 2022-11-27 DIAGNOSIS — N62 Hypertrophy of breast: Secondary | ICD-10-CM

## 2022-11-27 DIAGNOSIS — Z9011 Acquired absence of right breast and nipple: Secondary | ICD-10-CM

## 2022-11-27 NOTE — Progress Notes
POST OPERATIVE VISIT:Erika Reid12-22-1972Post-operative visitPhysician: Dr. Radene Journey OF PRESENT ILLNESS:Erika Khan is a 52 y.o. patient who presents for 1 week post-operative examination s/p left breast mastectomy with DIEP, SLNB, and right breast reduction on 11/17/22. She denies any significant complaints and pain has been well controlled on an oral pain medicine regimen with tylenol and motrin, no longer requiring tramadol. She has 2 left breast drains and 2 bilateral abd drains and has been maintaining them and recording their output.  Her right abd drain and medial left breast drain have been draining <20cc for the past two days. She is able to perform activities of daily living with minimal effort. Patient endorses sensation to right NAC. Patient denies any recent, fevers, chills, nausea/vomiting, or headache. Patient is tolerating a regular diet and is bathing as normal. Patient is pleased with their surgical result. Patient presents with her husband.FOCUSED EXAM:BP (!) 157/103  - Pulse 65  - Temp 97.9 ?F (36.6 ?C) (Temporal)  - Resp 18  - Wt 90.3 kg  - SpO2 99%  - BMI 31.17 kg/m? Physical ExamGeneral: well appearing female in NAD.Neuro: alert, awake, appropriate; no gross deficit.Breast: Good contour and symmetry, soft and appropriately tender throughout. Incisions are healing well. There is no evidence of hematoma, seroma, or infection. Left breast: both drains (all serosanguinous) and sutures are intact. Mastectomy flap is warm and well perfused. There is  mild resolving ecchymosis. Right breast with steristrips maintained. Right NAC viable and sensateAbdomen: Good contour and symmetry. Soft and nontender. Incisions healing well. No evidence of hematoma, seroma or infection. Bilateral drains and sutures intact with SS fluid. Umbilicus is viable. Specimen Weights: Mastectomy weights:Left: 876 gRight: 220 g Abdominal flap: 730 g Patient exam or treatment required medical chaperone.The sensitive parts of the examination were performed with chaperone present: Yes; Chaperone Name, Role/Title: Elina Tannebaum APRN.ASSESSMENT AND PLAN:Erika Khan is a 52 y.o. patient who presents for 1 week post-operative examination s/p left breast mastectomy with DIEP, SLNB, and right breast reduction on 11/17/22. Today we removed patient's right abdominal drain and left medial breast drain with ease. We stripped and left the remaining drains intact. The steristrips on right breast were clean and also left intact. The patient should continue their current wound care regimen and prescribed medications. I gave explicit instructions regarding signs and symptoms of infection and the patient knows to call with any questions or problems.  The patient was given the following instructions:- tylenol/ibuprofin PRN- complete lovenox and course of antibiotics as prescribed- no submerging in water until incisions fully healed- no restrictions with ROM - ok to go against tightness, but not against pain- ok to wash hair, no reaching to high shelves until 3 weeks post-op- no strenuous activity until 4 weeks post-op. Can increase post-op activities weeks 4-6. No restrictions once 6 weeks post-op. - No under-wire bra for 2 months post-op. Continue to wear bra at night until 2 weeks post-op.- continue to sleep on back and reclined until 4 weeks post-op - ok to sleep on right side once 2 weeks post-op- continue to monitor drain output- ok to straighten as tolerated - follow up with me in 1 week for possible drain removal or sooner if any questions or concernsJosephine Shanan Fitzpatrick, PA

## 2022-11-30 ENCOUNTER — Ambulatory Visit: Admit: 2022-11-30 | Payer: MEDICAID | Attending: Complex General Surgical Oncology | Primary: Family

## 2022-11-30 DIAGNOSIS — D0512 Intraductal carcinoma in situ of left breast: Secondary | ICD-10-CM

## 2022-12-01 NOTE — Progress Notes
Division of Surgical OncologyFollow Up REFERRING PHYSICIAN:Referring, NoNo address on fileNo address on fileHISTORY OF PRESENT Erika Khan is a lovely 52 y.o. female who presents for follow-up.  She underwent a left breast mastectomy, left sentinel lymph node biopsy and DIEP flap reconstruction and right breast reduction with Dr. 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  She is doing well today.   She denies fever, chills, chest pain or shortness of breath. Her pain is well controlled.MEDICATIONS:Current Outpatient Medications Medication Sig Dispense Refill  acetaminophen (TYLENOL) 325 mg tablet Take 2 tablets (650 mg total) by mouth every 6 (six) hours. 30 tablet 0  dilTIAZem CD (CARDIZEM CD) 120 mg 24 hr capsule TAKE 1 CAPSULE (120 MG [name]) BY MOUTH IN THE MORNING    enoxaparin (LOVENOX) 40 mg/0.4 mL syringe Inject 0.4 mLs (40 mg total) under the skin daily. 12 mL 0  levothyroxine (SYNTHROID) 137 MCG tablet Take 1 tablet (137 mcg total) by mouth.    polyethylene glycol (MIRALAX) 17 gram packet Take 1 packet (17 g total) by mouth nightly as needed for constipation (Constipation). Mix in 8 ounces of water, juice, soda, coffee or tea prior to taking.   No current facility-administered medications for this visit. PHYSICAL EXAMINATION:Vitals:  11/30/22 1038 BP: 136/83 Pulse: 79 Resp: 18 Temp: 97.4 ?F (36.3 ?C) General: This is a well appearing female with appropriate speech, affect and gait.Left breast has been surgically removed and reconstruction with autologous tissue - incisions are healing wellNo signs of infectionDrains SSPathology: SURGICAL PATHOLOGY REPORT                                               Procedures/Addenda AttachedPatientKEIARAH, Khan                  MR #: [id number] Accession #: [id number]        Submitted by: Burnard Leigh RFINAL DIAGNOSIS1. BREAST, LEFT, MASTECTOMY:            - DUCTAL CARCINOMA IN SITU, GRADE 2-3, 1.8 CM, NEGATIVE (>2 MM TO) RESECTION MARGINS     - SURGERY RELATED CHANGES AND BIOPSY SITE CHANGES     - PATHOLOGIC STAGE (AJCC 8TH ED): pTis N0(sn)     - SEE SYNOPTIC REPORT2.  BREAST, LEFT, INFERIOR FLAP MARGIN, EXCISION:       - BENIGN FIBROADIPOSE TISSUE3.  SENTINEL LYMPH NODES, LEFT, EXCISION:       - TWO LYMPH NODES NEGATIVE FOR METASTATIC CARCINOMA (0/2)4.  BREAST TISSUE, RIGHT, EXCISION:       - BENIGN FIBROADIPOSE TISSUE AND SKIN5.  SKIN, LEFT BREAST, EXCISION:       - BENIGN SKINSYNOPTIC SUMMARYMALIGNANT NEOPLASM OF THE BREAST     Procedure:     Total mastectomyLaterality:     LeftLymph Node Sampling:     Sentinel lymph node biopsyLymph Nodes     Lymph Nodes Examined (Sentinel):     2   Lymph Nodes Involved (Sentinel):     0Lymph Nodes Examined (Total):     2Lymph Nodes Involved (Total):     0   LN Isolated Cells (<0.70mm):     0Invasive Component     Invasive Tumor Type:     NO INVASIVE CARCINOMA IDENTIFIEDIntraepithelial (in situ) Component     In Situ Tumor Type:     Ductal carcinoma in  situ (DCIS)Architectural Pattern(s)-DCIS:     SolidTumor Size (in situ):     1.8 cmNuclear Grade:     Grade II (intermediate)       Grade III (high)Necrosis:     Present, central (expansive comedo necrosis)Calcifications:     Present, abundantTumor Extension (Nipple):     Lactiferous ducts involvedSurgical Margins (In situ):     Not involvedClosest Margin (In situ):     All margins >0.2cmStaging     Stage (AJCC 8th Ed):     pTis(DCIS) N0(sn)Predictive/Prognostic Markers     Performed on:     Current specimenEstrogen receptor (ER) status:     Positive (>10%)   % Positivity (ER):     90 %   Intensity (ER):     3+Progesterone receptor (PR) status:     Positive (>10%)   % Positivity (PR):     30 %   Intensity (PR): 2-3+NOTE: The immunostains for SMM and multicomplex ADH5 reveal intact myoepithelial cells around the in-situ tumor. No definite invasive tumor is identified.    Pathologist: Stacy Gardner, M.D.   11/25/2022 15:12      * Report Electronically Signed Out *                        Assessment/PlanShawn Hornbaker is a lovely 52 y.o. female who  is doing well post-operatively.  We discussed her post-operative care including incision care, okay to return to normal activities, and concerning signs and symptoms.  10 minutes of the visit were devoted to post-operative care.We then reviewed her pathology extensively.  We discussed that she does not need any additional adjuvant treatment for her left breast DCIS.  Will plan to see patient in 6 months for clinical exam and imaging of her right breast will be done in 12 months.  Encouraged patient to call with any additional questions or concerns prior to visit if they arise.Moody Bruins, MD MSAssistant Professor of [facility] Medical [facility], [facility]

## 2022-12-03 ENCOUNTER — Telehealth: Admit: 2022-12-03 | Payer: PRIVATE HEALTH INSURANCE | Attending: Plastic Surgery | Primary: Family

## 2022-12-03 ENCOUNTER — Encounter: Admit: 2022-12-03 | Payer: PRIVATE HEALTH INSURANCE | Attending: Complex General Surgical Oncology | Primary: Family

## 2022-12-04 ENCOUNTER — Ambulatory Visit: Admit: 2022-12-04 | Payer: MEDICAID | Primary: Family

## 2022-12-07 ENCOUNTER — Encounter: Admit: 2022-12-07 | Payer: PRIVATE HEALTH INSURANCE | Attending: Complex General Surgical Oncology | Primary: Family

## 2022-12-07 NOTE — Telephone Encounter
Copied from CRM #[id number]. Topic: General Message - [facility]>> Dec 03, 2022 12:42 PM Guadlupe Spanish wrote:[facility] CENTER MESSAGETime of call:   12:42 PMCaller:   ADRI, MELCHOR for call:   patient would like to clarify what her apt is for tomorrow - if it was for drain removal, it was removed on Monday by Dr Luster Landsberg. She wants to know if it is still necessary to come. Also would like to know when she is going to be seeing Dr Cedric Fishman ? Best telephone number for callback:   [phone] Referral Specialist

## 2022-12-09 ENCOUNTER — Encounter: Admit: 2022-12-09 | Payer: PRIVATE HEALTH INSURANCE | Attending: Complex General Surgical Oncology | Primary: Family

## 2022-12-09 ENCOUNTER — Encounter: Admit: 2022-12-09 | Payer: PRIVATE HEALTH INSURANCE | Attending: Oncology | Primary: Family

## 2022-12-11 ENCOUNTER — Telehealth: Admit: 2022-12-11 | Payer: PRIVATE HEALTH INSURANCE | Attending: Complex General Surgical Oncology | Primary: Family

## 2022-12-11 NOTE — Telephone Encounter
Alecia calling to get confirmation of medical letter to approve patient for financial assistance. CB# [phone]

## 2022-12-11 NOTE — Telephone Encounter
Called Alecia back at [employer]. She wanted to clarify letter was sent from Dr Luster Landsberg office asking for assistance. Verified Dr Luster Landsberg sent lette.

## 2022-12-18 ENCOUNTER — Encounter: Admit: 2022-12-18 | Payer: PRIVATE HEALTH INSURANCE | Attending: Plastic Surgery | Primary: Family

## 2022-12-18 ENCOUNTER — Ambulatory Visit: Admit: 2022-12-18 | Payer: MEDICAID | Attending: Plastic Surgery | Primary: Family

## 2022-12-18 DIAGNOSIS — N62 Hypertrophy of breast: Secondary | ICD-10-CM

## 2022-12-18 DIAGNOSIS — Z9011 Acquired absence of right breast and nipple: Secondary | ICD-10-CM

## 2022-12-18 DIAGNOSIS — C73 Malignant neoplasm of thyroid gland: Secondary | ICD-10-CM

## 2022-12-18 DIAGNOSIS — C50919 Malignant neoplasm of unspecified site of unspecified female breast: Secondary | ICD-10-CM

## 2022-12-18 DIAGNOSIS — T753XXA Motion sickness, initial encounter: Secondary | ICD-10-CM

## 2022-12-18 NOTE — Progress Notes
PLASTIC AND RECONSTRUCTIVE SURGERY PROGRESS NOTESubjective:  Erika Khan is a lovely 52 y.o. female who presents for follow-up s/p left breast mastectomy, left sentinel lymph node biopsy and DIEP flap reconstruction and right breast reduction with on 11/17/22. She is doing well today.  Her pain is well-controlled, she is not using any oral pain medications. Her drains were removed two weeks ago.       Review of SystemsA 10-point review of systems was completed and negative.   Objective: Vitals: Vitals:  12/18/22 1700 BP: 136/86 Pulse: 90 Resp: 16 Temp: 97.2 ?F (36.2 ?C) Physical Exam:NAD, AAOx3Unlabored respirations on RALeft breast: Flap warm and well-perfused. Incisions healing well and well-approximated without drainage. Right breast: Incisions clean, dry, and intact. Healing well. No erythema, underlying fluid collections, or concern for infection.    Assessment / Plan:  52 y.o. female with a history of left breast mastectomy, left sentinel lymph node biopsy and DIEP flap reconstruction and right breast reduction  on 11/17/22. She is recovering appropriately without complications. Counseled on the importance of limiting activities that activate the pectoralis or abdominal wall muscles to improve healing. -incisions well-healed-continue breast massage -counseled on limiting activities until next postoperative visit-continue wearing surgical bra, no underwire bras -return to clinic in 4 weeks to be cleared for increased activity  Electronically Signed by Jerral Bonito, MD, December 18, 2022

## 2022-12-24 NOTE — Progress Notes
PLASTIC AND RECONSTRUCTIVE SURGERY PROGRESS NOTESubjective:  Erika Khan is a lovely 52 y.o. female who presents for follow-up s/p left breast mastectomy, left sentinel lymph node biopsy and DIEP flap reconstruction and right breast reduction with on 11/17/22. She is doing well today.  Her pain is well-controlled, she is not using any oral pain medications. Her drains were removed two weeks ago.       Review of SystemsA 10-point review of systems was completed and negative.   Objective: Vitals: Vitals:  12/18/22 1700 BP: 136/86 Pulse: 90 Resp: 16 Temp: 97.2 ?F (36.2 ?C) Physical Exam:NAD, AAOx3Unlabored respirations on RALeft breast: Flap warm and well-perfused. Incisions healing well and well-approximated without drainage. Right breast: Incisions clean, dry, and intact. Healing well. No erythema, underlying fluid collections, or concern for infection.    Assessment / Plan:  52 y.o. female with a history of left breast mastectomy, left sentinel lymph node biopsy and DIEP flap reconstruction and right breast reduction  on 11/17/22. She is recovering appropriately without complications. Counseled on the importance of limiting activities that activate the pectoralis or abdominal wall muscles to improve healing. -incisions well-healed-continue breast massage -counseled on limiting activities until next postoperative visit-continue wearing surgical bra, no underwire bras -return to clinic in 4 weeks to be cleared for increased activity

## 2023-01-15 ENCOUNTER — Ambulatory Visit: Admit: 2023-01-15 | Payer: MEDICAID | Attending: Plastic Surgery | Primary: Family

## 2023-01-22 ENCOUNTER — Encounter: Admit: 2023-01-22 | Payer: PRIVATE HEALTH INSURANCE | Attending: Plastic Surgery | Primary: Family

## 2023-01-22 ENCOUNTER — Ambulatory Visit: Admit: 2023-01-22 | Payer: MEDICAID | Attending: Plastic Surgery | Primary: Family

## 2023-01-22 DIAGNOSIS — C50919 Malignant neoplasm of unspecified site of unspecified female breast: Secondary | ICD-10-CM

## 2023-01-22 DIAGNOSIS — Z9011 Acquired absence of right breast and nipple: Secondary | ICD-10-CM

## 2023-01-22 DIAGNOSIS — T753XXA Motion sickness, initial encounter: Secondary | ICD-10-CM

## 2023-01-22 DIAGNOSIS — C73 Malignant neoplasm of thyroid gland: Secondary | ICD-10-CM

## 2023-01-29 NOTE — Progress Notes
 PLASTIC AND RECONSTRUCTIVE SURGERY CONSULT NOTE Consult InformationReason for consultation: Postop Follow up PRS Attending: Dr. Kathie Rhodes. HaykalHistory of Present Erika Khan presented today for a follow up after immediate autologous left breast reconstruction with DIEP flap and right breast reduction on 11/17/2022 by Dr. Rosary Lively patient is healthy-appearing, alert and oriented. She denies any pain.The scars are well-healed, still active, the abdominal scar slightly indurated. Abdominally she presents with lateral tissue excess at the DIEP flap harvest site, while there is slight breast asymmetry - relative slight deficit of the right breast mound. NAC of rest breast not yet reconstructed. RecommendationsWe discussed the possibilities for surgical correction of the lateral tissue excess in the lower abdomen with right breast mound revision with fat grafting, liposuction bilateral flanks, abdominal scar revision with excision of lateral abdominal tissue excess, left nipple reconstruction with CV flaps. The procedures, expected outcomes as well as associated risks and benefits were discussed with the patient in great detail today. Erika Khan agreed to proceed as suggested and gave her written consent.Discussed with attending Dr. Azzie Roup and Reconstructive Surgery Consult Contact [facility] 24/7: [phone] [facility] BMWUXLKG [phone]- 5p): [phone], [phone] (5p-7a) and weekends: [phone] new facial and hand trauma consults, please check Amion to determine the appropriate team on call for the week.

## 2023-02-05 ENCOUNTER — Ambulatory Visit: Admit: 2023-02-05 | Payer: MEDICAID | Attending: Plastic Surgery | Primary: Family

## 2023-03-04 ENCOUNTER — Encounter: Admit: 2023-03-04 | Payer: PRIVATE HEALTH INSURANCE | Attending: Plastic Surgery | Primary: Family

## 2023-03-08 ENCOUNTER — Encounter: Admit: 2023-03-08 | Payer: PRIVATE HEALTH INSURANCE | Attending: Complex General Surgical Oncology | Primary: Family

## 2023-03-08 ENCOUNTER — Encounter: Admit: 2023-03-08 | Payer: PRIVATE HEALTH INSURANCE | Attending: Plastic Surgery | Primary: Family

## 2023-03-22 ENCOUNTER — Encounter: Admit: 2023-03-22 | Payer: PRIVATE HEALTH INSURANCE | Attending: Complex General Surgical Oncology | Primary: Family

## 2023-03-24 ENCOUNTER — Telehealth: Admit: 2023-03-24 | Payer: PRIVATE HEALTH INSURANCE | Primary: Family

## 2023-03-24 ENCOUNTER — Encounter: Admit: 2023-03-24 | Payer: PRIVATE HEALTH INSURANCE | Attending: Plastic Surgery | Primary: Family

## 2023-03-24 NOTE — Telephone Encounter
 Called patient and explained that she needed to contact medical records to obtain the full list of medications administered during her surgery and that she can forward that list to the appropriate people regarding her drug testing at work. She verbalizes understanding.

## 2023-05-21 ENCOUNTER — Encounter: Admit: 2023-05-21 | Payer: PRIVATE HEALTH INSURANCE | Attending: Cardiovascular Disease | Primary: Family

## 2023-05-21 MED ORDER — TRAMADOL 50 MG TABLET
50 | ORAL_TABLET | Freq: Four times a day (QID) | ORAL | 1 refills | Status: AC | PRN
Start: 2023-05-21 — End: ?

## 2023-05-24 ENCOUNTER — Encounter: Admit: 2023-05-24 | Payer: PRIVATE HEALTH INSURANCE | Attending: Plastic Surgery | Primary: Family

## 2023-05-24 DIAGNOSIS — T753XXA Motion sickness, initial encounter: Secondary | ICD-10-CM

## 2023-05-24 DIAGNOSIS — I10 Essential (primary) hypertension: Secondary | ICD-10-CM

## 2023-05-24 DIAGNOSIS — C50919 Malignant neoplasm of unspecified site of unspecified female breast: Secondary | ICD-10-CM

## 2023-05-24 DIAGNOSIS — C73 Malignant neoplasm of thyroid gland: Secondary | ICD-10-CM

## 2023-05-24 DIAGNOSIS — E039 Hypothyroidism, unspecified: Secondary | ICD-10-CM

## 2023-05-26 ENCOUNTER — Ambulatory Visit: Admit: 2023-05-26 | Payer: MEDICAID | Attending: Anesthesiology | Primary: Family

## 2023-05-27 ENCOUNTER — Ambulatory Visit: Admit: 2023-05-27 | Payer: MEDICAID | Attending: Anesthesiology | Primary: Family

## 2023-05-27 ENCOUNTER — Ambulatory Visit: Admit: 2023-05-27 | Payer: MEDICAID | Primary: Family

## 2023-05-27 ENCOUNTER — Inpatient Hospital Stay: Admit: 2023-05-27 | Discharge: 2023-05-27 | Payer: MEDICAID | Attending: Plastic Surgery | Primary: Family

## 2023-05-27 DIAGNOSIS — E669 Obesity, unspecified: Secondary | ICD-10-CM

## 2023-05-27 DIAGNOSIS — Z9012 Acquired absence of left breast and nipple: Secondary | ICD-10-CM

## 2023-05-27 DIAGNOSIS — Z79899 Other long term (current) drug therapy: Secondary | ICD-10-CM

## 2023-05-27 DIAGNOSIS — Z6831 Body mass index (BMI) 31.0-31.9, adult: Secondary | ICD-10-CM

## 2023-05-27 DIAGNOSIS — Z411 Encounter for cosmetic surgery: Secondary | ICD-10-CM

## 2023-05-27 DIAGNOSIS — E039 Hypothyroidism, unspecified: Secondary | ICD-10-CM

## 2023-05-27 DIAGNOSIS — I10 Essential (primary) hypertension: Secondary | ICD-10-CM

## 2023-05-27 DIAGNOSIS — Z79890 Hormone replacement therapy (postmenopausal): Secondary | ICD-10-CM

## 2023-05-27 DIAGNOSIS — Z853 Personal history of malignant neoplasm of breast: Secondary | ICD-10-CM

## 2023-05-27 MED ORDER — PROPOFOL 10 MG/ML INTRAVENOUS EMULSION
10 | INTRAVENOUS | Status: DC | PRN
Start: 2023-05-27 — End: 2023-05-27
  Administered 2023-05-27: 13:00:00 10 mg/mL via INTRAVENOUS

## 2023-05-27 MED ORDER — CHLORHEXIDINE GLUCONATE 2 % TOWELETTE
2 | Freq: Two times a day (BID) | TOPICAL | Status: DC
Start: 2023-05-27 — End: 2023-05-28

## 2023-05-27 MED ORDER — DEXMEDETOMIDINE INFUSION FOR ANESTHESIA
INTRAVENOUS | Status: DC | PRN
Start: 2023-05-27 — End: 2023-05-27
  Administered 2023-05-27: 13:00:00 100.000 mL/h via INTRAVENOUS

## 2023-05-27 MED ORDER — ROCURONIUM 10 MG/ML INTRAVENOUS SOLUTION
10 | INTRAVENOUS | Status: DC | PRN
Start: 2023-05-27 — End: 2023-05-27
  Administered 2023-05-27: 13:00:00 10 mg/mL via INTRAVENOUS

## 2023-05-27 MED ORDER — SUGAMMADEX 100 MG/ML INTRAVENOUS SOLUTION
100 | Status: CP
Start: 2023-05-27 — End: ?

## 2023-05-27 MED ORDER — EPINEPHRINE 1 MG/ML (1 ML) INJECTION SOLUTION
11 | Status: DC | PRN
Start: 2023-05-27 — End: 2023-05-27
  Administered 2023-05-27: 14:00:00 11 mg/mL ( mL)

## 2023-05-27 MED ORDER — DIMENHYDRINATE 5 MG/ML IN 0.9% SODIUM CHLORIDE
INTRAVENOUS | Status: DC | PRN
Start: 2023-05-27 — End: 2023-05-28

## 2023-05-27 MED ORDER — SODIUM CHLORIDE 0.9 % (FLUSH) INJECTION SYRINGE
0.9 | INTRAVENOUS | Status: DC | PRN
Start: 2023-05-27 — End: 2023-05-27

## 2023-05-27 MED ORDER — CEFAZOLIN 1 GRAM SOLUTION FOR INJECTION
1 | Status: CP
Start: 2023-05-27 — End: ?

## 2023-05-27 MED ORDER — SUGAMMADEX 100 MG/ML INTRAVENOUS SOLUTION
100 | INTRAVENOUS | Status: DC | PRN
Start: 2023-05-27 — End: 2023-05-27
  Administered 2023-05-27: 16:00:00 100 mg/mL via INTRAVENOUS

## 2023-05-27 MED ORDER — ACETAMINOPHEN 1,000 MG/100 ML (10 MG/ML) INTRAVENOUS SOLUTION
10 | Status: CP
Start: 2023-05-27 — End: ?

## 2023-05-27 MED ORDER — HYDROMORPHONE 2 MG/ML INJECTION SOLUTION
2 | INTRAVENOUS | Status: DC | PRN
Start: 2023-05-27 — End: 2023-05-27
  Administered 2023-05-27 (×4): 2 mg/mL via INTRAVENOUS

## 2023-05-27 MED ORDER — FENTANYL (PF) 50 MCG/ML INJECTION SOLUTION
50 | INTRAVENOUS | Status: DC | PRN
Start: 2023-05-27 — End: 2023-05-27

## 2023-05-27 MED ORDER — MIDAZOLAM 1 MG/ML INJECTION SOLUTION
1 | Status: CP
Start: 2023-05-27 — End: ?

## 2023-05-27 MED ORDER — TRAMADOL 50 MG TABLET
50 | Freq: Once | ORAL | Status: CP
Start: 2023-05-27 — End: ?
  Administered 2023-05-27: 18:00:00 50 mg via ORAL

## 2023-05-27 MED ORDER — HYDROMORPHONE 0.5 MG/0.5 ML INJECTION SYRINGE
0.50.5 | INTRAVENOUS | Status: DC | PRN
Start: 2023-05-27 — End: 2023-05-28

## 2023-05-27 MED ORDER — FAMOTIDINE (PF) 20 MG/2 ML INTRAVENOUS SOLUTION
202 | INTRAVENOUS | Status: DC | PRN
Start: 2023-05-27 — End: 2023-05-27
  Administered 2023-05-27: 14:00:00 202 mg/2 mL via INTRAVENOUS

## 2023-05-27 MED ORDER — HYDROMORPHONE (PF) 0.2 MG/ML INJECTION SYRINGE
0.2 | INTRAVENOUS | Status: DC | PRN
Start: 2023-05-27 — End: 2023-05-27

## 2023-05-27 MED ORDER — ACETAMINOPHEN 1,000 MG/100 ML (10 MG/ML) INTRAVENOUS SOLUTION
10 | INTRAVENOUS | Status: DC | PRN
Start: 2023-05-27 — End: 2023-05-27
  Administered 2023-05-27: 14:00:00 10 mg/mL via INTRAVENOUS

## 2023-05-27 MED ORDER — SODIUM CHLORIDE 0.9 % IRRIGATION SOLUTION
0.9 | Status: CP | PRN
Start: 2023-05-27 — End: ?
  Administered 2023-05-27: 14:00:00 0.9 % irrigation

## 2023-05-27 MED ORDER — FENTANYL (PF) 50 MCG/ML INJECTION SOLUTION
50 | Status: CP
Start: 2023-05-27 — End: ?

## 2023-05-27 MED ORDER — ONDANSETRON HCL (PF) 4 MG/2 ML INJECTION SOLUTION
42 | INTRAVENOUS | Status: DC | PRN
Start: 2023-05-27 — End: 2023-05-27
  Administered 2023-05-27: 14:00:00 42 mg/2 mL via INTRAVENOUS

## 2023-05-27 MED ORDER — LACTATED RINGERS INTRAVENOUS SOLUTION
Status: CP | PRN
Start: 2023-05-27 — End: ?
  Administered 2023-05-27: 14:00:00 via INTRAVENOUS

## 2023-05-27 MED ORDER — FENTANYL (PF) 50 MCG/ML INJECTION SOLUTION
50 | INTRAVENOUS | Status: DC | PRN
Start: 2023-05-27 — End: 2023-05-27
  Administered 2023-05-27: 13:00:00 50 mcg/mL via INTRAVENOUS

## 2023-05-27 MED ORDER — MIDAZOLAM 1 MG/ML INJECTION SOLUTION
1 | INTRAVENOUS | Status: DC | PRN
Start: 2023-05-27 — End: 2023-05-27
  Administered 2023-05-27: 13:00:00 1 mg/mL via INTRAVENOUS

## 2023-05-27 MED ORDER — EPINEPHRINE 1 MG/ML (1 ML) INJECTION SOLUTION
11 | Status: CP
Start: 2023-05-27 — End: ?

## 2023-05-27 MED ORDER — HYDROMORPHONE 2 MG/ML INJECTION SOLUTION
2 | Status: CP
Start: 2023-05-27 — End: ?

## 2023-05-27 MED ORDER — FENTANYL (PF) 50 MCG/ML INJECTION SOLUTION
50 | INTRAVENOUS | Status: DC | PRN
Start: 2023-05-27 — End: 2023-05-28

## 2023-05-27 MED ORDER — PROCHLORPERAZINE EDISYLATE 10 MG/2 ML (5 MG/ML) INJECTION SOLUTION
1025 | INTRAVENOUS | Status: DC | PRN
Start: 2023-05-27 — End: 2023-05-27

## 2023-05-27 MED ORDER — LACTATED RINGERS INTRAVENOUS SOLUTION
INTRAVENOUS | Status: DC | PRN
Start: 2023-05-27 — End: 2023-05-27
  Administered 2023-05-27: 13:00:00 via INTRAVENOUS

## 2023-05-27 MED ORDER — CEFAZOLIN 1 GRAM SOLUTION FOR INJECTION
1 | INTRAVENOUS | Status: DC | PRN
Start: 2023-05-27 — End: 2023-05-27
  Administered 2023-05-27: 13:00:00 1 gram via INTRAVENOUS

## 2023-05-27 MED ORDER — NALOXONE 0.4 MG/ML INJECTION SOLUTION
0.4 | INTRAVENOUS | Status: DC | PRN
Start: 2023-05-27 — End: 2023-05-27

## 2023-05-27 MED ORDER — NALOXONE 0.4 MG/ML INJECTION SOLUTION
0.4 | INTRAVENOUS | Status: DC | PRN
Start: 2023-05-27 — End: 2023-05-28

## 2023-05-27 MED ORDER — DIMENHYDRINATE 50 MG/ML INJECTION SOLUTION
50 | INTRAMUSCULAR | Status: DC | PRN
Start: 2023-05-27 — End: 2023-05-27
  Administered 2023-05-27: 14:00:00 50 mg/mL via INTRAMUSCULAR

## 2023-05-27 MED ORDER — SODIUM CHLORIDE 0.9 % (FLUSH) INJECTION SYRINGE
0.9 | Freq: Three times a day (TID) | INTRAVENOUS | Status: DC
Start: 2023-05-27 — End: 2023-05-27

## 2023-05-27 MED ORDER — PROPOFOL 10 MG/ML INTRAVENOUS EMULSION
10 | INTRAVENOUS | Status: DC | PRN
Start: 2023-05-27 — End: 2023-05-27
  Administered 2023-05-27: 13:00:00 10 mL/h via INTRAVENOUS

## 2023-05-27 MED ORDER — ONDANSETRON HCL (PF) 4 MG/2 ML INJECTION SOLUTION
42 | INTRAVENOUS | Status: DC | PRN
Start: 2023-05-27 — End: 2023-05-28

## 2023-05-27 MED ORDER — DEXAMETHASONE SODIUM PHOSPHATE (PF) 10 MG/ML INJECTION SOLUTION
10 | INTRAVENOUS | Status: DC | PRN
Start: 2023-05-27 — End: 2023-05-27
  Administered 2023-05-27: 14:00:00 10 mg/mL via INTRAVENOUS

## 2023-05-27 MED ORDER — SODIUM CHLORIDE 0.9 % (FLUSH) INJECTION SYRINGE
0.9 | Freq: Three times a day (TID) | INTRAVENOUS | Status: DC
Start: 2023-05-27 — End: 2023-05-28

## 2023-05-27 MED ORDER — CHLORHEXIDINE GLUCONATE 0.12 % MOUTHWASH
0.12 | Freq: Once | OROMUCOSAL | Status: CP
Start: 2023-05-27 — End: ?
  Administered 2023-05-27: 11:00:00 0.12 mL via OROMUCOSAL

## 2023-05-27 MED ORDER — SODIUM CHLORIDE 0.9 % (FLUSH) INJECTION SYRINGE
0.9 | INTRAVENOUS | Status: DC | PRN
Start: 2023-05-27 — End: 2023-05-28

## 2023-05-27 NOTE — Anesthesia Post-Procedure Evaluation
 Anesthesia Post-op NotePatient: Erika ReidProcedure(s):  Procedure(s) (LRB):RIGHT BREAST MOUND REVISION WITH FAT GRAFTING, LIPOSUCTION OF BILATERAL FLANKS,ABDOMINAL SCAR REVISION WITH EXCISION OF LATERAL ABSOMINAL TISSUE EXCESS, LEFT NIPPLE RECONSTRUCTION WITH CV FLAPS (Bilateral)PR GRAFTING OF AUTOLOGOUS FAT BY LIPO 50 CC OR LESS (Right)GRAFTING OF AUTOLOGOUS FAT BY LIPO EA ADDL 50 CC (Right)ADJACENT TISSUE TRANSFER/REARRANGEMENT, > 30 SQ CM, UNUSUAL/COMPLICATED, ANY AREA (Right)ADJ TISS XFER ANY AREA,EA ADD 30.0 SQCM (Right) Last Vitals:  I have reviewed the post-operative vital signs as noted in the Epic chart.POSTOP EVALUATION:      Patient Recovery Location:  PACU     Vital Signs Status:  Stable     Patient Participation:  Patient participated     Mental Status:  Awake     Respiratory Status:  Acceptable     Airway Patency:  Patent     Cardiovascular/Hydration Status:  Stable     Pain Management:  Satisfactory to patient     Nausea/Vomiting Status:  Satisfactory to patientThere were no known notable events for this encounter.

## 2023-05-27 NOTE — Other
 Pt Afebrile, VSS, cardiac, respiratory & skin WDL. Pt tolerating PO. D/C instructions reviewed w/ pt. Pt verbally stated understanding. All questions answered and needs met. PIV removed. Pt belonging returned to pt.

## 2023-05-27 NOTE — Other
 Operative Diagnosis:Pre-op:   Hx of breast cancer [Z85.3] Patient Coded Diagnosis   Pre-op diagnosis: Hx of breast cancer  Post-op diagnosis: Hx of breast cancer  Patient Diagnosis   Pre-op diagnosis: Hx of breast cancer [Z85.3]  Post-op diagnosis: Hx of breast cancer [342395]    Post-op diagnosis:   * Hx of breast cancer [place].Llano.[id number]]Operative Procedure(s) :Procedure(s) (LRB):RIGHT BREAST MOUND REVISION WITH FAT GRAFTING, LIPOSUCTION OF BILATERAL FLANKS,ABDOMINAL SCAR REVISION WITH EXCISION OF LATERAL ABSOMINAL TISSUE EXCESS, LEFT NIPPLE RECONSTRUCTION WITH CV FLAPS (Bilateral)PR GRAFTING OF AUTOLOGOUS FAT BY LIPO 50 CC OR LESS (Right)GRAFTING OF AUTOLOGOUS FAT BY LIPO EA ADDL 50 CC (Right)ADJACENT TISSUE TRANSFER/REARRANGEMENT, > 30 SQ CM, UNUSUAL/COMPLICATED, ANY AREA (Right)ADJ TISS XFER ANY AREA,EA ADD 30.0 SQCM (Right)Post-op Procedure & Diagnosis ConfirmationPost-op Diagnosis: Post-op Diagnosis confirmed (no changes)Post-op Procedure: Post-op Procedure confirmed (no changes)

## 2023-05-27 NOTE — Other
 Post Anesthesia Transfer of Care NotePatient: Erika ReidProcedure(s) Performed: Procedure(s) (LRB):RIGHT BREAST MOUND REVISION WITH FAT GRAFTING, LIPOSUCTION OF BILATERAL FLANKS,ABDOMINAL SCAR REVISION WITH EXCISION OF LATERAL ABSOMINAL TISSUE EXCESS, LEFT NIPPLE RECONSTRUCTION WITH CV FLAPS (Bilateral)PR GRAFTING OF AUTOLOGOUS FAT BY LIPO 50 CC OR LESS (Right)GRAFTING OF AUTOLOGOUS FAT BY LIPO EA ADDL 50 CC (Right)ADJACENT TISSUE TRANSFER/REARRANGEMENT, > 30 SQ CM, UNUSUAL/COMPLICATED, ANY AREA (Right)ADJ TISS XFER ANY AREA,EA ADD 30.0 SQCM (Right)Last Vitals: I have reviewed the post-operative vital signs during the handoff as noted in the Epic chart.POSTOP HANDOFF :      Patient Location:  PACU     Level of Consciousness:  Awake     VS stable since last recorded intra-op set? Yes       Oxygen source: maskPatient co-morbidities, intra-operative course, intake & output and antibiotics as per Anesthesia record were discussed with the RN.

## 2023-05-27 NOTE — Anesthesia Pre-Procedure Evaluation
 This is a 53 y.o. female scheduled for RIGHT BREAST MOUND REVISION WITH FAT GRAFTING, LIPOSUCTION OF BILATERAL FLANKS,ABDOMINAL SCAR REVISION WITH EXCISION OF LATERAL ABSOMINAL TISSUE EXCESS, LEFT NIPPLE RECONSTRUCTION WITH CV FLAPS (Bilateral)EXCISION, BREAST LESION(S), OPEN, FEMALE/FEMALE; 1 OR MORE (Right)PR GRAFTING OF AUTOLOGOUS FAT BY LIPO 50 CC OR LESS (Right)GRAFTING OF AUTOLOGOUS FAT BY LIPO EA ADDL 50 CC (Right)ADJACENT TISSUE TRANSFER/REARRANGEMENT, > 30 SQ CM, UNUSUAL/COMPLICATED, ANY AREA (Right)ADJ TISS XFER ANY AREA,EA ADD 30.0 SQCM (Right).Review of Systems/ Medical History Patient summary, nursing notes, EKG/Cardiac Studies , Labs, pre-procedure vitals, height, weight and NPO status reviewed.No previous anesthesia concernsAnesthesia Evaluation:   No history of anesthetic complications  Perioperative Screening ToolPONV Screening risk factors: non-smoker, motion sickness, female and surgery >60 minutes.Estimated body mass index.05/27/23 : 31.47 kg/m? Last patient weight recorded. 05/27/23 : 88.5 kg Last patient height recorded. 05/27/23 : 5' 6 (1.676 m) CC/HPI: 53 yr old female pt scheduled for RIGHT BREAST MOUND REVISION WITH FAT GRAFTING, LIPOSUCTION OF BILATERAL FLANKS,ABDOMINAL SCAR REVISION WITH EXCISION OF LATERAL ABSOMINAL TISSUE EXCESS, LEFT NIPPLE RECONSTRUCTION WITH CV FLAPS (Bilateral)Pt denies new complainsPast Surgical History:  Past Surgical History:11/17/2022:  BREAST MASTECTOMY LEFT SENTIENL LYMPH NODE BIOPSY; [id number]: BREAST SURGERY    Comment:  Left breat lumpectomy2008: THYROID SURGERY    Comment:  Tyroid removedNo reported anesthesia complicationsCardiovascular: Patient has a history of: hypertension.   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 Pt was started on Cardizem 120mg  once daily. Holter monitor after starting the diltizem (07/14/19) revealed PVCs consistent with 0.8% of the total QRS complexes..  . 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).Reproductive disorders:  Patient has breast neoplasm.-GI Comments: h/o L breast DCIS with recent recurrence s/p recent L mastectomy with fat transferEndocrine/Metabolic: -Thyroid Disorders:  Patient has hypothyroidism.Behavioral/Social/Psychiatric & Syndromes: NegativeAdditional Findings: ------------------------         - 05/27/23  --         -   1049    ------------------------- BP:     -(!) 155/107-- Resp:   -           -- Temp:   -           ------------------------Physical ExamCardiovascular:    normal exam  Rhythm: regularHeart Sounds: S1 present and S2 present.Pulmonary:  normal exam  Patient's breath sounds clear to auscultationAirway:  Mallampati: IITM distance: >3 FBNeck ROM: fullMouth Opening: >3cmDental:  unremarkable  Dentition: missingOther Findings: 20 G PIV R handAnesthesia PlanASA 2 The primary anesthesia plan is  general ETT. Standard ASA monitorsPONV ProphylaxisPerioperative Code Status confirmed: It is my understanding that the patient is currently designated as 'Full Code' and will remain so throughout the perioperative period.Anesthesia informed consent obtained. Consent obtained from: patientUse of blood products: consented  The post operative pain plan is IV analgesics and per surgeon management.Opioid administration likely.Plan discussed with Attending.Anesthesiologist's Pre Op NoteI personally evaluated and examined the patient prior to the intra-operative phase of care on the day of the procedure.Marland Kitchen

## 2023-05-27 NOTE — Other
 PLASTIC AND RECONSTRUCTIVE SURGERY PRE-OPERATIVE HISTORY AND PHYSICALShawn Khan is a 53 y.o. female with a history of DIEP reconstruction who presents for left breast mound revision with fat grafting, liposuction to bilateral flanks, abdominal scar revision with excision of lateral abdominal tissue excess, left nipple reconstruction with CV flap. There have been no changes to the H&P since she was last seen.PMH:  has a past medical history of Breast cancer (HC Code), Hypertension, Hypothyroidism, Motion sickness, and Thyroid cancer (HC Code).PSH:  has a past surgical history that includes Breast surgery (2015); Thyroid surgery (2008); and  BREAST MASTECTOMY LEFT SENTIENL LYMPH NODE BIOPSY (Left, 11/17/2022).Meds: No current facility-administered medications on file prior to encounter. Current Outpatient Medications on File Prior to Encounter Medication Sig Dispense Refill  acetaminophen (TYLENOL) 325 mg tablet Take 2 tablets (650 mg total) by mouth every 6 (six) hours. 30 tablet 0  dilTIAZem CD (CARDIZEM CD) 120 mg 24 hr capsule 1 capsule (120 mg total) every morning.    levothyroxine (SYNTHROID) 137 MCG tablet Take 1 tablet (137 mcg total) by mouth.    polyethylene glycol (MIRALAX) 17 gram packet Take 1 packet (17 g total) by mouth nightly as needed for constipation (Constipation). Mix in 8 ounces of water, juice, soda, coffee or tea prior to taking. (Patient not taking: Reported on 05/24/2023)   All:  No Known AllergiesSH:  reports that she has never smoked. She has never used smokeless tobacco. She reports current alcohol use of about 2.0 standard drinks of alcohol per week. She reports that she does not use drugs.FH:  family history includes Breast cancer in her maternal aunt and paternal grandmother; Early death in her brother.BP (!) 155/107  - Temp 98 ?F (36.7 ?C) (Temporal)  - Resp 18  - Ht 5' 6 (1.676 m)  - Wt 88.5 kg  - BMI 31.47 kg/m? NAD, AAOx3Unlabored respirations on RAS1, S2 presentBilateral breast marking 53 y.o. female with plan for left breast mound revision with fat grafting, liposuction to bilateral flanks, abdominal scar revision with excision of lateral abdominal tissue excess, left nipple reconstruction with CV flap as scheduled. No contraindications to proceeding to OR as plannedSurgical site markingAnesthesia pre-procedure evaluationRisks and benefits discussed with patient, who expresses understanding; informed consent signedPlan to discharge home post-operatively [facility], MDPlastic and Reconstructive Surgery - [id number] [phone] 05/27/2023

## 2023-05-27 NOTE — Brief Op Note
 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.3] Procedure(s) and Anesthesia Type:   * RIGHT BREAST MOUND REVISION WITH FAT GRAFTING, LIPOSUCTION OF BILATERAL FLANKS,ABDOMINAL SCAR REVISION WITH EXCISION OF LATERAL ABSOMINAL TISSUE EXCESS, LEFT NIPPLE RECONSTRUCTION WITH CV FLAPS - GENERAL   * PR GRAFTING OF AUTOLOGOUS FAT BY LIPO 50 CC OR LESS - GENERAL   * GRAFTING OF AUTOLOGOUS FAT BY LIPO EA ADDL 50 CC - GENERAL   * ADJACENT TISSUE TRANSFER/REARRANGEMENT, > 30 SQ CM, UNUSUAL/COMPLICATED, ANY AREA - GENERAL   * ADJ TISS XFER ANY AREA,EA ADD 30.0 SQCM - GENERALOperative Findings (enter relevant operative findings; do not refer to an operative report that is not yet transcribed): As above Signs of infection present at the time of surgery at the operative site: None Blood and Blood Products: none                 Drains:  noneImplants: * No implants in log * Specimens: * No specimens in log * Clinical Staging: N/a EBL: Minimal      Post Operative Diagnosis: Hx of breast cancer [342395] Joley Utecht, MD2/6/20253:24 PM

## 2023-05-27 NOTE — Discharge Instructions
 Tylenol and motrin for pain control. Tramadol for breakthrough pain. Keep left breast dressing on for two weeks, until follow-up. If dressing is soiled, call our office. Can take abdominal dressing down in 48 hours. Can shower after, pat incision dry, let the steri-strips (small white paper tapes) fall off on their own. Abdominal binder on at all times. No heavy lifting. Call office with any questions or concerns. Our office will call you to arrange 2-week follow-up visit with you.

## 2023-05-28 NOTE — Other
 Patient Name: Erika Khan Date of Surgery: 05/27/2023 Pre-operative diagnosis:Breast CancerAcquired absence of left breast Post-operative diagnosis: Breast cancerAcquired absence of left breast Operative Procedure:Left nipple reconstruction (19350)Fat grafting to the left breast 97cc total (15771)Revision of abdominal scar and dog ear excision (each 15cm) x3 (16109) Surgeon: Karma Greaser, MD Assistant: Janace Litten, MD Anesthesia: GETAAnesthesiologist: Wilhemina Bonito, MD Indications for Procedure: This is a 53 year old patient with a history of left breast cancer s/p left mastectomy, DIEP reconstruction who presents wit left nipple reconstruction, scar revision and abdominal dog ear excision. Full informed consent was obtained prior to the procedure, including a discussion of risks and benefits. Specific complications include but are not limited to bleeding, infection, seroma, or wound healing problems, scarring, asymmetry, and pain. All questions were answered to the patient's satisfaction and she agreed to proceed. Patient was marked in the pre-operative area and a block was performed by the anesthesia team which was well-tolerated by the patient.   Operative Procedure:After consent was obtained, patient was taken to the operating room and placed in the supine position and adequately padded. An appropriate time out was performed confirming the correct patient, site, and laterality. The patient received pre-operative dose of antibiotics and heparin. Sequential compression devices were applied to bilateral lower extremities and confirmed to be working. The patient was then prepped and draped in the usual sterile fashion.  I first started wit left nipple reconstruction. I raised the C-V flap in the subcutaneous plane. Skin edges were approximated wit 5-0 nylon and 4-0 chromic. I then turned my attention to the abdomen. A small incision was made on the lateral aspects of the abdominal incision usign a 15 blade. Tumescent solution was injected into the abdominal wall and bilateral flank (250cc at each site). Tumescent solution was composed of 1L of LR with 1amp of epinephrine. A minimum of 15 min was allowed to elapse. Fat was harvested from bilateral flanks using a power assisted liposuction and a 4mm canula. A total of 300cc of lipoaspirate was removed of which 100cc was fat. This was washed with the resolve system using LR. The fat was then injected in the left breast in small aliquots through two small incisions on the breast. A total of 100cc was injected to all four quadrants. The fat grafting injection sites were closed with 5-0 fast gut.  The right and left lateral abdominal incision's dog ear was marked as an elliptical incision. Starting on the right, the markings were incised sharply with a 15 blade, dissection was taken down to the subcutaneous fat with a electrocautery and the excess tissue was remove and discarded. Hemostasis was achieved with electrocautery. The area measured 15x3cm. The area was irrigated. The skin was then closed in layers with 3-0 vicryl in the deep dermis followed by a 3-0 monocryl subcuticular running stitch. Turning our attention to the left lateral abdominal incision, we repeated the same steps and to correct the dog ear. The area of skin removed was approximate 15x3cm. Lastly, I performed a scar revision to the mid-abdomen area. It measured 13cm. Dermis was closed wit 3-0 vicryl followed by a 3-0 monocryl subcuticular running stitch.   Left nipple reconstruction was dressed with eye patch guaze and tegaderm. Abdominal incisions were dressed with steri-strips, 4x4 gauze and tegaderm. The patient was then placed in an abdominal compression garment. Patient tolerated procedure without complications and was extubated and transferred to the PACU in stable condition. Closure & Presence Statement-Sponge count: correct-Instrument count: correct-Needle count: correct Specimens: none  Drains: none Estimated blood loss: 15 cc Complications: None Disposition: PACU to floor when criteria metI, Dr. Cedric Fishman was present and scrubbed for the entire duration of the plastic surgery portion of the procedure

## 2023-06-04 ENCOUNTER — Ambulatory Visit: Admit: 2023-06-04 | Payer: PRIVATE HEALTH INSURANCE | Attending: Cardiovascular Disease | Primary: Family

## 2023-06-04 VITALS — BP 146/91 | HR 78 | Temp 97.50000°F | Resp 20 | Wt 195.1 lb

## 2023-06-04 DIAGNOSIS — Z9012 Acquired absence of left breast and nipple: Principal | ICD-10-CM

## 2023-06-07 ENCOUNTER — Encounter: Admit: 2023-06-07 | Payer: PRIVATE HEALTH INSURANCE | Attending: Cardiovascular Disease | Primary: Family

## 2023-06-07 DIAGNOSIS — I10 Essential (primary) hypertension: Secondary | ICD-10-CM

## 2023-06-07 DIAGNOSIS — C73 Malignant neoplasm of thyroid gland: Secondary | ICD-10-CM

## 2023-06-07 DIAGNOSIS — C50919 Malignant neoplasm of unspecified site of unspecified female breast: Secondary | ICD-10-CM

## 2023-06-07 DIAGNOSIS — T753XXA Motion sickness, initial encounter: Secondary | ICD-10-CM

## 2023-06-07 DIAGNOSIS — E039 Hypothyroidism, unspecified: Secondary | ICD-10-CM

## 2023-06-07 NOTE — Progress Notes
 POST OPERATIVE VISIT:Erika Reid[birth year]Post-operative visitPhysician: Dr. Radene Journey OF PRESENT ILLNESS:Erika Khan is a 53 y.o. patient who presents for post-operative examination one week days s/p revision of breast reconstruction with left nipple reconstruction and fat grafting to the left breast, as well as revision of the abdominal scar and excision of cone deformities on 05/27/23. She denies any significant complaints and pain has been well controlled on an oral pain medicine regimen. She is able to perform activities of daily living with minimal effort. Patient denies any recent, fevers, chills, nausea/vomiting, or headache. Patient is tolerating a regular diet and is bathing as normal. Patient is pleased with their surgical result.FOCUSED EXAM:BP (!) 146/91  - Pulse 78  - Temp 97.5 ?F (36.4 ?C) (Temporal)  - Resp 20  - Wt 88.5 kg  - SpO2 97%  - BMI 31.49 kg/m? Physical ExamGeneral: well appearing female in NAD.Neuro: alert, awake, appropriate; no gross deficit.Breast: Good contour and symmetry. Soft, non-tender throughout. Incisions are healing well. There is no evidence of hematoma, seroma, or infection. Slight ecchymosis on left breast. Sutures to left nipple in place.Abdomen: Good contour and symmetry. Soft, non-tender throughout. Incisions are healing well with some steri-strips maintained (others have fallen off). There is no evidence of hematoma, seroma, or infection.Patient exam or treatment required medical chaperone.The sensitive parts of the examination were performed with chaperone present: Yes; Chaperone Name, Role/Title: Myrene Buddy, ACAASSESSMENT AND PLAN:Erika Khan is a 53 y.o. patient who presents for post-operative examination one week days s/p revision of breast reconstruction with left nipple reconstruction and fat grafting to the left breast, as well as revision of the abdominal scar and excision of cone deformities on 05/27/23. Patient is doing well overall. The patient should continue their current wound care regimen and prescribed medications. I gave explicit instructions regarding signs and symptoms of infection and the patient knows to call with any questions or problems.  The patient was given the following instructions:- tylenol/ibuprofin PRN- no submerging in water until incisions fully healed- no restrictions with ROM - ok to go against tightness, but not against pain- ok to wash hair, no reaching to high shelves until 3 weeks post-op- no strenuous activity until 4 weeks post-op. - No under-wire bra for 2 months post-op. Continue to wear bra at night until 2 weeks post-op.- continue to sleep on back until 2 weeks post-op- sutures to stay in place until 3 weeks post-op- continue to wear abdominal binder day and night until 2 weeks post-op, and then as comfortable- follow up in 2 weeks for suture removal, or sooner if any questions or Kelle Darting, APRN

## 2023-06-18 ENCOUNTER — Encounter: Admit: 2023-06-18 | Payer: PRIVATE HEALTH INSURANCE | Attending: Cardiovascular Disease | Primary: Family

## 2023-06-18 ENCOUNTER — Ambulatory Visit: Admit: 2023-06-18 | Payer: PRIVATE HEALTH INSURANCE | Attending: Cardiovascular Disease | Primary: Family

## 2023-06-18 VITALS — BP 145/93 | HR 79 | Temp 97.40000°F | Resp 19 | Wt 195.0 lb

## 2023-06-18 DIAGNOSIS — E039 Hypothyroidism, unspecified: Secondary | ICD-10-CM

## 2023-06-18 DIAGNOSIS — I10 Essential (primary) hypertension: Secondary | ICD-10-CM

## 2023-06-18 DIAGNOSIS — C50919 Malignant neoplasm of unspecified site of unspecified female breast: Secondary | ICD-10-CM

## 2023-06-18 DIAGNOSIS — C73 Malignant neoplasm of thyroid gland: Secondary | ICD-10-CM

## 2023-06-18 DIAGNOSIS — T753XXA Motion sickness, initial encounter: Secondary | ICD-10-CM

## 2023-06-18 DIAGNOSIS — Z9012 Acquired absence of left breast and nipple: Principal | ICD-10-CM

## 2023-06-18 NOTE — Progress Notes
 POST OPERATIVE VISIT:Erika Reid[birth year]Post-operative visitPhysician: Dr. Radene Journey OF PRESENT ILLNESS:Erika Khan is a 53 y.o. patient who presents for post-operative examination 3 weeks s/p revision of breast reconstruction with left nipple reconstruction and fat grafting to the left breast, as well as revision of the abdominal scar and excision of cone deformities on 05/27/23. She denies any significant complaints and pain has been well controlled on an oral pain medicine regimen. She is able to perform activities of daily living with minimal effort. Patient denies any recent, fevers, chills, nausea/vomiting, or headache. Patient is tolerating a regular diet and is bathing as normal. Patient remains pleased with their surgical result.FOCUSED EXAM:BP (!) 145/93  - Pulse 79  - Temp 97.4 ?F (36.3 ?C) (Temporal)  - Resp 19  - Wt 88.5 kg  - BMI 31.47 kg/m? Physical ExamGeneral: well appearing female in NAD.Neuro: alert, awake, appropriate; no gross deficit.Breast: Good contour and symmetry. Soft, non-tender throughout. Incisions are healing well. There is no evidence of hematoma, seroma, or infection. Slight ecchymosis on left breast. Sutures to left reconstructed nipple in place.Abdomen: Good contour and symmetry. Soft, non-tender throughout. Incisions are healing well. There is no evidence of hematoma, seroma, or infection.Patient exam or treatment required medical chaperone.The sensitive parts of the examination were performed with chaperone present: Yes; Chaperone Name, Role/Title: Myrene Buddy, ACAASSESSMENT AND PLAN:Erika Khan is a 53 y.o. patient who presents for post-operative examination one week days s/p revision of breast reconstruction with left nipple reconstruction and fat grafting to the left breast, as well as revision of the abdominal scar and excision of cone deformities on 05/27/23. Patient is doing well overall. I removed the sutures without difficulty and applied bacitracin. I advised to use bacitracin daily for one week to reconstructed nipple. The patient should continue their current wound care regimen and prescribed medications. I gave explicit instructions regarding signs and symptoms of infection and the patient knows to call with any questions or problems. We also had a lengthy discussion of the NAC tattoo process. I advised the tattoo is done outpatient in clinic. I will work to create a 3D NAC to match her native right breast NAC as closely as possible. We discussed risks and benefits of the tattoo. Risks include infection, bleeding, scarring, scabbing, delayed wound healing, dissatisfaction of aesthetic result, need for further tattoo and fading. I discussed all tattoos will fade over time. We discussed the post tattoo care and restrictions. The patient was given the following instructions:- tylenol/ibuprofin PRN- no submerging in water until incisions fully healed- no restrictions with ROM - ok to go against tightness, but not against pain- no strenuous activity until 4 weeks post-op. - No under-wire bra for 2 months post-op. -Scar care: Please perform once daily gentle scar massage using either lotion that contains Mederma, Vit. E or Biocorneum. We believe this is helpful for the first 6 months after surgery. You should perform once a day for one minute daily for 60 days. OK to begin this now to healed areas and to reconstructed nipple incision in one week.- SPF 50 to incisions when in the sun for the first year, must put underneath bathing suit.- follow up with Dr. Cedric Fishman once 3 months post-op and with me for NAC tattoo after she is 3 months post-op, or sooner if any questions or Kelle Darting, APRN

## 2023-08-10 ENCOUNTER — Other Ambulatory Visit: Payer: Self-pay | Admitting: Orthopaedic Surgery

## 2023-08-10 DIAGNOSIS — M5416 Radiculopathy, lumbar region: Secondary | ICD-10-CM

## 2023-08-19 ENCOUNTER — Encounter: Payer: Self-pay | Admitting: Orthopaedic Surgery

## 2023-08-21 ENCOUNTER — Other Ambulatory Visit

## 2023-09-17 ENCOUNTER — Ambulatory Visit: Admit: 2023-09-17 | Payer: PRIVATE HEALTH INSURANCE | Attending: Plastic Surgery | Primary: Family

## 2023-12-23 ENCOUNTER — Ambulatory Visit: Admit: 2023-12-23 | Payer: PRIVATE HEALTH INSURANCE | Attending: Cardiovascular Disease | Primary: Family

## 2023-12-23 ENCOUNTER — Encounter: Admit: 2023-12-23 | Payer: PRIVATE HEALTH INSURANCE | Attending: Cardiovascular Disease | Primary: Family

## 2023-12-23 VITALS — BP 129/84 | HR 81 | Ht 66.0 in | Wt 192.0 lb

## 2023-12-23 DIAGNOSIS — I10 Essential (primary) hypertension: Secondary | ICD-10-CM

## 2023-12-23 DIAGNOSIS — E039 Hypothyroidism, unspecified: Secondary | ICD-10-CM

## 2023-12-23 DIAGNOSIS — Z9012 Acquired absence of left breast and nipple: Principal | ICD-10-CM

## 2023-12-23 DIAGNOSIS — T753XXA Motion sickness, initial encounter: Secondary | ICD-10-CM

## 2023-12-23 DIAGNOSIS — L818 Other specified disorders of pigmentation: Secondary | ICD-10-CM

## 2023-12-23 DIAGNOSIS — C50919 Malignant neoplasm of unspecified site of unspecified female breast: Principal | ICD-10-CM

## 2023-12-23 DIAGNOSIS — C73 Malignant neoplasm of thyroid gland: Secondary | ICD-10-CM

## 2023-12-23 NOTE — Procedures
 DATE OF PROCEDURE: 09/04/25PROCEDURE: Left breast nipple areolae tattooing with the diameter of the areola measuring 42 mm and diameter of the nipple measuring 12.68mm. 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 SPECIMENS: None. PROCEDURE: This is a 53 year old patient with history of breast cancer. This patient was referred to me by Dr. Azalea patient was brought to clinic. The procedure was discussed along with risk of color fading, scar formation, scabbing, peeling of skin, bleeding, infection, bruising and possible need to return for further pigmentation. Patient verbalized her understanding and agreed to continue with the procedure. Consent was obtained. Outlines for areola were marked. Patient confirmed that she liked the shape, size and position of areolae. The patient was placed in a supine position. The chest was prepped with chlorhexidine . No lidocaine  was needed as the patient was insensate. Using the [facility] tattoo system, a 9 point magnum (LOT # [id number]; exp 02/29) and 5 point slope (LOT# [id number]; exp: 09/28) needles were used for the areola. The colors for the areola were: Areola 7 (LOT# [id number]; exp: 12/35), (2 drops), Areola 9 (LOT # [id number]; exp 12/35) (3 drops); and White ([id number]; exp:12/32) (1 drop).  The areolae were tattooed with introduction of the pigment into the dermis. The needle used for the nipple was a 3 point liner (LOT# [id number]; exp 03/29). The color Areola 9 (LOT # [id number]; exp 12/35) (2 drops) and Almost Black  (LOT # [id number]; exp 12/31) (2 drops) was utilized to tattoo the projected nipple. I made sure that the pigment was evenly distributed and once achieved. I then applied bacitracin and Telfa. The patient had scant dermal bleeding. The patient tolerated the procedure well without any complication and was very happy with her results. Before Tattoo:						After Tattoo:A total of 20 sq cm was tattooed. Pt was given post op instructions and will arrange a follow up in 4-6 weeks, or sooner if any questions or concerns.Patient exam or treatment required medical chaperone.The sensitive parts of the examination were performed with chaperone present: Yes; Chaperone Name, Role/Title: Magalene KATHEE Christy Chales, APRNPlastic and Reconstructive [phone]

## 2023-12-23 NOTE — Progress Notes
 Review of Systems Constitutional: Negative.  HENT: Negative.   Eyes: Negative.  Respiratory: Negative.   Cardiovascular: Negative.  Gastrointestinal: Negative.  Genitourinary: Negative.  Musculoskeletal: Negative.  Skin:       Pt present for a unilateral nipple tattoo. No pain reported. Neurological: Negative.  Endo/Heme/Allergies: Negative.

## 2024-02-03 ENCOUNTER — Encounter: Admit: 2024-02-03 | Payer: PRIVATE HEALTH INSURANCE | Attending: Cardiovascular Disease | Primary: Family

## 2024-02-04 ENCOUNTER — Ambulatory Visit: Admit: 2024-02-04 | Payer: PRIVATE HEALTH INSURANCE | Attending: Cardiovascular Disease | Primary: Family

## 2024-10-05 ENCOUNTER — Encounter: Admit: 2024-10-05 | Payer: PRIVATE HEALTH INSURANCE | Attending: Cardiovascular Disease | Primary: Family
